# Patient Record
Sex: Female | Born: 1945 | Race: Black or African American | Hispanic: No | Marital: Married | Smoking: Never smoker
Health system: Southern US, Community
[De-identification: ages and names within clinical notes are randomized; demographics above are authoritative.]

## PROBLEM LIST (undated history)

## (undated) DIAGNOSIS — I10 Essential (primary) hypertension: Secondary | ICD-10-CM

## (undated) DIAGNOSIS — D649 Anemia, unspecified: Secondary | ICD-10-CM

## (undated) DIAGNOSIS — E7800 Pure hypercholesterolemia, unspecified: Secondary | ICD-10-CM

## (undated) DIAGNOSIS — C189 Malignant neoplasm of colon, unspecified: Secondary | ICD-10-CM

## (undated) HISTORY — PX: TONSILLECTOMY: SUR1361

## (undated) HISTORY — PX: TUBAL LIGATION: SHX77

---

## 1999-05-05 ENCOUNTER — Other Ambulatory Visit: Admission: RE | Admit: 1999-05-05 | Discharge: 1999-05-05 | Payer: Self-pay | Admitting: Obstetrics and Gynecology

## 2004-04-19 ENCOUNTER — Emergency Department (HOSPITAL_COMMUNITY): Admission: EM | Admit: 2004-04-19 | Discharge: 2004-04-19 | Payer: Self-pay | Admitting: Emergency Medicine

## 2009-02-11 ENCOUNTER — Emergency Department (HOSPITAL_COMMUNITY): Admission: EM | Admit: 2009-02-11 | Discharge: 2009-02-11 | Payer: Self-pay | Admitting: Emergency Medicine

## 2010-02-12 ENCOUNTER — Encounter: Admission: RE | Admit: 2010-02-12 | Discharge: 2010-02-12 | Payer: Self-pay | Admitting: Internal Medicine

## 2010-08-17 ENCOUNTER — Encounter: Payer: Self-pay | Admitting: Internal Medicine

## 2011-03-26 ENCOUNTER — Other Ambulatory Visit (HOSPITAL_COMMUNITY)
Admission: RE | Admit: 2011-03-26 | Discharge: 2011-03-26 | Disposition: A | Discharge status: Home / Self Care | Payer: BC Managed Care – PPO | Source: Ambulatory Visit | Attending: Obstetrics and Gynecology | Admitting: Obstetrics and Gynecology

## 2011-03-26 DIAGNOSIS — Z01419 Encounter for gynecological examination (general) (routine) without abnormal findings: Secondary | ICD-10-CM | POA: Insufficient documentation

## 2011-04-03 ENCOUNTER — Other Ambulatory Visit: Payer: Self-pay | Admitting: Obstetrics and Gynecology

## 2011-04-03 DIAGNOSIS — Z1231 Encounter for screening mammogram for malignant neoplasm of breast: Secondary | ICD-10-CM

## 2011-04-29 ENCOUNTER — Ambulatory Visit
Admission: RE | Admit: 2011-04-29 | Discharge: 2011-04-29 | Disposition: A | Discharge status: Home / Self Care | Payer: BC Managed Care – PPO | Source: Ambulatory Visit | Attending: Obstetrics and Gynecology | Admitting: Obstetrics and Gynecology

## 2011-04-29 DIAGNOSIS — Z1231 Encounter for screening mammogram for malignant neoplasm of breast: Secondary | ICD-10-CM

## 2012-12-28 ENCOUNTER — Emergency Department (HOSPITAL_COMMUNITY): Payer: Self-pay

## 2012-12-28 ENCOUNTER — Emergency Department (HOSPITAL_COMMUNITY)
Admission: EM | Admit: 2012-12-28 | Discharge: 2012-12-28 | Disposition: A | Discharge status: Home / Self Care | Payer: Self-pay | Attending: Emergency Medicine | Admitting: Emergency Medicine

## 2012-12-28 ENCOUNTER — Encounter (HOSPITAL_COMMUNITY): Payer: Self-pay | Admitting: Emergency Medicine

## 2012-12-28 DIAGNOSIS — Y9389 Activity, other specified: Secondary | ICD-10-CM | POA: Insufficient documentation

## 2012-12-28 DIAGNOSIS — W2203XA Walked into furniture, initial encounter: Secondary | ICD-10-CM | POA: Insufficient documentation

## 2012-12-28 DIAGNOSIS — M79672 Pain in left foot: Secondary | ICD-10-CM

## 2012-12-28 DIAGNOSIS — Y929 Unspecified place or not applicable: Secondary | ICD-10-CM | POA: Insufficient documentation

## 2012-12-28 DIAGNOSIS — S8990XA Unspecified injury of unspecified lower leg, initial encounter: Secondary | ICD-10-CM | POA: Insufficient documentation

## 2012-12-28 DIAGNOSIS — I10 Essential (primary) hypertension: Secondary | ICD-10-CM | POA: Insufficient documentation

## 2012-12-28 DIAGNOSIS — N289 Disorder of kidney and ureter, unspecified: Secondary | ICD-10-CM | POA: Insufficient documentation

## 2012-12-28 DIAGNOSIS — R42 Dizziness and giddiness: Secondary | ICD-10-CM | POA: Insufficient documentation

## 2012-12-28 DIAGNOSIS — Z79899 Other long term (current) drug therapy: Secondary | ICD-10-CM | POA: Insufficient documentation

## 2012-12-28 DIAGNOSIS — S99929A Unspecified injury of unspecified foot, initial encounter: Secondary | ICD-10-CM | POA: Insufficient documentation

## 2012-12-28 HISTORY — DX: Essential (primary) hypertension: I10

## 2012-12-28 LAB — CBC WITH DIFFERENTIAL/PLATELET
Basophils Absolute: 0.1 10*3/uL (ref 0.0–0.1)
Basophils Relative: 1 % (ref 0–1)
Eosinophils Absolute: 0.1 10*3/uL (ref 0.0–0.7)
Eosinophils Relative: 1 % (ref 0–5)
HCT: 32.9 % — ABNORMAL LOW (ref 36.0–46.0)
Lymphs Abs: 1.3 10*3/uL (ref 0.7–4.0)
MCH: 28.4 pg (ref 26.0–34.0)
MCHC: 33.4 g/dL (ref 30.0–36.0)
Monocytes Absolute: 0.8 10*3/uL (ref 0.1–1.0)
Neutro Abs: 7.8 10*3/uL — ABNORMAL HIGH (ref 1.7–7.7)
Neutrophils Relative %: 78 % — ABNORMAL HIGH (ref 43–77)
RDW: 13.7 % (ref 11.5–15.5)

## 2012-12-28 LAB — BASIC METABOLIC PANEL
Calcium: 9.3 mg/dL (ref 8.4–10.5)
Chloride: 98 mEq/L (ref 96–112)
GFR calc Af Amer: 20 mL/min — ABNORMAL LOW (ref >90)
GFR calc non Af Amer: 18 mL/min — ABNORMAL LOW (ref >90)
Glucose, Bld: 111 mg/dL — ABNORMAL HIGH (ref 70–99)
Sodium: 136 mEq/L (ref 135–145)

## 2012-12-28 MED ORDER — SODIUM CHLORIDE 0.9 % IV BOLUS (SEPSIS)
1000.0000 mL | Freq: Once | INTRAVENOUS | Status: AC
  Administered 2012-12-28: 1000 mL via INTRAVENOUS

## 2012-12-28 MED ORDER — HYDROCODONE-ACETAMINOPHEN 5-325 MG PO TABS: 1.0000 | ORAL_TABLET | Freq: Four times a day (QID) | ORAL | Status: DC | PRN

## 2012-12-28 NOTE — ED Provider Notes (Signed)
History    This chart was scribed for Benny Lennert, MD by ED Scribe, Burman Nieves. This patient was seen in room [facility] and the patient's care was started at 3:15 PM.   CSN: [id number]  Arrival date & time 12/28/12  1439   First MD Initiated Contact with Patient 12/28/12 1515      Chief Complaint  Patient presents with  . Leg Swelling  . Dizziness    (Consider location/radiation/quality/duration/timing/severity/associated sxs/prior treatment) Patient is a 67 y.o. female presenting with lower extremity pain. The history is provided by the patient (pt complains of pain and swelling both feet). No language interpreter was used.  Foot Pain This is a new problem. The current episode started more than 2 days ago. The problem occurs constantly. The problem has not changed since onset.Pertinent negatives include no chest pain, no abdominal pain and no headaches. Nothing aggravates the symptoms. Nothing relieves the symptoms.   HPI Comments: Lisa Guerra is a 67 y.o. female with h/o HTN who presents to the Emergency Department complaining of moderate bilateral pedal edema since Saturday. Pt reports that she hit her right foot chair resulting in it swelling up. Sunday, pt's left foot started to swell up. She feels like her feet are full fluid. Pt got out of the bathtub around 2 PM today and started to feel dizzy. She believes the dizziness onset is due to not eating anything today. Pt denies fever, chills, cough, nausea, vomiting, diarrhea, SOB, weakness, and any other associated symptoms.   Past Medical History  Diagnosis Date  . Hypertension     History reviewed. No pertinent past surgical history.  No family history on file.  History  Substance Use Topics  . Smoking status: Never Smoker   . Smokeless tobacco: Not on file  . Alcohol Use: No    OB History   Grav Para Term Preterm Abortions TAB SAB Ect Mult Living                  Review of Systems  Constitutional:  Negative for appetite change and fatigue.  HENT: Negative for congestion, sinus pressure and ear discharge.   Eyes: Negative for discharge.  Respiratory: Negative for cough.   Cardiovascular: Negative for chest pain.  Gastrointestinal: Negative for abdominal pain and diarrhea.  Genitourinary: Negative for frequency and hematuria.  Musculoskeletal: Negative for back pain.       Swelling of the feet  Skin: Negative for rash.  Neurological: Positive for dizziness. Negative for seizures and headaches.  Psychiatric/Behavioral: Negative for hallucinations.  All other systems reviewed and are negative.    Allergies  Review of patient's allergies indicates no known allergies.  Home Medications   Current Outpatient Rx  Name  Route  Sig  Dispense  Refill  . amLODipine (NORVASC) 5 MG tablet   Oral   Take 5 mg by mouth daily.         Marland Kitchen lisinopril-hydrochlorothiazide (PRINZIDE,ZESTORETIC) 20-12.5 MG per tablet   Oral   Take 1 tablet by mouth daily.           BP 105/73  Pulse 94  Temp(Src) 97.6 F (36.4 C) (Oral)  Resp 17  Ht 5\' 6"  (1.676 m)  Wt 133 lb (60.328 kg)  BMI 21.48 kg/m2  SpO2 100%  Physical Exam  Nursing note and vitals reviewed. Constitutional: She is oriented to person, place, and time. She appears well-developed.  HENT:  Head: Normocephalic.  Eyes: Conjunctivae and EOM are normal. No scleral  icterus.  Neck: Neck supple. No tracheal deviation present. No thyromegaly present.  Cardiovascular: Normal rate and regular rhythm.  Exam reveals no gallop and no friction rub.   No murmur heard. Pulmonary/Chest: No stridor. She has no wheezes. She has no rales. She exhibits no tenderness.  Abdominal: She exhibits no distension. There is no tenderness. There is no rebound.  Musculoskeletal: Normal range of motion. She exhibits edema and tenderness.  Swelling bilaterally in feet. Left foot tender upon palpation to the dorsal aspect.  Lymphadenopathy:    She has no  cervical adenopathy.  Neurological: She is oriented to person, place, and time. Coordination normal.  Skin: Skin is warm. No rash noted. No erythema.  Psychiatric: She has a normal mood and affect. Her behavior is normal.    ED Course  Procedures (including critical care time) DIAGNOSTIC STUDIES: Oxygen Saturation is 100% on room air, normal by my interpretation.    COORDINATION OF CARE: 3:20 PM Discussed ED treatment with pt and pt agrees.     Labs Reviewed - No data to display No results found.   No diagnosis found.    MDM       .jk The chart was scribed for me under my direct supervision.  I personally performed the history, physical, and medical decision making and all procedures in the evaluation of this patient.Benny Lennert, MD 12/28/12 [id number]

## 2012-12-28 NOTE — ED Notes (Signed)
Pt c/o bilateral pedal edema since Saturday. Pt also c/o dizziness today when getting out of the bathtub at 1400. Denies dizziness at this time. Denies cp/sob. nad noted.

## 2012-12-28 NOTE — ED Notes (Addendum)
Pt reports accidentally kicked a chair with the side of her r foot Friday.  Reports has been walking on crutches and noticed her left foot started swelling also. Left foot appears swollen on the top of her foot , the top of r foot and r ankle swollen.  Pedal pulses present.  Also reports one episode of dizziness this morning after getting out of the shower.  Denies dizziness now.

## 2014-08-27 ENCOUNTER — Other Ambulatory Visit: Payer: Self-pay | Admitting: Internal Medicine

## 2014-08-27 DIAGNOSIS — Z1231 Encounter for screening mammogram for malignant neoplasm of breast: Secondary | ICD-10-CM

## 2014-09-11 ENCOUNTER — Ambulatory Visit: Payer: Self-pay

## 2014-09-19 ENCOUNTER — Ambulatory Visit: Payer: Self-pay

## 2014-09-26 ENCOUNTER — Ambulatory Visit: Payer: Self-pay

## 2015-10-03 ENCOUNTER — Other Ambulatory Visit: Payer: Self-pay | Admitting: Gastroenterology

## 2015-10-09 ENCOUNTER — Encounter (HOSPITAL_COMMUNITY): Payer: Self-pay | Admitting: *Deleted

## 2015-10-14 ENCOUNTER — Encounter (HOSPITAL_COMMUNITY): Payer: Self-pay | Admitting: Certified Registered Nurse Anesthetist

## 2015-10-15 ENCOUNTER — Ambulatory Visit (HOSPITAL_COMMUNITY): Admission: RE | Admit: 2015-10-15 | Payer: Medicare Other | Source: Ambulatory Visit | Admitting: Gastroenterology

## 2015-10-15 SURGERY — COLONOSCOPY WITH PROPOFOL
Anesthesia: Monitor Anesthesia Care

## 2015-10-15 MED ORDER — LIDOCAINE HCL (CARDIAC) 20 MG/ML IV SOLN
INTRAVENOUS | Status: AC
  Filled 2015-10-15: qty 5

## 2015-10-15 MED ORDER — ONDANSETRON HCL 4 MG/2ML IJ SOLN
INTRAMUSCULAR | Status: AC
  Filled 2015-10-15: qty 2

## 2015-10-15 MED ORDER — PROPOFOL 10 MG/ML IV BOLUS
INTRAVENOUS | Status: AC
  Filled 2015-10-15: qty 60

## 2015-10-15 NOTE — Anesthesia Preprocedure Evaluation (Deleted)
Anesthesia Evaluation  Patient identified by MRN, date of birth, ID band Patient awake    Reviewed: Allergy & Precautions, NPO status , Patient's Chart, lab work & pertinent test results  Airway        Dental   Pulmonary neg pulmonary ROS,           Cardiovascular hypertension, Pt. on medications      Neuro/Psych negative neurological ROS  negative psych ROS   GI/Hepatic negative GI ROS, Neg liver ROS,   Endo/Other  negative endocrine ROS  Renal/GU negative Renal ROS  negative genitourinary   Musculoskeletal negative musculoskeletal ROS (+)   Abdominal   Peds negative pediatric ROS (+)  Hematology negative hematology ROS (+)   Anesthesia Other Findings   Reproductive/Obstetrics negative OB ROS                             Lab Results  Component Value Date   WBC 10.0 12/28/2012   HGB 11.0* 12/28/2012   HCT 32.9* 12/28/2012   MCV 85.0 12/28/2012   PLT 343 12/28/2012   Lab Results  Component Value Date   CREATININE 2.69* 12/28/2012   BUN 56* 12/28/2012   NA 136 12/28/2012   K 3.7 12/28/2012   CL 98 12/28/2012   CO2 24 12/28/2012     Anesthesia Physical Anesthesia Plan  ASA: II  Anesthesia Plan: MAC   Post-op Pain Management:    Induction: Intravenous  Airway Management Planned: Natural Airway and Simple Face Mask  Additional Equipment:   Intra-op Plan:   Post-operative Plan:   Informed Consent: I have reviewed the patients History and Physical, chart, labs and discussed the procedure including the risks, benefits and alternatives for the proposed anesthesia with the patient or authorized representative who has indicated his/her understanding and acceptance.   Dental advisory given  Plan Discussed with: CRNA  Anesthesia Plan Comments:         Anesthesia Quick Evaluation

## 2016-02-05 ENCOUNTER — Other Ambulatory Visit: Payer: Self-pay | Admitting: Gastroenterology

## 2016-03-23 ENCOUNTER — Other Ambulatory Visit: Payer: Self-pay | Admitting: Gastroenterology

## 2016-03-23 NOTE — Progress Notes (Signed)
Attempted to contact pt x 4 times, pt did not call back for pre op medical history and instructions

## 2016-03-24 ENCOUNTER — Ambulatory Visit (HOSPITAL_COMMUNITY): Admission: RE | Admit: 2016-03-24 | Payer: Medicare Other | Source: Ambulatory Visit | Admitting: Gastroenterology

## 2016-03-24 ENCOUNTER — Encounter (HOSPITAL_COMMUNITY): Admission: RE | Payer: Self-pay | Source: Ambulatory Visit

## 2016-03-24 ENCOUNTER — Encounter (HOSPITAL_COMMUNITY): Payer: Self-pay | Admitting: Anesthesiology

## 2016-03-24 SURGERY — COLONOSCOPY WITH PROPOFOL
Anesthesia: Monitor Anesthesia Care

## 2016-03-24 NOTE — Anesthesia Preprocedure Evaluation (Deleted)
Anesthesia Evaluation  Patient identified by MRN, date of birth, ID band Patient awake    Reviewed: Allergy & Precautions, NPO status , Patient's Chart, lab work & pertinent test results  Airway        Dental   Pulmonary neg pulmonary ROS,           Cardiovascular hypertension, Pt. on medications      Neuro/Psych negative neurological ROS  negative psych ROS   GI/Hepatic negative GI ROS, Neg liver ROS,   Endo/Other  negative endocrine ROS  Renal/GU negative Renal ROS  negative genitourinary   Musculoskeletal negative musculoskeletal ROS (+)   Abdominal   Peds negative pediatric ROS (+)  Hematology negative hematology ROS (+)   Anesthesia Other Findings   Reproductive/Obstetrics negative OB ROS                             Lab Results  Component Value Date   WBC 10.0 12/28/2012   HGB 11.0 (L) 12/28/2012   HCT 32.9 (L) 12/28/2012   MCV 85.0 12/28/2012   PLT 343 12/28/2012   Lab Results  Component Value Date   CREATININE 2.69 (H) 12/28/2012   BUN 56 (H) 12/28/2012   NA 136 12/28/2012   K 3.7 12/28/2012   CL 98 12/28/2012   CO2 24 12/28/2012     Anesthesia Physical  Anesthesia Plan  ASA: II  Anesthesia Plan: MAC   Post-op Pain Management:    Induction: Intravenous  Airway Management Planned: Simple Face Mask  Additional Equipment:   Intra-op Plan:   Post-operative Plan:   Informed Consent: I have reviewed the patients History and Physical, chart, labs and discussed the procedure including the risks, benefits and alternatives for the proposed anesthesia with the patient or authorized representative who has indicated his/her understanding and acceptance.   Dental advisory given  Plan Discussed with: CRNA  Anesthesia Plan Comments:         Anesthesia Quick Evaluation

## 2016-05-05 ENCOUNTER — Ambulatory Visit (HOSPITAL_COMMUNITY): Admission: RE | Admit: 2016-05-05 | Payer: Medicare Other | Source: Ambulatory Visit | Admitting: Gastroenterology

## 2016-05-05 ENCOUNTER — Encounter (HOSPITAL_COMMUNITY): Admission: RE | Payer: Self-pay | Source: Ambulatory Visit

## 2016-05-05 SURGERY — COLONOSCOPY WITH PROPOFOL
Anesthesia: Monitor Anesthesia Care

## 2016-05-05 MED ORDER — PROPOFOL 10 MG/ML IV BOLUS
INTRAVENOUS | Status: AC
  Filled 2016-05-05: qty 40

## 2016-05-05 MED ORDER — ONDANSETRON HCL 4 MG/2ML IJ SOLN
INTRAMUSCULAR | Status: AC
  Filled 2016-05-05: qty 2

## 2016-07-10 ENCOUNTER — Other Ambulatory Visit: Payer: Self-pay | Admitting: Internal Medicine

## 2016-07-10 DIAGNOSIS — Z1231 Encounter for screening mammogram for malignant neoplasm of breast: Secondary | ICD-10-CM

## 2016-08-17 ENCOUNTER — Ambulatory Visit: Payer: Medicare Other

## 2016-08-26 ENCOUNTER — Other Ambulatory Visit: Payer: Self-pay | Admitting: Gastroenterology

## 2016-08-26 ENCOUNTER — Ambulatory Visit
Admission: RE | Admit: 2016-08-26 | Discharge: 2016-08-26 | Disposition: A | Discharge status: Home / Self Care | Payer: Medicare Other | Source: Ambulatory Visit | Attending: Internal Medicine | Admitting: Internal Medicine

## 2016-08-26 DIAGNOSIS — Z1231 Encounter for screening mammogram for malignant neoplasm of breast: Secondary | ICD-10-CM

## 2016-09-03 ENCOUNTER — Encounter (HOSPITAL_COMMUNITY): Payer: Self-pay

## 2016-09-07 ENCOUNTER — Ambulatory Visit (HOSPITAL_COMMUNITY): Payer: Medicare Other | Admitting: Anesthesiology

## 2016-09-07 ENCOUNTER — Encounter (HOSPITAL_COMMUNITY)
Admission: RE | Disposition: A | Discharge status: Home / Self Care | Payer: Self-pay | Source: Ambulatory Visit | Attending: Gastroenterology

## 2016-09-07 ENCOUNTER — Other Ambulatory Visit (HOSPITAL_COMMUNITY): Payer: Self-pay | Admitting: Gastroenterology

## 2016-09-07 ENCOUNTER — Ambulatory Visit (HOSPITAL_COMMUNITY)
Admission: RE | Admit: 2016-09-07 | Discharge: 2016-09-07 | Disposition: A | Discharge status: Home / Self Care | Payer: Medicare Other | Source: Ambulatory Visit | Attending: Gastroenterology | Admitting: Gastroenterology

## 2016-09-07 ENCOUNTER — Encounter (HOSPITAL_COMMUNITY): Payer: Self-pay

## 2016-09-07 DIAGNOSIS — D125 Benign neoplasm of sigmoid colon: Secondary | ICD-10-CM | POA: Diagnosis not present

## 2016-09-07 DIAGNOSIS — D124 Benign neoplasm of descending colon: Secondary | ICD-10-CM | POA: Insufficient documentation

## 2016-09-07 DIAGNOSIS — Z681 Body mass index (BMI) 19 or less, adult: Secondary | ICD-10-CM | POA: Insufficient documentation

## 2016-09-07 DIAGNOSIS — C180 Malignant neoplasm of cecum: Secondary | ICD-10-CM | POA: Insufficient documentation

## 2016-09-07 DIAGNOSIS — I10 Essential (primary) hypertension: Secondary | ICD-10-CM | POA: Diagnosis not present

## 2016-09-07 DIAGNOSIS — K921 Melena: Secondary | ICD-10-CM | POA: Insufficient documentation

## 2016-09-07 DIAGNOSIS — Z800 Family history of malignant neoplasm of digestive organs: Secondary | ICD-10-CM | POA: Diagnosis not present

## 2016-09-07 DIAGNOSIS — R634 Abnormal weight loss: Secondary | ICD-10-CM | POA: Diagnosis not present

## 2016-09-07 DIAGNOSIS — Z79899 Other long term (current) drug therapy: Secondary | ICD-10-CM | POA: Insufficient documentation

## 2016-09-07 DIAGNOSIS — D500 Iron deficiency anemia secondary to blood loss (chronic): Secondary | ICD-10-CM | POA: Insufficient documentation

## 2016-09-07 DIAGNOSIS — C187 Malignant neoplasm of sigmoid colon: Secondary | ICD-10-CM

## 2016-09-07 HISTORY — DX: Anemia, unspecified: D64.9

## 2016-09-07 HISTORY — PX: COLONOSCOPY: SHX5424

## 2016-09-07 LAB — COMPREHENSIVE METABOLIC PANEL
ALBUMIN: 2.8 g/dL — AB (ref 3.5–5.0)
ALT: 19 U/L (ref 14–54)
AST: 30 U/L (ref 15–41)
Alkaline Phosphatase: 156 U/L — ABNORMAL HIGH (ref 38–126)
Anion gap: 8 (ref 5–15)
BUN: 33 mg/dL — AB (ref 6–20)
CHLORIDE: 105 mmol/L (ref 101–111)
CO2: 24 mmol/L (ref 22–32)
Calcium: 8.5 mg/dL — ABNORMAL LOW (ref 8.9–10.3)
Creatinine, Ser: 1.65 mg/dL — ABNORMAL HIGH (ref 0.44–1.00)
GFR calc Af Amer: 36 mL/min — ABNORMAL LOW (ref >60)
GFR calc non Af Amer: 31 mL/min — ABNORMAL LOW (ref >60)
GLUCOSE: 85 mg/dL (ref 65–99)
POTASSIUM: 4.7 mmol/L (ref 3.5–5.1)
Sodium: 137 mmol/L (ref 135–145)
Total Bilirubin: 0.8 mg/dL (ref 0.3–1.2)
Total Protein: 6.9 g/dL (ref 6.5–8.1)

## 2016-09-07 SURGERY — COLONOSCOPY
Anesthesia: Monitor Anesthesia Care

## 2016-09-07 MED ORDER — SPOT INK MARKER SYRINGE KIT
PACK | SUBMUCOSAL | Status: AC
  Filled 2016-09-07: qty 5

## 2016-09-07 MED ORDER — SODIUM CHLORIDE 0.9 % IV SOLN: INTRAVENOUS | Status: DC

## 2016-09-07 MED ORDER — PROPOFOL 500 MG/50ML IV EMUL
INTRAVENOUS | Status: DC | PRN
  Administered 2016-09-07: 100 ug/kg/min via INTRAVENOUS

## 2016-09-07 MED ORDER — LACTATED RINGERS IV SOLN
INTRAVENOUS | Status: DC
  Administered 2016-09-07: 10:00:00 via INTRAVENOUS

## 2016-09-07 MED ORDER — PROPOFOL 10 MG/ML IV BOLUS
INTRAVENOUS | Status: AC
  Filled 2016-09-07: qty 40

## 2016-09-07 MED ORDER — PROPOFOL 500 MG/50ML IV EMUL
INTRAVENOUS | Status: DC | PRN
  Administered 2016-09-07: 20 mg via INTRAVENOUS
  Administered 2016-09-07: 10 mg via INTRAVENOUS
  Administered 2016-09-07: 50 mg via INTRAVENOUS

## 2016-09-07 NOTE — H&P (Signed)
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 Unintentional weight loss. Brother was diagnosed with colon cancer at age 69. 08/26/2016 white blood cell count was 10,600, hemoglobin was 7.2 g, platelet count was 672,000, serum ferritin was 28.4 ng/mL  History: The patient is a 71 year old female born [birth year]. She is scheduled to undergo screening-diagnostic colonoscopy to evaluate iron deficiency anemia associated with chronic small-volume hematochezia and unintentional weight loss. Her brother was diagnosed with colon cancer at age 4.  Past medical history: Hypertension. Iron deficiency anemia. Cesarean sections.  Exam: The patient is alert and lying comfortably on the endoscopy stretcher. Abdomen soft and nontender to palpation. Lungs are clear to auscultation. Cardiac exam reveals a regular rhythm.  Plan: Proceed with screening colonoscopy

## 2016-09-07 NOTE — Anesthesia Postprocedure Evaluation (Signed)
Anesthesia Post Note  Patient: Lisa Guerra  Procedure(s) Performed: Procedure(s) (LRB): COLONOSCOPY (N/A)  Patient location during evaluation: PACU Anesthesia Type: MAC Level of consciousness: awake and alert Pain management: pain level controlled Vital Signs Assessment: post-procedure vital signs reviewed and stable Respiratory status: spontaneous breathing, nonlabored ventilation, respiratory function stable and patient connected to nasal cannula oxygen Cardiovascular status: stable and blood pressure returned to baseline Anesthetic complications: no       Last Vitals:  Vitals:   09/07/16 0907 09/07/16 1030  BP: 105/63 109/80  Pulse: 77 75  Resp: 12 (!) 22  Temp: 36.6 C 36.4 C    Last Pain:  Vitals:   09/07/16 1030  TempSrc: Oral                 Fahed Morten S

## 2016-09-07 NOTE — Transfer of Care (Signed)
Immediate Anesthesia Transfer of Care Note  Patient: Lisa Guerra  Procedure(s) Performed: Procedure(s): COLONOSCOPY (N/A)  Patient Location: PACU  Anesthesia Type:MAC  Level of Consciousness: awake, alert  and oriented  Airway & Oxygen Therapy: Patient Spontanous Breathing and Patient connected to face mask oxygen  Post-op Assessment: Report given to RN and Post -op Vital signs reviewed and stable  Post vital signs: Reviewed and stable  Last Vitals:  Vitals:   09/07/16 0907  BP: 105/63  Pulse: 77  Resp: 12  Temp: 36.6 C    Last Pain:  Vitals:   09/07/16 0907  TempSrc: Oral         Complications: No apparent anesthesia complications

## 2016-09-07 NOTE — Op Note (Signed)
[facility] Patient Name: Lisa Guerra Procedure Date: 09/07/2016 MRN: [id number] Attending MD: Garlan Fair , MD Date of Birth: 1945-09-05 CSN: [id number] Age: 71 Admit Type: Outpatient Procedure:                Colonoscopy Indications:              Iron deficiency anemia secondary to chronic blood                            loss: 08/26/2016 white blood cell count was 10,600,                            hemoglobin was 7.2 g, platelet count was 672,000,                            serum ferritin was 28.4 ng/mL Providers:                Garlan Fair, MD, Hilma Favors, RN, Alfonso Patten, Technician, Rosario Adie, CRNA Referring MD:              Medicines:                Propofol per Anesthesia Complications:            No immediate complications. Estimated Blood Loss:     Estimated blood loss was minimal. Procedure:                Pre-Anesthesia Assessment:                           - Prior to the procedure, a History and Physical                            was performed, and patient medications and                            allergies were reviewed. The patient's tolerance of                            previous anesthesia was also reviewed. The risks                            and benefits of the procedure and the sedation                            options and risks were discussed with the patient.                            All questions were answered, and informed consent                            was obtained. Prior Anticoagulants: The patient has  taken no previous anticoagulant or antiplatelet                            agents. ASA Grade Assessment: III - A patient with                            severe systemic disease. After reviewing the risks                            and benefits, the patient was deemed in                            satisfactory condition to undergo the procedure.                  After obtaining informed consent, the colonoscope                            was passed under direct vision. Throughout the                            procedure, the patient's blood pressure, pulse, and                            oxygen saturations were monitored continuously. The                            [id number]) scope was introduced through                            the anus and advanced to the the cecum, identified                            by appendiceal orifice and ileocecal valve. The                            colonoscopy was performed without difficulty. The                            patient tolerated the procedure well. The quality                            of the bowel preparation was good. The rectum was                            photographed. Scope In: 9:43:55 AM Scope Out: 10:16:59 AM Scope Withdrawal Time: 0 hours 28 minutes 48 seconds  Total Procedure Duration: 0 hours 33 minutes 4 seconds  Findings:      The perianal and digital rectal examinations were normal.      Two sessile polyps were found in the descending colon. The polyps were 5       mm in size. These polyps were removed with a cold snare. Resection and       retrieval were complete.      At 20 cm from the anal  verge, a 5 cm multilobulated ulcerated tumor       filling the lumen was biopsied. The tumor site was tattooed.      In the cecum?"ascending colon was a large ulcerated tumor filling the       lumen. The tumor was biopsied. The tumor site was tattooed.      The exam was otherwise normal. Impression:               - Two 5 mm polyps in the descending colon, removed                            with a cold snare. Resected and retrieved. 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 A large multilobulated                            ulcerated polypoid lesion was present at the                             rectosigmoid junction, 20 cm from the anal verge. Moderate Sedation:      N/A- Per Anesthesia Care Recommendation:           - Patient has a contact number available for                            emergencies. The signs and symptoms of potential                            delayed complications were discussed with the                            patient. Return to normal activities tomorrow.                            Written discharge instructions were provided to the                            patient. I will check a complete metabolic profile,                            CEA, and schedule the patient for a CT scan of the                            chest?"abdomen?"pelvis.                           - Refer to a colo-rectal surgeon at appointment to                            be scheduled to remove the large tumor in the  cecum?"ascending colon and the large tumor at the                            rectosigmoid junction, 20 cm from the anal verge.                           - Resume previous diet.                           - Continue present medications.                           - Repeat colonoscopy is recommended. The                            colonoscopy date will be determined after pathology                            results from today's exam become available for                            review. Procedure Code(s):        --- Professional ---                           [phone], Colonoscopy, flexible; with removal of                            tumor(s), polyp(s), or other lesion(s) by snare                            technique Diagnosis Code(s):        --- Professional ---                           D12.4, Benign neoplasm of descending colon                           D50.0, Iron deficiency anemia secondary to blood                            loss (chronic) CPT copyright 2016 [employer]. All rights reserved. The codes  documented in this report are preliminary and upon coder review may  be revised to meet current compliance requirements. Earle Gell, MD Garlan Fair, MD 09/07/2016 10:32:57 AM This report has been signed electronically. Number of Addenda: 0

## 2016-09-07 NOTE — Discharge Instructions (Signed)

## 2016-09-07 NOTE — Anesthesia Preprocedure Evaluation (Addendum)
Anesthesia Evaluation  Patient identified by MRN, date of birth, ID band Patient awake    Reviewed: Allergy & Precautions, NPO status , Patient's Chart, lab work & pertinent test results  Airway Mallampati: II  TM Distance: >3 FB Neck ROM: Full    Dental no notable dental hx.    Pulmonary neg pulmonary ROS,    Pulmonary exam normal breath sounds clear to auscultation       Cardiovascular hypertension, Normal cardiovascular exam Rhythm:Regular Rate:Normal     Neuro/Psych negative neurological ROS  negative psych ROS   GI/Hepatic negative GI ROS, Neg liver ROS,   Endo/Other  negative endocrine ROS  Renal/GU negative Renal ROS  negative genitourinary   Musculoskeletal negative musculoskeletal ROS (+)   Abdominal   Peds negative pediatric ROS (+)  Hematology negative hematology ROS (+)   Anesthesia Other Findings   Reproductive/Obstetrics negative OB ROS                             Anesthesia Physical Anesthesia Plan  ASA: II  Anesthesia Plan: MAC   Post-op Pain Management:    Induction: Intravenous  Airway Management Planned: Simple Face Mask  Additional Equipment:   Intra-op Plan:   Post-operative Plan:   Informed Consent: I have reviewed the patients History and Physical, chart, labs and discussed the procedure including the risks, benefits and alternatives for the proposed anesthesia with the patient or authorized representative who has indicated his/her understanding and acceptance.   Dental advisory given  Plan Discussed with: CRNA and Surgeon  Anesthesia Plan Comments:         Anesthesia Quick Evaluation  

## 2016-09-08 LAB — CEA: CEA: 1142 ng/mL — AB (ref 0.0–4.7)

## 2016-09-10 ENCOUNTER — Ambulatory Visit (HOSPITAL_COMMUNITY)
Admission: RE | Admit: 2016-09-10 | Discharge: 2016-09-10 | Disposition: A | Discharge status: Home / Self Care | Payer: Medicare Other | Source: Ambulatory Visit | Attending: Gastroenterology | Admitting: Gastroenterology

## 2016-09-10 DIAGNOSIS — R590 Localized enlarged lymph nodes: Secondary | ICD-10-CM | POA: Diagnosis not present

## 2016-09-10 DIAGNOSIS — C7800 Secondary malignant neoplasm of unspecified lung: Secondary | ICD-10-CM | POA: Insufficient documentation

## 2016-09-10 DIAGNOSIS — C801 Malignant (primary) neoplasm, unspecified: Secondary | ICD-10-CM | POA: Diagnosis not present

## 2016-09-10 DIAGNOSIS — D259 Leiomyoma of uterus, unspecified: Secondary | ICD-10-CM | POA: Diagnosis not present

## 2016-09-10 DIAGNOSIS — C187 Malignant neoplasm of sigmoid colon: Secondary | ICD-10-CM

## 2016-09-10 DIAGNOSIS — C787 Secondary malignant neoplasm of liver and intrahepatic bile duct: Secondary | ICD-10-CM | POA: Insufficient documentation

## 2016-09-10 DIAGNOSIS — K639 Disease of intestine, unspecified: Secondary | ICD-10-CM | POA: Diagnosis not present

## 2016-09-10 MED ORDER — IOPAMIDOL (ISOVUE-300) INJECTION 61%
INTRAVENOUS | Status: AC
  Filled 2016-09-10: qty 100

## 2016-09-10 MED ORDER — SODIUM CHLORIDE 0.9 % IJ SOLN
INTRAMUSCULAR | Status: AC
  Filled 2016-09-10: qty 50

## 2016-09-10 MED ORDER — IOPAMIDOL (ISOVUE-300) INJECTION 61%
30.0000 mL | Freq: Once | INTRAVENOUS | Status: AC | PRN
  Administered 2016-09-10: 30 mL via ORAL

## 2016-09-10 MED ORDER — IOPAMIDOL (ISOVUE-300) INJECTION 61%
INTRAVENOUS | Status: AC
  Administered 2016-09-10: 30 mL via ORAL
  Filled 2016-09-10: qty 30

## 2016-09-10 MED ORDER — IOPAMIDOL (ISOVUE-300) INJECTION 61%
100.0000 mL | Freq: Once | INTRAVENOUS | Status: AC | PRN
  Administered 2016-09-10: 80 mL via INTRAVENOUS

## 2016-09-11 ENCOUNTER — Telehealth: Payer: Self-pay | Admitting: Oncology

## 2016-09-11 ENCOUNTER — Encounter (HOSPITAL_COMMUNITY): Payer: Self-pay | Admitting: Gastroenterology

## 2016-09-11 NOTE — Telephone Encounter (Signed)
Pt has been scheduled to see Dr. Benay Spice on 2/19 at 830am. She is aware of the appt. She states that she has to drop her grandkids off at 745am and will come straight to the appt. Per Lavella Lemons as long as the pt arrive by 9am she should be fine. Cancelled appt scheduled at [facility] for hematology due to pt's current cancer dx.

## 2016-09-14 ENCOUNTER — Telehealth: Payer: Self-pay | Admitting: Oncology

## 2016-09-14 ENCOUNTER — Ambulatory Visit (HOSPITAL_BASED_OUTPATIENT_CLINIC_OR_DEPARTMENT_OTHER): Payer: Medicare Other | Admitting: Oncology

## 2016-09-14 ENCOUNTER — Telehealth: Payer: Self-pay | Admitting: *Deleted

## 2016-09-14 ENCOUNTER — Ambulatory Visit (HOSPITAL_COMMUNITY): Payer: Medicare Other | Admitting: Oncology

## 2016-09-14 ENCOUNTER — Ambulatory Visit (HOSPITAL_BASED_OUTPATIENT_CLINIC_OR_DEPARTMENT_OTHER): Payer: Medicare Other

## 2016-09-14 VITALS — BP 129/85 | HR 92 | Temp 97.9°F | Resp 19 | Ht 66.0 in | Wt 104.0 lb

## 2016-09-14 DIAGNOSIS — C189 Malignant neoplasm of colon, unspecified: Secondary | ICD-10-CM | POA: Insufficient documentation

## 2016-09-14 DIAGNOSIS — C7800 Secondary malignant neoplasm of unspecified lung: Secondary | ICD-10-CM | POA: Diagnosis not present

## 2016-09-14 DIAGNOSIS — D630 Anemia in neoplastic disease: Secondary | ICD-10-CM | POA: Diagnosis not present

## 2016-09-14 DIAGNOSIS — C180 Malignant neoplasm of cecum: Secondary | ICD-10-CM | POA: Diagnosis not present

## 2016-09-14 DIAGNOSIS — C182 Malignant neoplasm of ascending colon: Secondary | ICD-10-CM

## 2016-09-14 DIAGNOSIS — N189 Chronic kidney disease, unspecified: Secondary | ICD-10-CM | POA: Diagnosis not present

## 2016-09-14 DIAGNOSIS — C787 Secondary malignant neoplasm of liver and intrahepatic bile duct: Secondary | ICD-10-CM | POA: Diagnosis not present

## 2016-09-14 LAB — CBC WITH DIFFERENTIAL/PLATELET
BASO%: 0.6 % (ref 0.0–2.0)
BASOS ABS: 0.1 10*3/uL (ref 0.0–0.1)
EOS ABS: 0.1 10*3/uL (ref 0.0–0.5)
EOS%: 1.2 % (ref 0.0–7.0)
HEMATOCRIT: 23.5 % — AB (ref 34.8–46.6)
HEMOGLOBIN: 7.3 g/dL — AB (ref 11.6–15.9)
LYMPH%: 15.2 % (ref 14.0–49.7)
MCH: 23.5 pg — AB (ref 25.1–34.0)
MCHC: 31.1 g/dL — ABNORMAL LOW (ref 31.5–36.0)
MCV: 75.7 fL — AB (ref 79.5–101.0)
MONO#: 0.9 10*3/uL (ref 0.1–0.9)
MONO%: 7.8 % (ref 0.0–14.0)
NEUT%: 75.2 % (ref 38.4–76.8)
NEUTROS ABS: 8.5 10*3/uL — AB (ref 1.5–6.5)
Platelets: 597 10*3/uL — ABNORMAL HIGH (ref 145–400)
RBC: 3.1 10*6/uL — ABNORMAL LOW (ref 3.70–5.45)
RDW: 17.7 % — AB (ref 11.2–14.5)
WBC: 11.3 10*3/uL — AB (ref 3.9–10.3)
lymph#: 1.7 10*3/uL (ref 0.9–3.3)

## 2016-09-14 LAB — CEA (IN HOUSE-CHCC)

## 2016-09-14 LAB — COMPREHENSIVE METABOLIC PANEL
ALBUMIN: 2.8 g/dL — AB (ref 3.5–5.0)
ALK PHOS: 219 U/L — AB (ref 40–150)
ALT: 24 U/L (ref 0–55)
AST: 31 U/L (ref 5–34)
Anion Gap: 10 mEq/L (ref 3–11)
BILIRUBIN TOTAL: 0.35 mg/dL (ref 0.20–1.20)
BUN: 21.4 mg/dL (ref 7.0–26.0)
CALCIUM: 9.5 mg/dL (ref 8.4–10.4)
CO2: 22 mEq/L (ref 22–29)
Chloride: 107 mEq/L (ref 98–109)
Creatinine: 1.3 mg/dL — ABNORMAL HIGH (ref 0.6–1.1)
EGFR: 50 mL/min/{1.73_m2} — AB (ref >90)
GLUCOSE: 87 mg/dL (ref 70–140)
POTASSIUM: 4.6 meq/L (ref 3.5–5.1)
Sodium: 139 mEq/L (ref 136–145)
TOTAL PROTEIN: 7.3 g/dL (ref 6.4–8.3)

## 2016-09-14 LAB — PROTIME-INR
INR: 1.2 — ABNORMAL LOW (ref 2.00–3.50)
PROTIME: 14.4 s — AB (ref 10.6–13.4)

## 2016-09-14 MED ORDER — FERROUS SULFATE 325 (65 FE) MG PO TABS: 325.0000 mg | ORAL_TABLET | Freq: Three times a day (TID) | ORAL | Status: AC

## 2016-09-14 NOTE — Telephone Encounter (Signed)
Called pt with instructions to increase ferrous sulfate to TID with meals. She voiced understanding.

## 2016-09-14 NOTE — Progress Notes (Signed)
[facility] Patient Consult   Referring MD: Lindell Semmel 71 y.o.  [birth year]    Reason for Referral: Colon cancer   HPI: Lisa Guerra saw Dr. Lysle Rubens on 07/10/2016. A CBC was remarkable for a hemoglobin of 7.4, hematocrit 23.2%, and MCV 77.4. The platelet count returned elevated at 629 with a white count of 12.7. She was referred to Dr. Wynetta Emery and into colonoscopy on 09/07/2016. 2 polyps were removed from the descending colon. A large mass was noted at the cecum. A multilobulated ulcerated polypoid lesion was present at 20 cm from anal verge. The 2 large masses were biopsied. The pathology [id number]) revealed tubular adenomas at the descending colon, one with high-grade dysplasia. The mass at 20 cm returned as a tubular adenoma with at least high-grade dysplasia. The cecal mass returned as adenocarcinoma.  She was referred for CTs of the chest, abdomen, and pelvis on 09/10/2016. Numerous small pulmonary nodules are consistent with metastases. Diffuse hepatic metastatic disease including a large conglomerate of masses in the right liver measuring 11.5 x 9.2 cm. Bulky lymphadenopathy surrounds the pancreas. Right adrenal gland metastasis. A large mass is noted near the hepatic flexure with invasion of the omental fat and abutment of the gallbladder. Bulky associated adenopathy and bulky retroperitoneal adenopathy. A rectoigmoid lesion is noted measuring 2.5 x 2.5 cm. Large degenerated fibroid. The ovaries appear normal.  She is taking iron once daily. She reports developing "hallucinations "when she increase the iron to twice daily.    Past Medical History:  Diagnosis Date  . Anemia-Iron deficiency  December 2017   . Hypertension     .  Chronic renal insufficiency   .  Hyperlipidemia   .  G2 P2  Past Surgical History:  Procedure Laterality Date  . CESAREAN SECTION     x2   . COLONOSCOPY N/A 09/07/2016   Procedure: COLONOSCOPY;  Surgeon:  Garlan Fair, MD;  Location: [facility];  Service: Endoscopy;  Laterality: N/A;  . TONSILLECTOMY    . TUBAL LIGATION      Medications: Reviewed  Allergies: No Known Allergies  Family history: Her mother died of pancreas cancer dates 6. Her brother has prostate cancer. A first cousin had breast cancer. Her father had "leukemia ". No family history of colon cancer. She has 2 sons and 3 brothers.  Social History:   She lives in [place] with her husband. She is retired from [employer]. She worked in an office occupation. She does not use cigarettes or alcohol. No transfusion history. No risk factor for HIV or hepatitis.   ROS:   Positives include:25 pound weight loss over the past year, occasional blood when wiping following a bowel movement, hallucination after taking iron twice daily, intermittent foot swelling lasting 2-3 days  A complete ROS was otherwise negative.  Physical Exam:  Blood pressure 129/85, pulse 92, temperature 97.9 F (36.6 C), temperature source Oral, resp. rate 19, height 5\' 6"  (1.676 m), weight 104 lb (47.2 kg), SpO2 100 %.  HEENT: Upper denture plate, oropharynx without visible mass, neck without mass  Lungs: Clear bilaterally  Cardiac: Regular rate and rhythm  Abdomen: No hepatosplenomegaly, no mass, nontender  Vascular: No leg edema  Lymph nodes: No cervical, supraclavicular, or inguinal nodes. "Shotty "bilateral axillary nodes  Neurologic: Alert and oriented, the motor exam appears intact in the upper and lower extremities  Skin: No rash  Musculoskeletal: No spine tenderness  LAB:  CBC  Lab Results  Component Value Date   WBC 11.3 (H) 09/14/2016   HGB 7.3 (L) 09/14/2016   HCT 23.5 (L) 09/14/2016   MCV 75.7 (L) 09/14/2016   PLT 597 (H) 09/14/2016   NEUTROABS 8.5 (H) 09/14/2016     CMP      Component Value Date/Time   NA 139 09/14/2016 1047   K 4.6 09/14/2016 1047   CL 105 09/07/2016 1100   CO2 22 09/14/2016  1047   GLUCOSE 87 09/14/2016 1047   BUN 21.4 09/14/2016 1047   CREATININE 1.3 (H) 09/14/2016 1047   CALCIUM 9.5 09/14/2016 1047   PROT 7.3 09/14/2016 1047   ALBUMIN 2.8 (L) 09/14/2016 1047   AST 31 09/14/2016 1047   ALT 24 09/14/2016 1047   ALKPHOS 219 (H) 09/14/2016 1047   BILITOT 0.35 09/14/2016 1047   GFRNONAA 31 (L) 09/07/2016 1100   GFRAA 36 (L) 09/07/2016 1100    CEA-1193.4   Imaging:  CT images from 09/10/2016 reviewed with Lisa Guerra   Assessment/Plan:   1. Colon cancer, adenocarcinoma on biopsy of a cecal mass and 09/07/2016  separate polypoid mass at the sigmoid colon-biopsy 09/10/2016 revealed a tubular adenoma with at least high-grade dysplasia  Staging CTs of the chest, abdomen, and pelvis 09/08/2016-extensive liver and lung metastases. Bulky abdominal/rectal peritoneal lymphadenopathy  Markedly elevated CEA  2.   Iron deficiency anemia secondary to #1  3.   Chronic renal insufficiency  4.   Multiple colon polyps noted on the colonoscopy 09/07/2016 including a polyp with high-grade dysplasia  5.   Hypertension  6.   Family history of multiple cancers including pancreas cancer  7.   Hyperlipidemia  Disposition:   Lisa Guerra has been diagnosed with colon cancer. She appears to have synchronous right and left colon primaries with extensive metastatic disease involving the liver. She also has metastatic disease involving abdominal/right peroneal lymph nodes and lung nodules.  I discussed the diagnosis, prognosis, and treatment options with Lisa Guerra. She understands no therapy will be curative. I recommend systemic therapy to begin within the next few weeks.  We referred her for placement of a Port-A-Cath and a liver biopsy. The liver biopsy material will be submitted for Foundation 1 testing to establish the RAS mutation status and to look for evidence of HNPCC. She understands her family members are at increased risk of developing colorectal cancer  and should receive appropriate screening. We will refer her to the genetic screening clinic.  I recommend FOLFOX/Avastin as initial treatment for the colon cancer. We reviewed the potential toxicities associated with the FOLFOX regimen including the chance for nausea/vomiting, and excised, diarrhea, alopecia, and hematologic toxicity. We discussed the sun sensitivity, hyperpigmentation, and hand/foot syndrome associated with 5 fluorouracil. We reviewed the allergic reaction and various types of neuropathy associated with oxaliplatin. We discussed the hypertension, bleeding, thromboembolic disease, bowel perforation, CNS, and renal toxicity associated with Avastin. She will attend a chemotherapy teaching class.  Lisa Guerra lives closer to the [facility] clinic. I will contact the clinical team at [facility] to arrange for an appointment as soon as possible.  I am available to see her in the future as needed.  She will increase the ferrous sulfate to 3 times daily.  60 minutes were spent with the patient today. The majority of the time was used for counseling and coordination of care.  Betsy Coder, MD  09/14/2016, 5:21 PM

## 2016-09-14 NOTE — Telephone Encounter (Signed)
Appointments scheduled per 2/19 LOS. Patient given AVS report and calendars with future scheduled appointments. °

## 2016-09-15 ENCOUNTER — Other Ambulatory Visit (HOSPITAL_COMMUNITY): Payer: Self-pay | Admitting: Oncology

## 2016-09-16 ENCOUNTER — Telehealth: Payer: Self-pay | Admitting: *Deleted

## 2016-09-16 NOTE — Telephone Encounter (Signed)
Message from pt reporting she has decided to have chemo in [place]. Dr. Benay Spice aware. Message to schedulers for office visit 3/2

## 2016-09-17 ENCOUNTER — Telehealth: Payer: Self-pay | Admitting: Oncology

## 2016-09-17 NOTE — Telephone Encounter (Signed)
sw pt to confirm 3/2 appt at 0830 per LOS

## 2016-09-20 ENCOUNTER — Observation Stay (HOSPITAL_COMMUNITY)
Admission: EM | Admit: 2016-09-20 | Discharge: 2016-09-22 | Disposition: A | Discharge status: Home / Self Care | Payer: Medicare Other | Attending: Internal Medicine | Admitting: Internal Medicine

## 2016-09-20 ENCOUNTER — Emergency Department (HOSPITAL_COMMUNITY): Payer: Medicare Other

## 2016-09-20 ENCOUNTER — Encounter (HOSPITAL_COMMUNITY): Payer: Self-pay | Admitting: *Deleted

## 2016-09-20 DIAGNOSIS — R000 Tachycardia, unspecified: Secondary | ICD-10-CM | POA: Insufficient documentation

## 2016-09-20 DIAGNOSIS — R945 Abnormal results of liver function studies: Secondary | ICD-10-CM | POA: Diagnosis not present

## 2016-09-20 DIAGNOSIS — R748 Abnormal levels of other serum enzymes: Secondary | ICD-10-CM

## 2016-09-20 DIAGNOSIS — R079 Chest pain, unspecified: Secondary | ICD-10-CM | POA: Diagnosis not present

## 2016-09-20 DIAGNOSIS — C189 Malignant neoplasm of colon, unspecified: Secondary | ICD-10-CM | POA: Diagnosis present

## 2016-09-20 DIAGNOSIS — C799 Secondary malignant neoplasm of unspecified site: Secondary | ICD-10-CM | POA: Diagnosis present

## 2016-09-20 DIAGNOSIS — I10 Essential (primary) hypertension: Secondary | ICD-10-CM | POA: Diagnosis not present

## 2016-09-20 DIAGNOSIS — Z7982 Long term (current) use of aspirin: Secondary | ICD-10-CM | POA: Diagnosis not present

## 2016-09-20 DIAGNOSIS — Z85038 Personal history of other malignant neoplasm of large intestine: Secondary | ICD-10-CM | POA: Diagnosis not present

## 2016-09-20 DIAGNOSIS — R0602 Shortness of breath: Secondary | ICD-10-CM | POA: Diagnosis present

## 2016-09-20 DIAGNOSIS — R7989 Other specified abnormal findings of blood chemistry: Secondary | ICD-10-CM | POA: Diagnosis present

## 2016-09-20 DIAGNOSIS — D649 Anemia, unspecified: Secondary | ICD-10-CM | POA: Diagnosis present

## 2016-09-20 HISTORY — DX: Malignant neoplasm of colon, unspecified: C18.9

## 2016-09-20 HISTORY — DX: Anemia, unspecified: D64.9

## 2016-09-20 HISTORY — DX: Pure hypercholesterolemia, unspecified: E78.00

## 2016-09-20 LAB — TYPE AND SCREEN
ABO/RH(D): O POS
Antibody Screen: NEGATIVE

## 2016-09-20 LAB — COMPREHENSIVE METABOLIC PANEL
ALK PHOS: 573 U/L — AB (ref 38–126)
ALT: 257 U/L — ABNORMAL HIGH (ref 14–54)
ANION GAP: 10 (ref 5–15)
AST: 356 U/L — ABNORMAL HIGH (ref 15–41)
Albumin: 2.9 g/dL — ABNORMAL LOW (ref 3.5–5.0)
BUN: 29 mg/dL — ABNORMAL HIGH (ref 6–20)
CALCIUM: 9.2 mg/dL (ref 8.9–10.3)
CO2: 23 mmol/L (ref 22–32)
Chloride: 108 mmol/L (ref 101–111)
Creatinine, Ser: 1.5 mg/dL — ABNORMAL HIGH (ref 0.44–1.00)
GFR calc non Af Amer: 35 mL/min — ABNORMAL LOW (ref >60)
GFR, EST AFRICAN AMERICAN: 40 mL/min — AB (ref >60)
Glucose, Bld: 152 mg/dL — ABNORMAL HIGH (ref 65–99)
Potassium: 3.8 mmol/L (ref 3.5–5.1)
SODIUM: 141 mmol/L (ref 135–145)
TOTAL PROTEIN: 7.6 g/dL (ref 6.5–8.1)
Total Bilirubin: 1.7 mg/dL — ABNORMAL HIGH (ref 0.3–1.2)

## 2016-09-20 LAB — CBC
HCT: 25.6 % — ABNORMAL LOW (ref 36.0–46.0)
HEMOGLOBIN: 7.9 g/dL — AB (ref 12.0–15.0)
MCH: 23.9 pg — ABNORMAL LOW (ref 26.0–34.0)
MCHC: 30.9 g/dL (ref 30.0–36.0)
MCV: 77.3 fL — ABNORMAL LOW (ref 78.0–100.0)
Platelets: 621 10*3/uL — ABNORMAL HIGH (ref 150–400)
RBC: 3.31 MIL/uL — ABNORMAL LOW (ref 3.87–5.11)
RDW: 17.9 % — AB (ref 11.5–15.5)
WBC: 14.4 10*3/uL — ABNORMAL HIGH (ref 4.0–10.5)

## 2016-09-20 LAB — TROPONIN I: Troponin I: <0.03 ng/mL (ref <0.03)

## 2016-09-20 LAB — D-DIMER, QUANTITATIVE (NOT AT ARMC): D DIMER QUANT: 2.9 ug{FEU}/mL — AB (ref 0.00–0.50)

## 2016-09-20 MED ORDER — IOPAMIDOL (ISOVUE-370) INJECTION 76%
100.0000 mL | Freq: Once | INTRAVENOUS | Status: AC | PRN
  Administered 2016-09-20: 75 mL via INTRAVENOUS

## 2016-09-20 MED ORDER — NITROGLYCERIN 0.4 MG SL SUBL
0.4000 mg | SUBLINGUAL_TABLET | SUBLINGUAL | Status: DC | PRN
  Administered 2016-09-20: 0.4 mg via SUBLINGUAL
  Filled 2016-09-20: qty 1

## 2016-09-20 MED ORDER — ASPIRIN 81 MG PO CHEW: 324.0000 mg | CHEWABLE_TABLET | Freq: Once | ORAL | Status: DC

## 2016-09-20 NOTE — ED Provider Notes (Signed)
[facility] DEPT Provider Note   CSN: [id number] Arrival date & time: 09/20/16  1925     History   Chief Complaint Chief Complaint  Patient presents with  . Shortness of Breath    HPI Lisa Guerra is a 71 y.o. female.   Shortness of Breath  This is a new problem. The average episode lasts 1 day. The problem occurs continuously.The problem has not changed since onset.Associated symptoms include chest pain. Pertinent negatives include no fever and no syncope. She has tried nothing for the symptoms. The treatment provided no relief. She has had no prior hospitalizations. She has had no prior ED visits. She has had no prior ICU admissions.    Past Medical History:  Diagnosis Date  . Anemia   . Cancer ([place])   . Colon cancer ([place])   . High cholesterol   . Hypertension   . Low hemoglobin     Patient Active Problem List   Diagnosis Date Noted  . Cancer of ascending colon ([place]) 09/14/2016    Past Surgical History:  Procedure Laterality Date  . CESAREAN SECTION     x2   . COLONOSCOPY N/A 09/07/2016   Procedure: COLONOSCOPY;  Surgeon: Garlan Fair, MD;  Location: [facility];  Service: Endoscopy;  Laterality: N/A;  . TONSILLECTOMY    . TUBAL LIGATION      OB History    No data available       Home Medications    Prior to Admission medications   Medication Sig Start Date End Date Taking? Authorizing Provider  amLODipine (NORVASC) 5 MG tablet Take 5 mg by mouth daily.   Yes Historical Provider, MD  aspirin EC 81 MG tablet Take 162 mg by mouth every 6 (six) hours as needed for mild pain.   Yes Historical Provider, MD  atorvastatin (LIPITOR) 10 MG tablet Take 10 mg by mouth every evening.    Yes Historical Provider, MD  ferrous sulfate 325 (65 FE) MG tablet Take 1 tablet (325 mg total) by mouth 3 (three) times daily with meals. 09/14/16  Yes Ladell Pier, MD  lisinopril-hydrochlorothiazide (PRINZIDE,ZESTORETIC) 20-12.5 MG per tablet Take 1 tablet by mouth  daily.   Yes Historical Provider, MD    Family History No family history on file.  Social History Social History  Substance Use Topics  . Smoking status: Never Smoker  . Smokeless tobacco: Never Used  . Alcohol use No     Allergies   Patient has no known allergies.   Review of Systems Review of Systems  Constitutional: Negative for fever.  Respiratory: Positive for shortness of breath.   Cardiovascular: Positive for chest pain. Negative for syncope.  All other systems reviewed and are negative.    Physical Exam Updated Vital Signs BP 118/88   Pulse 117   Temp 97.8 F (36.6 C) (Oral)   Resp 14   Ht 5' 5.5" (1.664 m)   Wt 104 lb (47.2 kg)   SpO2 99%   BMI 17.04 kg/m   Physical Exam  Constitutional: She appears well-developed and well-nourished.  HENT:  Head: Normocephalic and atraumatic.  Eyes: Conjunctivae and EOM are normal.  Neck: Normal range of motion.  Cardiovascular: Regular rhythm.  Tachycardia present.   Pulmonary/Chest: No stridor. No respiratory distress.  Abdominal: Soft. She exhibits no distension.  Neurological: She is alert.  Skin: Skin is warm and dry.  Nursing note and vitals reviewed.    ED Treatments / Results  Labs (all labs ordered  are listed, but only abnormal results are displayed) Labs Reviewed  CBC - Abnormal; Notable for the following:       Result Value   WBC 14.4 (*)    RBC 3.31 (*)    Hemoglobin 7.9 (*)    HCT 25.6 (*)    MCV 77.3 (*)    MCH 23.9 (*)    RDW 17.9 (*)    Platelets 621 (*)    All other components within normal limits  COMPREHENSIVE METABOLIC PANEL - Abnormal; Notable for the following:    Glucose, Bld 152 (*)    BUN 29 (*)    Creatinine, Ser 1.50 (*)    Albumin 2.9 (*)    AST 356 (*)    ALT 257 (*)    Alkaline Phosphatase 573 (*)    Total Bilirubin 1.7 (*)    GFR calc non Af Amer 35 (*)    GFR calc Af Amer 40 (*)    All other components within normal limits  TROPONIN I  OCCULT BLOOD X 1 CARD  TO LAB, STOOL  D-DIMER, QUANTITATIVE (NOT AT [facility])  TYPE AND SCREEN    EKG  EKG Interpretation  Date/Time:  Sunday September 20 2016 19:40:17 EST Ventricular Rate:  104 PR Interval:    QRS Duration: 67 QT Interval:  325 QTC Calculation: 428 R Axis:   52 Text Interpretation:  Sinus tachycardia Low voltage, precordial leads No significant change since last tracing in june 2014 Confirmed by [facility] MD, Corene Cornea [phone]) on 09/20/2016 8:05:01 PM       Radiology Dg Chest 2 View  Result Date: 09/20/2016 CLINICAL DATA:  Shortness of breath, chest pressure, nonproductive cough. EXAM: CHEST  2 VIEW COMPARISON:  CT 10 days prior 09/10/2016 FINDINGS: The cardiomediastinal contours are normal. Calcified granuloma at the right lung base. The pulmonary nodules on recent chest CT are not well delineated radiographically, 1 of the left lung nodules potentially identified. Pulmonary vasculature is normal. No consolidation, pleural effusion, or pneumothorax. No acute osseous abnormalities are seen. IMPRESSION: No acute abnormality visualized radiographically. Majority of the pulmonary nodules on recent chest CT are not visualized by radiograph. Electronically Signed   By: Jeb Levering M.D.   On: 09/20/2016 20:33    Procedures Procedures (including critical care time)  Medications Ordered in ED Medications  aspirin chewable tablet 324 mg (324 mg Oral Not Given 09/20/16 2001)  nitroGLYCERIN (NITROSTAT) SL tablet 0.4 mg (0.4 mg Sublingual Given 09/20/16 1956)     Initial Impression / Assessment and Plan / ED Course  I have reviewed the triage vital signs and the nursing notes.  Pertinent labs & imaging results that were available during my care of the patient were reviewed by me and considered in my medical decision making (see chart for details).    Anemia v acs v pneumonia v PE.   Anemia at baseline. No PNA/PE on ct scan. Chest pain and dyspnea both improved with ntg. Slight low bp with same  improved on it's own.  Liver enzymes up 2/2 metastatic disease Will admit for acs rule out.   Final Clinical Impressions(s) / ED Diagnoses   Final diagnoses:  Essential hypertension  Elevated liver enzymes     Merrily Pew, MD 09/21/16 1317

## 2016-09-20 NOTE — ED Notes (Signed)
Returned from xray

## 2016-09-20 NOTE — ED Notes (Signed)
EKG given to Dr Dayna Barker at bedside,

## 2016-09-20 NOTE — ED Triage Notes (Signed)
Pt c/o sob, chest pressure that started yesterday, pt states that the pressure is worse when she tries to take a deep breath, had n/v on the way to the er, pt did take two aspirin prior to arrival in er, but is unsure of what dosage they were,

## 2016-09-21 ENCOUNTER — Observation Stay (HOSPITAL_COMMUNITY): Payer: Medicare Other

## 2016-09-21 ENCOUNTER — Observation Stay (HOSPITAL_BASED_OUTPATIENT_CLINIC_OR_DEPARTMENT_OTHER): Payer: Medicare Other

## 2016-09-21 DIAGNOSIS — D649 Anemia, unspecified: Secondary | ICD-10-CM | POA: Diagnosis not present

## 2016-09-21 DIAGNOSIS — R079 Chest pain, unspecified: Secondary | ICD-10-CM

## 2016-09-21 DIAGNOSIS — C799 Secondary malignant neoplasm of unspecified site: Secondary | ICD-10-CM | POA: Diagnosis not present

## 2016-09-21 DIAGNOSIS — R7989 Other specified abnormal findings of blood chemistry: Secondary | ICD-10-CM | POA: Diagnosis present

## 2016-09-21 DIAGNOSIS — R945 Abnormal results of liver function studies: Secondary | ICD-10-CM

## 2016-09-21 DIAGNOSIS — R0789 Other chest pain: Secondary | ICD-10-CM | POA: Diagnosis not present

## 2016-09-21 DIAGNOSIS — I10 Essential (primary) hypertension: Secondary | ICD-10-CM | POA: Diagnosis not present

## 2016-09-21 LAB — TROPONIN I
Troponin I: <0.03 ng/mL (ref <0.03)
Troponin I: <0.03 ng/mL (ref <0.03)

## 2016-09-21 LAB — ECHOCARDIOGRAM COMPLETE
HEIGHTINCHES: 65.5 in
WEIGHTICAEL: 1598.4 [oz_av]

## 2016-09-21 LAB — RETICULOCYTES
RBC.: 3.18 MIL/uL — ABNORMAL LOW (ref 3.87–5.11)
Retic Count, Absolute: 98.6 10*3/uL (ref 19.0–186.0)
Retic Ct Pct: 3.1 % (ref 0.4–3.1)

## 2016-09-21 LAB — LIPASE, BLOOD: Lipase: 23 U/L (ref 11–51)

## 2016-09-21 LAB — FERRITIN: FERRITIN: 49 ng/mL (ref 11–307)

## 2016-09-21 LAB — IRON AND TIBC
IRON: 16 ug/dL — AB (ref 28–170)
SATURATION RATIOS: 5 % — AB (ref 10.4–31.8)
TIBC: 291 ug/dL (ref 250–450)
UIBC: 275 ug/dL

## 2016-09-21 LAB — VITAMIN B12: Vitamin B-12: 802 pg/mL (ref 180–914)

## 2016-09-21 LAB — FOLATE: Folate: 11.1 ng/mL (ref >5.9)

## 2016-09-21 MED ORDER — ONDANSETRON HCL 4 MG/2ML IJ SOLN: 4.0000 mg | Freq: Four times a day (QID) | INTRAMUSCULAR | Status: DC | PRN

## 2016-09-21 MED ORDER — ENOXAPARIN SODIUM 40 MG/0.4ML ~~LOC~~ SOLN
40.0000 mg | SUBCUTANEOUS | Status: DC
  Administered 2016-09-21: 40 mg via SUBCUTANEOUS
  Filled 2016-09-21: qty 0.4

## 2016-09-21 MED ORDER — FERROUS SULFATE 325 (65 FE) MG PO TABS
325.0000 mg | ORAL_TABLET | Freq: Three times a day (TID) | ORAL | Status: DC
  Administered 2016-09-21 – 2016-09-22 (×5): 325 mg via ORAL
  Filled 2016-09-21 (×5): qty 1

## 2016-09-21 MED ORDER — ASPIRIN EC 81 MG PO TBEC: 162.0000 mg | DELAYED_RELEASE_TABLET | Freq: Four times a day (QID) | ORAL | Status: DC | PRN

## 2016-09-21 MED ORDER — HYDROCHLOROTHIAZIDE 12.5 MG PO CAPS
12.5000 mg | ORAL_CAPSULE | Freq: Every day | ORAL | Status: DC
  Administered 2016-09-21: 12.5 mg via ORAL
  Filled 2016-09-21 (×2): qty 1

## 2016-09-21 MED ORDER — ATORVASTATIN CALCIUM 10 MG PO TABS: 10.0000 mg | ORAL_TABLET | Freq: Every evening | ORAL | Status: DC

## 2016-09-21 MED ORDER — AMLODIPINE BESYLATE 5 MG PO TABS
5.0000 mg | ORAL_TABLET | Freq: Every day | ORAL | Status: DC
  Administered 2016-09-21 – 2016-09-22 (×2): 5 mg via ORAL
  Filled 2016-09-21 (×3): qty 1

## 2016-09-21 MED ORDER — SODIUM CHLORIDE 0.9 % IV SOLN
INTRAVENOUS | Status: DC
  Administered 2016-09-21 – 2016-09-22 (×3): via INTRAVENOUS

## 2016-09-21 MED ORDER — ACETAMINOPHEN 325 MG PO TABS
650.0000 mg | ORAL_TABLET | ORAL | Status: DC | PRN
  Administered 2016-09-22: 650 mg via ORAL
  Filled 2016-09-21: qty 2

## 2016-09-21 MED ORDER — LISINOPRIL 10 MG PO TABS
20.0000 mg | ORAL_TABLET | Freq: Every day | ORAL | Status: DC
  Administered 2016-09-21: 20 mg via ORAL
  Filled 2016-09-21 (×2): qty 2

## 2016-09-21 MED ORDER — ADULT MULTIVITAMIN W/MINERALS CH
1.0000 | ORAL_TABLET | Freq: Every day | ORAL | Status: DC
  Administered 2016-09-21 – 2016-09-22 (×2): 1 via ORAL
  Filled 2016-09-21 (×2): qty 1

## 2016-09-21 MED ORDER — GI COCKTAIL ~~LOC~~: 30.0000 mL | Freq: Four times a day (QID) | ORAL | Status: DC | PRN

## 2016-09-21 NOTE — Care Management Note (Signed)
Case Management Note  Patient Details  Name: SAMIR LEYMAN MRN: [id number] Date of Birth: 19-Feb-1946  Subjective/Objective:                  Pt is from home, admitted r/o CP. Pt is ind with ADL's. She has PCP, transportation to appointments and no difficulty affording or managing medications. She is established at the [facility] cancer center for treatment of her colon cancer.   Action/Plan: Pt plans to return home with self care.   Expected Discharge Date:  09/21/16               Expected Discharge Plan:  Home/Self Care  In-House Referral:  NA  Discharge planning Services  CM Consult  Post Acute Care Choice:  NA Choice offered to:  NA Status of Service:  Completed, signed off  Sherald Barge, RN 09/21/2016, 3:19 PM

## 2016-09-21 NOTE — H&P (Signed)
History and Physical    Lisa Guerra [id number] DOB: 09-15-1945 DOA: 09/20/2016  PCP: [name], MD  Patient coming from: home  Chief Complaint:  Chest pain  HPI: Lisa Guerra is a 71 y.o. female with medical history significant of htn, hld comes in with 2 days of sscp that has lasted hours at a time with one episode of vomiting associated with coughing and sob.  She denies any fevers.  No hemoptysis.  No le edema or swelling.  No radiation of the pain and not pleuritic.  Pt pain relieved in ED with ntg, referred for admission for possible ACS.   Review of Systems: As per HPI otherwise 10 point review of systems negative.   Past Medical History:  Diagnosis Date  . Anemia   . Cancer ([place])   . Colon cancer ([place])   . High cholesterol   . Hypertension   . Low hemoglobin     Past Surgical History:  Procedure Laterality Date  . CESAREAN SECTION     x2   . COLONOSCOPY N/A 09/07/2016   Procedure: COLONOSCOPY;  Surgeon: Garlan Fair, MD;  Location: [facility];  Service: Endoscopy;  Laterality: N/A;  . TONSILLECTOMY    . TUBAL LIGATION       reports that she has never smoked. She has never used smokeless tobacco. She reports that she does not drink alcohol or use drugs.  No Known Allergies  No family history on file.  Prior to Admission medications   Medication Sig Start Date End Date Taking? Authorizing Provider  amLODipine (NORVASC) 5 MG tablet Take 5 mg by mouth daily.   Yes Historical Provider, MD  aspirin EC 81 MG tablet Take 162 mg by mouth every 6 (six) hours as needed for mild pain.   Yes Historical Provider, MD  atorvastatin (LIPITOR) 10 MG tablet Take 10 mg by mouth every evening.    Yes Historical Provider, MD  ferrous sulfate 325 (65 FE) MG tablet Take 1 tablet (325 mg total) by mouth 3 (three) times daily with meals. 09/14/16  Yes Ladell Pier, MD  lisinopril-hydrochlorothiazide (PRINZIDE,ZESTORETIC) 20-12.5 MG per tablet Take 1 tablet by  mouth daily.   Yes Historical Provider, MD    Physical Exam: Vitals:   09/20/16 2200 09/20/16 2215 09/20/16 2300 09/20/16 2330  BP: 118/79  117/79 105/75  Pulse: 79 71 72 71  Resp: 23 21 12 17   Temp:      TempSrc:      SpO2: 100% 99% 99% 100%  Weight:      Height:        Constitutional: NAD, calm, comfortable Vitals:   09/20/16 2200 09/20/16 2215 09/20/16 2300 09/20/16 2330  BP: 118/79  117/79 105/75  Pulse: 79 71 72 71  Resp: 23 21 12 17   Temp:      TempSrc:      SpO2: 100% 99% 99% 100%  Weight:      Height:       Eyes: PERRL, lids and conjunctivae normal ENMT: Mucous membranes are moist. Posterior pharynx clear of any exudate or lesions.Normal dentition.  Neck: normal, supple, no masses, no thyromegaly Respiratory: clear to auscultation bilaterally, no wheezing, no crackles. Normal respiratory effort. No accessory muscle use.  Cardiovascular: Regular rate and rhythm, no murmurs / rubs / gallops. No extremity edema. 2+ pedal pulses. No carotid bruits.  Abdomen: no tenderness, no masses palpated. No hepatosplenomegaly. Bowel sounds positive.  Musculoskeletal: no clubbing / cyanosis. No joint deformity upper  and lower extremities. Good ROM, no contractures. Normal muscle tone.  Skin: no rashes, lesions, ulcers. No induration Neurologic: CN 2-12 grossly intact. Sensation intact, DTR normal. Strength 5/5 in all 4.  Psychiatric: Normal judgment and insight. Alert and oriented x 3. Normal mood.    Labs on Admission: I have personally reviewed following labs and imaging studies  CBC:  Recent Labs Lab 09/14/16 1047 09/20/16 1945  WBC 11.3* 14.4*  NEUTROABS 8.5*  --   HGB 7.3* 7.9*  HCT 23.5* 25.6*  MCV 75.7* 77.3*  PLT 597* [id number]*   Basic Metabolic Panel:  Recent Labs Lab 09/14/16 1047 09/20/16 1945  NA 139 141  K 4.6 3.8  CL  --  108  CO2 22 23  GLUCOSE 87 152*  BUN 21.4 29*  CREATININE 1.3* 1.50*  CALCIUM 9.5 9.2   GFR: Estimated Creatinine Clearance:  26.7 mL/min (by C-G formula based on SCr of 1.5 mg/dL (H)). Liver Function Tests:  Recent Labs Lab 09/14/16 1047 09/20/16 1945  AST 31 356*  ALT 24 257*  ALKPHOS 219* 573*  BILITOT 0.35 1.7*  PROT 7.3 7.6  ALBUMIN 2.8* 2.9*   Coagulation Profile:  Recent Labs Lab 09/14/16 1047  INR 1.20*  PROTIME 14.4*   Cardiac Enzymes:  Recent Labs Lab 09/20/16 1945  TROPONINI <0.03   Radiological Exams on Admission: Dg Chest 2 View  Result Date: 09/20/2016 CLINICAL DATA:  Shortness of breath, chest pressure, nonproductive cough. EXAM: CHEST  2 VIEW COMPARISON:  CT 10 days prior 09/10/2016 FINDINGS: The cardiomediastinal contours are normal. Calcified granuloma at the right lung base. The pulmonary nodules on recent chest CT are not well delineated radiographically, 1 of the left lung nodules potentially identified. Pulmonary vasculature is normal. No consolidation, pleural effusion, or pneumothorax. No acute osseous abnormalities are seen. IMPRESSION: No acute abnormality visualized radiographically. Majority of the pulmonary nodules on recent chest CT are not visualized by radiograph. Electronically Signed   By: Jeb Levering M.D.   On: 09/20/2016 20:33   Ct Angio Chest Pe W And/or Wo Contrast  Result Date: 09/20/2016 CLINICAL DATA:  Chest pain/pressure, onset today. Recent diagnosis of colon cancer. EXAM: CT ANGIOGRAPHY CHEST WITH CONTRAST TECHNIQUE: Multidetector CT imaging of the chest was performed using the standard protocol during bolus administration of intravenous contrast. Multiplanar CT image reconstructions and MIPs were obtained to evaluate the vascular anatomy. CONTRAST:  Seventy-five cc Isovue 100 IV COMPARISON:  Radiographs earlier this day. Chest CT 10 days prior 09/10/2016 FINDINGS: Cardiovascular: There are no filling defects within the pulmonary arteries to suggest pulmonary embolus. No aortic dissection or aneurysm. Mild aortic atherosclerosis. Heart is normal in size.  Scattered coronary artery calcifications. Mediastinum/Nodes: No pericardial effusion. No mediastinal or hilar adenopathy. Visualized thyroid gland is unremarkable. The esophagus is decompressed. Lungs/Pleura: No evidence of pulmonary edema or focal airspace disease. No pleural fluid. Multiple subcentimeter pulmonary nodules are scattered throughout both lungs, unchanged from recent prior exam. Upper Abdomen: Hepatic metastatic disease again seen. Porta hepatis adenopathy again seen. Upper abdomen better assessed on prior exam due to phase of contrast. Musculoskeletal: No blastic or evidence of lytic osseous lesions. No acute osseous abnormality. Review of the MIP images confirms the above findings. IMPRESSION: 1. No pulmonary embolus. 2. No acute abnormality. Multiple subcentimeter pulmonary nodules consistent with metastatic disease are unchanged from recent prior exam. 3. Mild aortic atherosclerosis.  Coronary artery calcifications. Electronically Signed   By: Jeb Levering M.D.   On: 09/20/2016 22:14  EKG: Independently reviewed. Sinus tach 104 no acute issues  Assessment/Plan 71 yo female with atypical chest pain with multiple risk factors  Principal Problem:   Chest pain- romi, echo in am.  If all neg, arrange outpatient stress testing Aspirin daily  Active Problems:   Metastatic cancer ([place])- noted   Cancer of ascending colon ([place])- noted   Anemia- stable   Hypertension- stable    DVT prophylaxis:  scds  Code Status:  full Family Communication: none  Disposition Plan:  Per day team Consults called:  none Admission status:  observation   DAVID,RACHAL A MD [facility]  If 7PM-7AM, please contact night-coverage [email or web address] Password [email or web address]  09/21/2016, 12:01 AM

## 2016-09-21 NOTE — Progress Notes (Signed)
Initial Nutrition Assessment  DOCUMENTATION CODES:   Severe malnutrition in context of acute illness/injury  INTERVENTION:  High -protein snacks as desired daily    NUTRITION DIAGNOSIS:   Malnutrition related to catabolic illness as evidenced by moderate depletion of body fat, moderate depletions of muscle mass.   GOAL:   Patient will meet greater than or equal to 90% of their needs if the patients goal for healthcare is more aggressive.    MONITOR: decisions regarding plan of care and treatment direction      REASON FOR ASSESSMENT:   Malnutrition Screening Tool    ASSESSMENT: Patient had colonoscopy on 09/07/16. The MD obtained biopsies of 2 large masses.  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 On 2/15 pt had CT of chest abdomen and pelvis which findings were indicative of metastases .   Ms Lisa Guerra says her appetite is good today. She ate well at breakfast and lunch 50-75%. She has not been eating consistently at home and has noticed a decrease in her energy level.  She has anemia and had thought her increased lethargy was related to that problem. She doesn't drink oral nutrition supplements or take vitamins.  Her weight has dropped severely 3.9% (4#) in 1 week and 17% ( 20#) in 2 weeks.   Nutrition-Focused physical exam completed findings are moderate fat depletion (orbital, upper arms) moderate muscle depletion (temporal, clavicle), and no edema.     Recent Labs Lab 09/20/16 1945  NA 141  K 3.8  CL 108  CO2 23  BUN 29*  CREATININE 1.50*  CALCIUM 9.2  GLUCOSE 152*   Labs and meds reviewed.  Diet Order:  Diet Heart Room service appropriate? Yes; Fluid consistency: Thin  Skin:  Reviewed, no issues  Last BM:  unknown  Height:   Ht Readings from Last 1 Encounters:  09/20/16 5' 5.5" (1.664 m)    Weight:   Wt Readings from Last 1 Encounters:  09/20/16 99 lb 14.4 oz (45.3 kg)    Ideal Body Weight:  59 kg  BMI:  Body mass index  is 16.37 kg/m.  Estimated Nutritional Needs:   Kcal:  1575-1710  Protein:  65-70 gr   Fluid:  > 1350 ml daily  EDUCATION NEEDS:   Education needs addressed  Colman Cater MS,RD,CSG,LDN Office: [phone] Pager: [phone]

## 2016-09-21 NOTE — Progress Notes (Signed)
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 She one episode of vomiting prior to arrival. She was admitted for further evaluation. She ruled out for ACS with negative cardiac markers. It was noted that she had elevated liver enzymes which were normal one week prior. Abdominal ultrasound was unrevealing. These will be rechecked in the morning and if stable, she can possibly discharge in the morning.   Assessment & Plan:   Principal Problem:   Chest pain Active Problems:   Cancer of ascending colon (HCC)   Anemia   Essential hypertension   Metastatic cancer ([place])   Elevated LFTs   1. Chest pain. Patient has ruled out for ACS with negative cardiac markers. Echocardiogram was also unremarkable. She can follow-up with cardiology for further management. She is no longer having any chest discomfort at present.  2. Elevated liver function tests. Possibly related to metastatic disease to liver. Abdominal ultrasound does not show any gallstones or dilated bile duct. Recent CT from 2/15 indicated possible developing portal vein thrombosis. Could also be related to some degree of dehydration. It is concerning that within the past week, liver enzymes were normal. Recheck labs in a.m. once hydrated.  3. Metastatic colon cancer with metastases to liver and lungs. Patient is followed by oncology in [place]. She plans to start systemic chemotherapy. She has been told that her cancer is not curable.  4. Anemia. History of iron deficiency on iron supplementation. Check anemia panel.   DVT prophylaxis: lovenox Code Status: full code Family Communication: discussed with husband at bedside Disposition Plan: discharge home once improved   Consultants:      Procedures:  Echo: - Upper normal LV wall thickness with LVEF 60-65% and grade 1   diastolic dysfunction. Trivial mitral regurgitation. Mildly   sclerotic aortic valve with trivial aortic regurgitation. Normal    estimated PASP 24 mmHg.  Antimicrobials:       Subjective: Chest discomfort has resolved. No shortness of breath. Feels good.  Objective: Vitals:   09/21/16 0759 09/21/16 1159 09/21/16 1409 09/21/16 1559  BP: 112/75 119/77 118/67 118/75  Pulse: 66 87 82 76  Resp: 18 18 20 18   Temp: 97.7 F (36.5 C)  98.8 F (37.1 C)   TempSrc: Oral  Oral   SpO2: 100% 99% 100% 100%  Weight:      Height:        Intake/Output Summary (Last 24 hours) at 09/21/16 1855 Last data filed at 09/21/16 1800  Gross per 24 hour  Intake              720 ml  Output              500 ml  Net              220 ml   Filed Weights   09/20/16 1944 09/20/16 2359  Weight: 47.2 kg (104 lb) 45.3 kg (99 lb 14.4 oz)    Examination:  General exam: Appears calm and comfortable  Respiratory system: Clear to auscultation. Respiratory effort normal. Cardiovascular system: S1 & S2 heard, RRR. No JVD, murmurs, rubs, gallops or clicks. No pedal edema. Gastrointestinal system: Abdomen is nondistended, soft and nontender. No organomegaly or masses felt. Normal bowel sounds heard. Central nervous system: Alert and oriented. No focal neurological  deficits. Extremities: Symmetric 5 x 5 power. Skin: No rashes, lesions or ulcers Psychiatry: Judgement and insight appear normal. Mood & affect appropriate.     Data Reviewed: I have personally reviewed following labs and imaging studies  CBC:  Recent Labs Lab 09/20/16 1945  WBC 14.4*  HGB 7.9*  HCT 25.6*  MCV 77.3*  PLT [id number]*   Basic Metabolic Panel:  Recent Labs Lab 09/20/16 1945  NA 141  K 3.8  CL 108  CO2 23  GLUCOSE 152*  BUN 29*  CREATININE 1.50*  CALCIUM 9.2   GFR: Estimated Creatinine Clearance: 25.7 mL/min (by C-G formula  based on SCr of 1.5 mg/dL (H)). Liver Function Tests:  Recent Labs Lab 09/20/16 1945  AST 356*  ALT 257*  ALKPHOS 573*  BILITOT 1.7*  PROT 7.6  ALBUMIN 2.9*    Recent Labs Lab 09/21/16 1225  LIPASE 23   No results for input(s): AMMONIA in the last 168 hours. Coagulation Profile: No results for input(s): INR, PROTIME in the last 168 hours. Cardiac Enzymes:  Recent Labs Lab 09/20/16 1945 09/21/16 1225  TROPONINI <0.03 <0.03   BNP (last 3 results) No results for input(s): PROBNP in the last 8760 hours. HbA1C: No results for input(s): HGBA1C in the last 72 hours. CBG: No results for input(s): GLUCAP in the last 168 hours. Lipid Profile: No results for input(s): CHOL, HDL, LDLCALC, TRIG, CHOLHDL, LDLDIRECT in the last 72 hours. Thyroid Function Tests: No results for input(s): TSH, T4TOTAL, FREET4, T3FREE, THYROIDAB in the last 72 hours. Anemia Panel:  Recent Labs  09/21/16 1225  RETICCTPCT 3.1   Sepsis Labs: No results for input(s): PROCALCITON, LATICACIDVEN in the last 168 hours.  No results found for this or any previous visit (from the past 240 hour(s)).       Radiology Studies: Dg Chest 2 View  Result Date: 09/20/2016 CLINICAL DATA:  Shortness of breath, chest pressure, nonproductive cough. EXAM: CHEST  2 VIEW COMPARISON:  CT 10 days prior 09/10/2016 FINDINGS: The cardiomediastinal contours are normal. Calcified granuloma at the right lung base. The pulmonary nodules on recent chest CT are not well delineated radiographically, 1 of the left lung nodules potentially identified. Pulmonary vasculature is normal. No consolidation, pleural effusion, or pneumothorax. No acute osseous abnormalities are seen. IMPRESSION: No acute abnormality visualized radiographically. Majority of the pulmonary nodules on recent chest CT are not visualized by radiograph. Electronically Signed   By: Jeb Levering M.D.   On: 09/20/2016 20:33   Ct Angio Chest Pe W And/or Wo  Contrast  Result Date: 09/20/2016 CLINICAL DATA:  Chest pain/pressure, onset today. Recent diagnosis of colon cancer. EXAM: CT ANGIOGRAPHY CHEST WITH CONTRAST TECHNIQUE: Multidetector CT imaging of the chest was performed using the standard protocol during bolus administration of intravenous contrast. Multiplanar CT image reconstructions and MIPs were obtained to evaluate the vascular anatomy. CONTRAST:  Seventy-five cc Isovue 100 IV COMPARISON:  Radiographs earlier this day. Chest CT 10 days prior 09/10/2016 FINDINGS: Cardiovascular: There are no filling defects within the pulmonary arteries to suggest pulmonary embolus. No aortic dissection or aneurysm. Mild aortic atherosclerosis. Heart is normal in size. Scattered coronary artery calcifications. Mediastinum/Nodes: No pericardial effusion. No mediastinal or hilar adenopathy. Visualized thyroid gland is unremarkable. The esophagus is decompressed. Lungs/Pleura: No evidence of pulmonary edema or focal airspace disease. No pleural fluid. Multiple subcentimeter pulmonary nodules are scattered throughout both lungs, unchanged from recent prior exam. Upper Abdomen: Hepatic metastatic disease again seen. Porta hepatis adenopathy again seen. Upper  abdomen better assessed on prior exam due to phase of contrast. Musculoskeletal: No blastic or evidence of lytic osseous lesions. No acute osseous abnormality. Review of the MIP images confirms the above findings. IMPRESSION: 1. No pulmonary embolus. 2. No acute abnormality. Multiple subcentimeter pulmonary nodules consistent with metastatic disease are unchanged from recent prior exam. 3. Mild aortic atherosclerosis.  Coronary artery calcifications. Electronically Signed   By: Jeb Levering M.D.   On: 09/20/2016 22:14   US Abdomen Limited Ruq  Result Date: 09/21/2016 CLINICAL DATA:  Elevated liver enzymes.  History of colon cancer . EXAM: US ABDOMEN LIMITED - RIGHT UPPER QUADRANT COMPARISON:  CT 09/10/2016 . FINDINGS:  Gallbladder: Gallbladder is contracted. Gallbladder wall is thickened at 7.3 mm. This may be from contracted state however cholecystitis cannot be excluded. Hyperproteinemia can also present this fashion . No gallstones noted . Common bile duct: Diameter: 4.0 mm Liver: Liver has a heterogeneous echotexture with multiple solid liver lesions again noted. The largest measures 5.0 x 4.3 x 6.5 cm and is in the right hepatic lobe. A 3.8 cm mass is noted within or adjacent to the left hepatic lobe as well. These findings are consistent with metastatic disease. IMPRESSION: 1. No gallstones. Gallbladder wall is thickened. This may be from partially contracted state. This could also be from hypoproteinemia cholecystitis. No biliary distention noted. 2. Multiple hepatic lesions consistent with metastatic disease . A a mass lesion in the periphery of the left hepatic lobe or adjacent to the left hepatic lobe is noted. This also most likely a exophytic metastatic lesion in the liver versus adjacent adenopathy. Electronically Signed   By: Marcello Moores  Register   On: 09/21/2016 14:06        Scheduled Meds: . amLODipine  5 mg Oral Daily  . aspirin  324 mg Oral Once  . enoxaparin (LOVENOX) injection  40 mg Subcutaneous Q24H  . ferrous sulfate  325 mg Oral TID WC  . multivitamin with minerals  1 tablet Oral Daily   Continuous Infusions: . sodium chloride       LOS: 0 days    Time spent: 25 minutes    MEMON,JEHANZEB, MD [facility] Pager [phone]  If 7PM-7AM, please contact night-coverage [email or web address] Password [email or web address] 09/21/2016, 6:55 PM

## 2016-09-21 NOTE — Progress Notes (Signed)
*  PRELIMINARY RESULTS* Echocardiogram 2D Echocardiogram has been performed.  Leavy Cella 09/21/2016, 9:37 AM

## 2016-09-21 NOTE — Care Management Obs Status (Signed)
MEDICARE OBSERVATION STATUS NOTIFICATION   Patient Details  Name: Lisa Guerra MRN: [id number] Date of Birth: 10-Jul-1946   Medicare Observation Status Notification Given:  Yes    Sherald Barge, RN 09/21/2016, 3:19 PM

## 2016-09-22 ENCOUNTER — Encounter: Payer: Self-pay | Admitting: Gastroenterology

## 2016-09-22 ENCOUNTER — Encounter (HOSPITAL_COMMUNITY): Payer: Self-pay | Admitting: Gastroenterology

## 2016-09-22 ENCOUNTER — Other Ambulatory Visit: Payer: Self-pay | Admitting: Radiology

## 2016-09-22 DIAGNOSIS — R7989 Other specified abnormal findings of blood chemistry: Secondary | ICD-10-CM

## 2016-09-22 DIAGNOSIS — D649 Anemia, unspecified: Secondary | ICD-10-CM | POA: Diagnosis not present

## 2016-09-22 DIAGNOSIS — R0789 Other chest pain: Secondary | ICD-10-CM

## 2016-09-22 DIAGNOSIS — I10 Essential (primary) hypertension: Secondary | ICD-10-CM | POA: Diagnosis not present

## 2016-09-22 LAB — COMPREHENSIVE METABOLIC PANEL
ALK PHOS: 582 U/L — AB (ref 38–126)
ALT: 242 U/L — AB (ref 14–54)
AST: 249 U/L — AB (ref 15–41)
Albumin: 2.6 g/dL — ABNORMAL LOW (ref 3.5–5.0)
Anion gap: 7 (ref 5–15)
BUN: 26 mg/dL — AB (ref 6–20)
CALCIUM: 8.6 mg/dL — AB (ref 8.9–10.3)
CHLORIDE: 105 mmol/L (ref 101–111)
CO2: 23 mmol/L (ref 22–32)
CREATININE: 1.28 mg/dL — AB (ref 0.44–1.00)
GFR, EST AFRICAN AMERICAN: 49 mL/min — AB (ref >60)
GFR, EST NON AFRICAN AMERICAN: 42 mL/min — AB (ref >60)
Glucose, Bld: 105 mg/dL — ABNORMAL HIGH (ref 65–99)
Potassium: 4 mmol/L (ref 3.5–5.1)
Sodium: 135 mmol/L (ref 135–145)
Total Bilirubin: 3.1 mg/dL — ABNORMAL HIGH (ref 0.3–1.2)
Total Protein: 7.1 g/dL (ref 6.5–8.1)

## 2016-09-22 LAB — CBC
HCT: 25.3 % — ABNORMAL LOW (ref 36.0–46.0)
Hemoglobin: 7.7 g/dL — ABNORMAL LOW (ref 12.0–15.0)
MCH: 23.7 pg — AB (ref 26.0–34.0)
MCHC: 30.4 g/dL (ref 30.0–36.0)
MCV: 77.8 fL — AB (ref 78.0–100.0)
PLATELETS: 646 10*3/uL — AB (ref 150–400)
RBC: 3.25 MIL/uL — AB (ref 3.87–5.11)
RDW: 18.4 % — AB (ref 11.5–15.5)
WBC: 15.8 10*3/uL — AB (ref 4.0–10.5)

## 2016-09-22 LAB — TROPONIN I: Troponin I: <0.03 ng/mL (ref <0.03)

## 2016-09-22 MED ORDER — ENOXAPARIN SODIUM 30 MG/0.3ML ~~LOC~~ SOLN: 30.0000 mg | SUBCUTANEOUS | Status: DC

## 2016-09-22 NOTE — Discharge Summary (Signed)
Physician Discharge Summary  Lisa Guerra [id number] DOB: Jul 05, 1946 DOA: 09/20/2016  PCP: [name], MD  Admit date: 09/20/2016 Discharge date: 09/22/2016  Admitted From: home Disposition:  home  Recommendations for Outpatient Follow-up:  1. Follow up with PCP in 1-2 weeks 2. Please obtain BMP/CBC in one week 3. Liver function tests will be repeated on follow-up with interventional radiology on 3/1 when she will undergo liver biopsy. 4. Follow-up with oncology, Dr. Benay Spice, as previously scheduled for chemotherapy.  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 Patient was admitted for further evaluation. She did not have any acute EKG changes. She ruled out for ACS with negative cardiac markers. Echocardiogram was found to be unremarkable. She did not have any further symptoms since admission. It was noted that she did have elevated liver function tests. These were normal approximately one week prior to admission. Abdominal ultrasound which done which did not show any evidence of gallstones or dilated bile duct. She was not having abdominal pain or vomiting. She was seen by gastroenterology who did not feel that she had portal vein thrombosis. It was recommended the patient follow-up with interventional radiology on 3/1 as previously scheduled for liver biopsy at which time liver function tests will be repeated. Her statin has been held for now while transaminases are elevated. Of note, transaminases have trended down since admission, although bilirubin remains elevated which may be due to lag. She does have chronic iron deficiency anemia and is on iron supplementation. She should have repeat CBC and chemistry in 1 week to ensure stability. She'll follow up with  oncology in [facility] for further management of her malignancy. She otherwise feels well and feels ready for discharge.  Discharge Diagnoses:  Principal Problem:   Chest pain Active Problems:   Cancer of ascending colon (HCC)   Anemia   Essential hypertension   Metastatic cancer ([place])   Elevated LFTs    Discharge Instructions  Discharge Instructions    Diet - low sodium heart healthy    Complete by:  As directed    Increase activity slowly    Complete by:  As directed      Allergies as of 09/22/2016   No Known Allergies     Medication List    STOP taking these medications   atorvastatin 10 MG tablet Commonly known as:  LIPITOR   lisinopril-hydrochlorothiazide 20-12.5 MG tablet Commonly known as:  PRINZIDE,ZESTORETIC     TAKE these medications   amLODipine 5 MG tablet Commonly known as:  NORVASC Take 5 mg by mouth daily.   aspirin EC 81 MG tablet Take 162 mg by mouth every 6 (six) hours as needed for mild pain.   ferrous sulfate 325 (65 FE) MG tablet Take 1 tablet (325 mg total) by mouth 3 (three) times daily with meals.       No Known Allergies  Consultations:  Gastroenterology   Procedures/Studies: Dg Chest 2 View  Result Date: 09/20/2016 CLINICAL DATA:  Shortness of breath, chest pressure, nonproductive cough. EXAM: CHEST  2 VIEW COMPARISON:  CT 10 days prior 09/10/2016 FINDINGS: The cardiomediastinal contours are normal. Calcified granuloma at the right lung base. The pulmonary nodules on recent chest CT are not well delineated radiographically, 1 of the left lung nodules potentially identified. Pulmonary vasculature is normal. No consolidation, pleural effusion, or pneumothorax. No acute  osseous abnormalities are seen. IMPRESSION: No acute abnormality visualized radiographically. Majority of the pulmonary nodules on recent chest CT are not visualized by radiograph. Electronically Signed   By: Jeb Levering M.D.   On: 09/20/2016 20:33   Ct Chest W  Contrast  Result Date: 09/10/2016 CLINICAL DATA:  Anemia.  Abnormal colonoscopy. EXAM: CT CHEST, ABDOMEN, AND PELVIS WITH CONTRAST TECHNIQUE: Multidetector CT imaging of the chest, abdomen and pelvis was performed following the standard protocol during bolus administration of intravenous contrast. CONTRAST:  59mL ISOVUE-300 IOPAMIDOL (ISOVUE-300) INJECTION 61% COMPARISON:  None. FINDINGS: CT CHEST FINDINGS Chest wall: No breast masses are identified. No supraclavicular or axillary lymphadenopathy. Small scattered lymph nodes are noted. The thyroid gland is normal. Cardiovascular: The heart is normal in size. No pericardial effusion. The aorta is normal in caliber. No dissection. The branch vessels are patent. Minimal scattered coronary artery calcifications. The pulmonary arteries appear normal. Mediastinum/Nodes: No mediastinal or hilar mass or adenopathy. The esophagus is normal. Lungs/Pleura: Numerous small bilateral pulmonary nodules consistent with metastatic disease. A few benign calcified granulomas are also noted. No acute pulmonary findings. No pleural effusion. Musculoskeletal: No significant bony findings. No lytic or sclerotic bone lesions to suggest osseous metastatic disease. CT ABDOMEN PELVIS FINDINGS Hepatobiliary: Diffuse hepatic metastatic disease. Index lesion at the hepatic dome on image number 43 measures 3.3 x 3.1 cm. Large conglomerate of masses occupying the right hepatic lobe on image number 54 measures 11.5 x 9.2 cm. Segment 3 lesion on image number 53 measures 2.9 x 2.2 cm. The gallbladder is grossly normal. No common bile duct dilatation. Pancreas: No pancreatic mass, inflammation or ductal dilatation but the pancreas is surrounded by bulky lymphadenopathy. Spleen: Normal size.  No focal lesions. Adrenals/Urinary Tract: The adrenal glands is thickened and there is a right adrenal gland lesion which is likely metastatic disease. Both kidneys are unremarkable.  No renal lesions or  hydronephrosis. Stomach/Bowel: The stomach, duodenum, and small bowel are unremarkable. No mass lesions or obstructive findings. The terminal ileum is normal. The appendix is normal. There is a large mass involving the right colon near the hepatic flexure which measures approximately 4.4 x 3.7 cm. There is tumor extending outside the colonic wall and invading the omental fat and also abuts the gallbladder. There is associated bulky and extensive periportal, celiac axis, gastrohepatic ligament and hepatic duodenum ligament lymphadenopathy. There is also bulky retroperitoneal adenopathy. Necrotic appearing large nodal mass surrounding the portal vein and hepatic artery measures 5.4 x 3.3 cm on image number 59. The portal vein is not occluded but significantly compressed. The splenic vein is patent. Index left-sided retroperitoneal lymph node on image number 67 measures 20 x 14 mm. The rectosigmoid lesions seen in sigmoidoscopy is best demonstrated on the coronal reformatted images on image number 99. This measures approximately 2.5 x 2.5 cm. No obstruction. Vascular/Lymphatic: Woke E adenopathy as described above. The aorta and branch vessels are patent. Moderate atherosclerotic calcifications. No aneurysm or dissection. Reproductive: There is a large mixed fatty lesion projecting off of the posterior myometrium most likely the degenerated fibroid with fatty change. I believe I can identify both ovaries as separate from this lesion so I do not think this is an ovarian dermoid. The lesion measures 8.9 x 8.1 cm and there is significant mass effect on the uterus and endometrium. The ovaries appear normal. Other: Small amount of free pelvic fluid. No pelvic lymphadenopathy. No inguinal lymphadenopathy. Small periumbilical abdominal wall hernia. No subcutaneous lesions. Musculoskeletal: No significant bony  findings. Advanced degenerative disc disease noted at L3-4. IMPRESSION: 1. Large mass involving the hepatic flexure  region of colon with CT evidence of direct invasion of the adjacent omentum and associated bulky abdominal lymphadenopathy, hepatic metastatic disease and pulmonary metastatic disease. 2. A second colonic lesion is noted at the rectosigmoid junction as demonstrated on the colonoscopy. This measures 2.5 x 2.5 cm. 3. Large uterine fibroid with fatty degeneration. 4. Bulky periportal lymphadenopathy is compressing the portal vein with potential risk of portal vein occlusion. Early portal venous collaterals are noted. Electronically Signed   By: Marijo Sanes M.D.   On: 09/10/2016 14:43   Ct Angio Chest Pe W And/or Wo Contrast  Result Date: 09/20/2016 CLINICAL DATA:  Chest pain/pressure, onset today. Recent diagnosis of colon cancer. EXAM: CT ANGIOGRAPHY CHEST WITH CONTRAST TECHNIQUE: Multidetector CT imaging of the chest was performed using the standard protocol during bolus administration of intravenous contrast. Multiplanar CT image reconstructions and MIPs were obtained to evaluate the vascular anatomy. CONTRAST:  Seventy-five cc Isovue 100 IV COMPARISON:  Radiographs earlier this day. Chest CT 10 days prior 09/10/2016 FINDINGS: Cardiovascular: There are no filling defects within the pulmonary arteries to suggest pulmonary embolus. No aortic dissection or aneurysm. Mild aortic atherosclerosis. Heart is normal in size. Scattered coronary artery calcifications. Mediastinum/Nodes: No pericardial effusion. No mediastinal or hilar adenopathy. Visualized thyroid gland is unremarkable. The esophagus is decompressed. Lungs/Pleura: No evidence of pulmonary edema or focal airspace disease. No pleural fluid. Multiple subcentimeter pulmonary nodules are scattered throughout both lungs, unchanged from recent prior exam. Upper Abdomen: Hepatic metastatic disease again seen. Porta hepatis adenopathy again seen. Upper abdomen better assessed on prior exam due to phase of contrast. Musculoskeletal: No blastic or evidence of  lytic osseous lesions. No acute osseous abnormality. Review of the MIP images confirms the above findings. IMPRESSION: 1. No pulmonary embolus. 2. No acute abnormality. Multiple subcentimeter pulmonary nodules consistent with metastatic disease are unchanged from recent prior exam. 3. Mild aortic atherosclerosis.  Coronary artery calcifications. Electronically Signed   By: Jeb Levering M.D.   On: 09/20/2016 22:14   Ct Abdomen Pelvis W Contrast  Result Date: 09/10/2016 CLINICAL DATA:  Anemia.  Abnormal colonoscopy. EXAM: CT CHEST, ABDOMEN, AND PELVIS WITH CONTRAST TECHNIQUE: Multidetector CT imaging of the chest, abdomen and pelvis was performed following the standard protocol during bolus administration of intravenous contrast. CONTRAST:  101mL ISOVUE-300 IOPAMIDOL (ISOVUE-300) INJECTION 61% COMPARISON:  None. FINDINGS: CT CHEST FINDINGS Chest wall: No breast masses are identified. No supraclavicular or axillary lymphadenopathy. Small scattered lymph nodes are noted. The thyroid gland is normal. Cardiovascular: The heart is normal in size. No pericardial effusion. The aorta is normal in caliber. No dissection. The branch vessels are patent. Minimal scattered coronary artery calcifications. The pulmonary arteries appear normal. Mediastinum/Nodes: No mediastinal or hilar mass or adenopathy. The esophagus is normal. Lungs/Pleura: Numerous small bilateral pulmonary nodules consistent with metastatic disease. A few benign calcified granulomas are also noted. No acute pulmonary findings. No pleural effusion. Musculoskeletal: No significant bony findings. No lytic or sclerotic bone lesions to suggest osseous metastatic disease. CT ABDOMEN PELVIS FINDINGS Hepatobiliary: Diffuse hepatic metastatic disease. Index lesion at the hepatic dome on image number 43 measures 3.3 x 3.1 cm. Large conglomerate of masses occupying the right hepatic lobe on image number 54 measures 11.5 x 9.2 cm. Segment 3 lesion on image number 53  measures 2.9 x 2.2 cm. The gallbladder is grossly normal. No common bile duct dilatation. Pancreas: No pancreatic mass,  inflammation or ductal dilatation but the pancreas is surrounded by bulky lymphadenopathy. Spleen: Normal size.  No focal lesions. Adrenals/Urinary Tract: The adrenal glands is thickened and there is a right adrenal gland lesion which is likely metastatic disease. Both kidneys are unremarkable.  No renal lesions or hydronephrosis. Stomach/Bowel: The stomach, duodenum, and small bowel are unremarkable. No mass lesions or obstructive findings. The terminal ileum is normal. The appendix is normal. There is a large mass involving the right colon near the hepatic flexure which measures approximately 4.4 x 3.7 cm. There is tumor extending outside the colonic wall and invading the omental fat and also abuts the gallbladder. There is associated bulky and extensive periportal, celiac axis, gastrohepatic ligament and hepatic duodenum ligament lymphadenopathy. There is also bulky retroperitoneal adenopathy. Necrotic appearing large nodal mass surrounding the portal vein and hepatic artery measures 5.4 x 3.3 cm on image number 59. The portal vein is not occluded but significantly compressed. The splenic vein is patent. Index left-sided retroperitoneal lymph node on image number 67 measures 20 x 14 mm. The rectosigmoid lesions seen in sigmoidoscopy is best demonstrated on the coronal reformatted images on image number 99. This measures approximately 2.5 x 2.5 cm. No obstruction. Vascular/Lymphatic: Woke E adenopathy as described above. The aorta and branch vessels are patent. Moderate atherosclerotic calcifications. No aneurysm or dissection. Reproductive: There is a large mixed fatty lesion projecting off of the posterior myometrium most likely the degenerated fibroid with fatty change. I believe I can identify both ovaries as separate from this lesion so I do not think this is an ovarian dermoid. The lesion  measures 8.9 x 8.1 cm and there is significant mass effect on the uterus and endometrium. The ovaries appear normal. Other: Small amount of free pelvic fluid. No pelvic lymphadenopathy. No inguinal lymphadenopathy. Small periumbilical abdominal wall hernia. No subcutaneous lesions. Musculoskeletal: No significant bony findings. Advanced degenerative disc disease noted at L3-4. IMPRESSION: 1. Large mass involving the hepatic flexure region of colon with CT evidence of direct invasion of the adjacent omentum and associated bulky abdominal lymphadenopathy, hepatic metastatic disease and pulmonary metastatic disease. 2. A second colonic lesion is noted at the rectosigmoid junction as demonstrated on the colonoscopy. This measures 2.5 x 2.5 cm. 3. Large uterine fibroid with fatty degeneration. 4. Bulky periportal lymphadenopathy is compressing the portal vein with potential risk of portal vein occlusion. Early portal venous collaterals are noted. Electronically Signed   By: Marijo Sanes M.D.   On: 09/10/2016 14:43   Mm Digital Screening Bilateral  Result Date: 08/26/2016 CLINICAL DATA:  Screening. EXAM: DIGITAL SCREENING BILATERAL MAMMOGRAM WITH CAD COMPARISON:  Previous exam(s). ACR Breast Density Category b: There are scattered areas of fibroglandular density. FINDINGS: There are no findings suspicious for malignancy. Images were processed with CAD. IMPRESSION: No mammographic evidence of malignancy. A result letter of this screening mammogram will be mailed directly to the patient. RECOMMENDATION: Screening mammogram in one year. (Code:SM-B-01Y) BI-RADS CATEGORY  1: Negative. Electronically Signed   By: Lajean Manes M.D.   On: 08/26/2016 11:58   US Abdomen Limited Ruq  Result Date: 09/21/2016 CLINICAL DATA:  Elevated liver enzymes.  History of colon cancer . EXAM: US ABDOMEN LIMITED - RIGHT UPPER QUADRANT COMPARISON:  CT 09/10/2016 . FINDINGS: Gallbladder: Gallbladder is contracted. Gallbladder wall is  thickened at 7.3 mm. This may be from contracted state however cholecystitis cannot be excluded. Hyperproteinemia can also present this fashion . No gallstones noted . Common bile duct: Diameter: 4.0 mm  Liver: Liver has a heterogeneous echotexture with multiple solid liver lesions again noted. The largest measures 5.0 x 4.3 x 6.5 cm and is in the right hepatic lobe. A 3.8 cm mass is noted within or adjacent to the left hepatic lobe as well. These findings are consistent with metastatic disease. IMPRESSION: 1. No gallstones. Gallbladder wall is thickened. This may be from partially contracted state. This could also be from hypoproteinemia cholecystitis. No biliary distention noted. 2. Multiple hepatic lesions consistent with metastatic disease . A a mass lesion in the periphery of the left hepatic lobe or adjacent to the left hepatic lobe is noted. This also most likely a exophytic metastatic lesion in the liver versus adjacent adenopathy. Electronically Signed   By: Marcello Moores  Register   On: 09/21/2016 14:06    Echo: - Upper normal LV wall thickness with LVEF 60-65% and grade 1   diastolic dysfunction. Trivial mitral regurgitation. Mildly   sclerotic aortic valve with trivial aortic regurgitation. Normal   estimated PASP 24 mmHg.   Subjective: No chest discomfort. No shortness of breath. Feels good.  Discharge Exam: Vitals:   09/22/16 0526 09/22/16 1304  BP: 133/86 (!) 152/89  Pulse: 84 78  Resp: 18 20  Temp: 97.8 F (36.6 C) 97.8 F (36.6 C)   Vitals:   09/21/16 1559 09/21/16 2200 09/22/16 0526 09/22/16 1304  BP: 118/75 127/79 133/86 (!) 152/89  Pulse: 76 74 84 78  Resp: 18 18 18 20   Temp:  99 F (37.2 C) 97.8 F (36.6 C) 97.8 F (36.6 C)  TempSrc:  Oral Oral Oral  SpO2: 100% 100% 100% 100%  Weight:      Height:        General: Pt is alert, awake, not in acute distress Cardiovascular: RRR, S1/S2 +, no rubs, no gallops Respiratory: CTA bilaterally, no wheezing, no  rhonchi Abdominal: Soft, NT, ND, bowel sounds + Extremities: no edema, no cyanosis    The results of significant diagnostics from this hospitalization (including imaging, microbiology, ancillary and laboratory) are listed below for reference.     Microbiology: No results found for this or any previous visit (from the past 240 hour(s)).   Labs: BNP (last 3 results) No results for input(s): BNP in the last 8760 hours. Basic Metabolic Panel:  Recent Labs Lab 09/20/16 1945 09/22/16 0555  NA 141 135  K 3.8 4.0  CL 108 105  CO2 23 23  GLUCOSE 152* 105*  BUN 29* 26*  CREATININE 1.50* 1.28*  CALCIUM 9.2 8.6*   Liver Function Tests:  Recent Labs Lab 09/20/16 1945 09/22/16 0555  AST 356* 249*  ALT 257* 242*  ALKPHOS 573* 582*  BILITOT 1.7* 3.1*  PROT 7.6 7.1  ALBUMIN 2.9* 2.6*    Recent Labs Lab 09/21/16 1225  LIPASE 23   No results for input(s): AMMONIA in the last 168 hours. CBC:  Recent Labs Lab 09/20/16 1945 09/22/16 0555  WBC 14.4* 15.8*  HGB 7.9* 7.7*  HCT 25.6* 25.3*  MCV 77.3* 77.8*  PLT 621* 646*   Cardiac Enzymes:  Recent Labs Lab 09/20/16 1945 09/21/16 1225 09/21/16 1915 09/22/16 0017  TROPONINI <0.03 <0.03 <0.03 <0.03   BNP: Invalid input(s): POCBNP CBG: No results for input(s): GLUCAP in the last 168 hours. D-Dimer  Recent Labs  09/20/16 2023  DDIMER 2.90*   Hgb A1c No results for input(s): HGBA1C in the last 72 hours. Lipid Profile No results for input(s): CHOL, HDL, LDLCALC, TRIG, CHOLHDL, LDLDIRECT in the last 72  hours. Thyroid function studies No results for input(s): TSH, T4TOTAL, T3FREE, THYROIDAB in the last 72 hours.  Invalid input(s): FREET3 Anemia work up  Recent Labs  09/21/16 1225  VITAMINB12 802  FOLATE 11.1  FERRITIN 49  TIBC 291  IRON 16*  RETICCTPCT 3.1   Urinalysis No results found for: COLORURINE, APPEARANCEUR, LABSPEC, [place], GLUCOSEU, HGBUR, BILIRUBINUR, KETONESUR, PROTEINUR, UROBILINOGEN,  NITRITE, LEUKOCYTESUR Sepsis Labs Invalid input(s): PROCALCITONIN,  WBC,  LACTICIDVEN Microbiology No results found for this or any previous visit (from the past 240 hour(s)).   Time coordinating discharge: Over 30 minutes  SIGNED:   Kathie Dike, MD  [facility] 09/22/2016, 3:46 PM Pager   If 7PM-7AM, please contact night-coverage [email or web address] Password [email or web address]

## 2016-09-22 NOTE — Progress Notes (Signed)
Lisa Guerra discharged Home per MD order.  Discharge instructions reviewed and discussed with the patient, all questions and concerns answered. Copy of instructions and scripts given to patient.  Allergies as of 09/22/2016   No Known Allergies     Medication List    STOP taking these medications   atorvastatin 10 MG tablet Commonly known as:  LIPITOR   lisinopril-hydrochlorothiazide 20-12.5 MG tablet Commonly known as:  PRINZIDE,ZESTORETIC     TAKE these medications   amLODipine 5 MG tablet Commonly known as:  NORVASC Take 5 mg by mouth daily.   aspirin EC 81 MG tablet Take 162 mg by mouth every 6 (six) hours as needed for mild pain.   ferrous sulfate 325 (65 FE) MG tablet Take 1 tablet (325 mg total) by mouth 3 (three) times daily with meals.       Patients skin is clean, dry and intact, no evidence of skin break down. IV site discontinued and catheter remains intact. Site without signs and symptoms of complications. Dressing and pressure applied.  Patient escorted to car by NT in a wheelchair,  no distress noted upon discharge.  Lisa Guerra 09/22/2016 6:07 PM

## 2016-09-22 NOTE — Consult Note (Signed)
Referring Provider: Dr. Roderic Palau Primary Care Physician:  [name], MD Primary Gastroenterologist:  Dr. Earle Gell, [name]   Date of Admission: 09/20/16 Date of Consultation: 09/22/16  Reason for Consultation:  Elevated LFTs, concern for portal vein thrombosis   HPI:  Lisa Guerra is an extremely pleasant 71 y.o. year old female who unfortunately recently was diagnosed with metastatic colon cancer after colonoscopy Sep 07, 2016 by [facility], revealing a large mass at the cecum (adenocarcinoma) and a multilobulated ulcerated polypoid lesion 20 cm from anal verge (tubular adenoma with at least high-grade dysplasia, 2 polyps removed from descending colon (tubular adenomas, one with high-grade dysplasia). Staging with CT chest/abd/pelvis revealed numerous small pulmonary nodules consistent with metastases, extensive liver metastases, necrotic appearing large nodal mass surrounding the portal vein and hepatic artery showing portal vein not occluded but significantly compressed. Early portal venous collaterals noted.  She was seen by Dr. Benay Spice with Oncology on 2/19 and felt to have synchronous right and left colon primaries. Liver biopsy arranged for 3/1 with plans for systemic therapy but with understanding this is not curative.    US abdomen yesterday with thickened gallbladder, CBD diameter 4.41m. One week ago LFTs normal except for isolated alk phos of 291. On admission, LFTs worsened in mixed pattern with alk phos 573, AST 356, ALT 257, Tbili 1.7. Bilirubin increased to 3.1 today but transaminases slightly improved, alk phos remains elevated in the upper 500 range.   Presented to ED with chest pressure that has now resolved. Troponin has remained normal. She denies any abdominal pain, N/V. Has a good appetite. Denies constipation/diarrhea. [facility]. Notes occasional low-volume hematochezia. No confusion or mental status changes. Husband at bedside.    Past Medical History:  Diagnosis Date  . Anemia   . [place]   . [place]   . High cholesterol   . Hypertension   . Low hemoglobin     Past Surgical History:  Procedure Laterality Date  . CESAREAN SECTION     x2   . COLONOSCOPY N/A 09/07/2016   [facility]: two 5 mm polyps in descending colon, large tumor consistent with adenocarcinoma filled cecum/ascending colon, large multi-lobulated ulcerated polypoid lesion at recto-sigmoid junction 20 cm from anal verge.   . TONSILLECTOMY    . TUBAL LIGATION      Prior to Admission medications   Medication Sig Start Date End Date Taking? Authorizing Provider  amLODipine (NORVASC) 5 MG tablet Take 5 mg by mouth daily.   Yes Historical Provider, MD  aspirin EC 81 MG tablet Take 162 mg by mouth every 6 (six) hours as needed for mild pain.   Yes Historical Provider, MD  atorvastatin (LIPITOR) 10 MG tablet Take 10 mg by mouth every evening.    Yes Historical Provider, MD  ferrous sulfate 325 (65 FE) MG tablet Take 1 tablet (325 mg total) by mouth 3 (three) times daily with meals. 09/14/16  Yes GLadell Pier MD  lisinopril-hydrochlorothiazide (PRINZIDE,ZESTORETIC) 20-12.5 MG per tablet Take 1 tablet by mouth daily.   Yes Historical Provider, MD    Current Facility-Administered Medications  Medication Dose Route Frequency Provider Last Rate Last Dose  . 0.9 %  sodium chloride infusion   Intravenous Continuous JKathie Dike MD 125 mL/hr at 09/22/16 0523    . acetaminophen (TYLENOL) tablet 650 mg  650 mg Oral Q4H PRN RPhillips Grout MD   650 mg at 09/22/16 0332  .  amLODipine (NORVASC) tablet 5 mg  5 mg Oral Daily Phillips Grout, MD   5 mg at 09/22/16 0853  . aspirin chewable tablet 324 mg  324 mg Oral Once Merrily Pew, MD      . aspirin EC tablet 162 mg  162 mg Oral Q6H PRN Phillips Grout, MD      . enoxaparin (LOVENOX) injection 40 mg  40 mg Subcutaneous Q24H Kathie Dike, MD   40 mg at 09/21/16 2030  . ferrous  sulfate tablet 325 mg  325 mg Oral TID WC Phillips Grout, MD   325 mg at 09/22/16 0853  . gi cocktail (Maalox,Lidocaine,Donnatal)  30 mL Oral QID PRN Phillips Grout, MD      . multivitamin with minerals tablet 1 tablet  1 tablet Oral Daily Kathie Dike, MD   1 tablet at 09/22/16 0853  . ondansetron (ZOFRAN) injection 4 mg  4 mg Intravenous Q6H PRN Phillips Grout, MD        Allergies as of 09/20/2016  . (No Known Allergies)    Family History  Problem Relation Age of Onset  . Pancreatic cancer Mother   . Leukemia Father   . Colon cancer Brother     Social History   Social History  . Marital status: Married    Spouse name: N/A  . Number of children: N/A  . Years of education: N/A   Occupational History  . Not on file.   Social History Main Topics  . Smoking status: Never Smoker  . Smokeless tobacco: Never Used  . Alcohol use No  . Drug use: No  . Sexual activity: Yes   Other Topics Concern  . Not on file   Social History Narrative  . No narrative on file    Review of Systems: Gen: Denies fever, chills, loss of appetite, change in weight or weight loss CV: see HPI  Resp: Denies shortness of breath with rest, cough, wheezing GI: see HPI  GU : Denies urinary burning, urinary frequency, urinary incontinence.  MS: Denies joint pain,swelling, cramping Derm: Denies rash, itching, dry skin Psych: Denies depression, anxiety,confusion, or memory loss Heme: see HPI   Physical Exam: Vital signs in last 24 hours: Temp:  [97.8 F (36.6 C)-99 F (37.2 C)] 97.8 F (36.6 C) (02/27 0526) Pulse Rate:  [74-87] 84 (02/27 0526) Resp:  [18-20] 18 (02/27 0526) BP: (118-133)/(67-86) 133/86 (02/27 0526) SpO2:  [99 %-100 %] 100 % (02/27 0526) Last BM Date: 09/21/16 General:   Alert, thin, sallow-appearing Head:  Normocephalic and atraumatic. Ears:  Normal auditory acuity. Nose:  No deformity, discharge,  or lesions. Mouth:  No deformity or lesions, dentition normal Lungs:   Clear throughout to auscultation.   Heart:  Regular rate and rhythm; no murmurs, clicks, rubs,  or gallops. Abdomen:  Soft, nontender and nondistended.Normal bowel sounds, without guarding, and without rebound.   Rectal:  Deferred  Msk:  Symmetrical without gross deformities. Normal posture. Extremities:  Without  edema. Neurologic:  Alert and  oriented x4 Psych:  Alert and cooperative. Normal mood and affect.  Intake/Output from previous day: 02/26 0701 - 02/27 0700 In: 1832.5 [P.O.:720; I.V.:1112.5] Out: 1050 [Urine:1050] Intake/Output this shift: Total I/O In: 240 [P.O.:240] Out: -   Lab Results:  Recent Labs  09/20/16 1945 09/22/16 0555  WBC 14.4* 15.8*  HGB 7.9* 7.7*  HCT 25.6* 25.3*  PLT 621* 646*   BMET  Recent Labs  09/20/16 1945 09/22/16 0555  NA 141  135  K 3.8 4.0  CL 108 105  CO2 23 23  GLUCOSE 152* 105*  BUN 29* 26*  CREATININE 1.50* 1.28*  CALCIUM 9.2 8.6*   LFT  Recent Labs  09/20/16 1945 09/22/16 0555  PROT 7.6 7.1  ALBUMIN 2.9* 2.6*  AST 356* 249*  ALT 257* 242*  ALKPHOS 573* 582*  BILITOT 1.7* 3.1*    Studies/Results: Dg Chest 2 View  Result Date: 09/20/2016 CLINICAL DATA:  Shortness of breath, chest pressure, nonproductive cough. EXAM: CHEST  2 VIEW COMPARISON:  CT 10 days prior 09/10/2016 FINDINGS: The cardiomediastinal contours are normal. Calcified granuloma at the right lung base. The pulmonary nodules on recent chest CT are not well delineated radiographically, 1 of the left lung nodules potentially identified. Pulmonary vasculature is normal. No consolidation, pleural effusion, or pneumothorax. No acute osseous abnormalities are seen. IMPRESSION: No acute abnormality visualized radiographically. Majority of the pulmonary nodules on recent chest CT are not visualized by radiograph. Electronically Signed   By: Jeb Levering M.D.   On: 09/20/2016 20:33   Ct Angio Chest Pe W And/or Wo Contrast  Result Date: 09/20/2016 CLINICAL  DATA:  Chest pain/pressure, onset today. Recent diagnosis of colon cancer. EXAM: CT ANGIOGRAPHY CHEST WITH CONTRAST TECHNIQUE: Multidetector CT imaging of the chest was performed using the standard protocol during bolus administration of intravenous contrast. Multiplanar CT image reconstructions and MIPs were obtained to evaluate the vascular anatomy. CONTRAST:  Seventy-five cc Isovue 100 IV COMPARISON:  Radiographs earlier this day. Chest CT 10 days prior 09/10/2016 FINDINGS: Cardiovascular: There are no filling defects within the pulmonary arteries to suggest pulmonary embolus. No aortic dissection or aneurysm. Mild aortic atherosclerosis. Heart is normal in size. Scattered coronary artery calcifications. Mediastinum/Nodes: No pericardial effusion. No mediastinal or hilar adenopathy. Visualized thyroid gland is unremarkable. The esophagus is decompressed. Lungs/Pleura: No evidence of pulmonary edema or focal airspace disease. No pleural fluid. Multiple subcentimeter pulmonary nodules are scattered throughout both lungs, unchanged from recent prior exam. Upper Abdomen: Hepatic metastatic disease again seen. Porta hepatis adenopathy again seen. Upper abdomen better assessed on prior exam due to phase of contrast. Musculoskeletal: No blastic or evidence of lytic osseous lesions. No acute osseous abnormality. Review of the MIP images confirms the above findings. IMPRESSION: 1. No pulmonary embolus. 2. No acute abnormality. Multiple subcentimeter pulmonary nodules consistent with metastatic disease are unchanged from recent prior exam. 3. Mild aortic atherosclerosis.  Coronary artery calcifications. Electronically Signed   By: Jeb Levering M.D.   On: 09/20/2016 22:14   US Abdomen Limited Ruq  Result Date: 09/21/2016 CLINICAL DATA:  Elevated liver enzymes.  History of colon cancer . EXAM: US ABDOMEN LIMITED - RIGHT UPPER QUADRANT COMPARISON:  CT 09/10/2016 . FINDINGS: Gallbladder: Gallbladder is contracted.  Gallbladder wall is thickened at 7.3 mm. This may be from contracted state however cholecystitis cannot be excluded. Hyperproteinemia can also present this fashion . No gallstones noted . Common bile duct: Diameter: 4.0 mm Liver: Liver has a heterogeneous echotexture with multiple solid liver lesions again noted. The largest measures 5.0 x 4.3 x 6.5 cm and is in the right hepatic lobe. A 3.8 cm mass is noted within or adjacent to the left hepatic lobe as well. These findings are consistent with metastatic disease. IMPRESSION: 1. No gallstones. Gallbladder wall is thickened. This may be from partially contracted state. This could also be from hypoproteinemia cholecystitis. No biliary distention noted. 2. Multiple hepatic lesions consistent with metastatic disease . A a mass lesion  in the periphery of the left hepatic lobe or adjacent to the left hepatic lobe is noted. This also most likely a exophytic metastatic lesion in the liver versus adjacent adenopathy. Electronically Signed   By: Marcello Moores  Register   On: 09/21/2016 14:06    Impression: 71 year old pleasant female recently found to have metastatic colon cancer, presenting with acute onset chest pain but negative troponins. Chest pain has resolved, and she has no other GI concerns. Liver numbers with mixed pattern elevation in contrast to a week ago in setting of known metastatic disease. Discussed with Dr. Candise Che, radiologist who read CT 09/10/16. 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 US abdomen this admission with CBD 4 mm and known findings of multiple hepatic lesions. Unfortunate prognosis for this nice lady; no obvious portal vein thrombosis after review with radiology. I note she has an appointment upcoming for liver biopsy and desires to remain with oncologist through [facility] in [place].   Plan: Follow LFTs Continue with plan as outlined by primary GI in [facility] and  Oncology I have given a note to her to excuse from jury duty this upcoming Monday and for her son, who is a support person and requesting excuse for work.   Annitta Needs, ANP-BC [facility]     LOS: 0 days    09/22/2016, 10:58 AM

## 2016-09-23 ENCOUNTER — Ambulatory Visit (HOSPITAL_COMMUNITY): Payer: Medicare Other

## 2016-09-23 ENCOUNTER — Other Ambulatory Visit: Payer: Self-pay | Admitting: Radiology

## 2016-09-24 ENCOUNTER — Observation Stay (HOSPITAL_COMMUNITY): Payer: Medicare Other

## 2016-09-24 ENCOUNTER — Emergency Department (HOSPITAL_COMMUNITY): Payer: Medicare Other

## 2016-09-24 ENCOUNTER — Encounter (HOSPITAL_COMMUNITY): Payer: Self-pay | Admitting: Emergency Medicine

## 2016-09-24 ENCOUNTER — Ambulatory Visit (HOSPITAL_COMMUNITY): Payer: Medicare Other

## 2016-09-24 ENCOUNTER — Inpatient Hospital Stay (HOSPITAL_COMMUNITY)
Admission: RE | Admit: 2016-09-24 | Discharge: 2016-09-24 | Disposition: A | Discharge status: Home / Self Care | Payer: Medicare Other | Source: Ambulatory Visit | Attending: Oncology | Admitting: Oncology

## 2016-09-24 ENCOUNTER — Inpatient Hospital Stay (HOSPITAL_COMMUNITY)
Admission: EM | Admit: 2016-09-24 | Discharge: 2016-10-01 | DRG: 811 | Disposition: A | Discharge status: Home / Self Care | Payer: Medicare Other | Attending: Oncology | Admitting: Oncology

## 2016-09-24 DIAGNOSIS — C799 Secondary malignant neoplasm of unspecified site: Secondary | ICD-10-CM | POA: Diagnosis not present

## 2016-09-24 DIAGNOSIS — Z681 Body mass index (BMI) 19 or less, adult: Secondary | ICD-10-CM

## 2016-09-24 DIAGNOSIS — C7800 Secondary malignant neoplasm of unspecified lung: Secondary | ICD-10-CM | POA: Diagnosis present

## 2016-09-24 DIAGNOSIS — K838 Other specified diseases of biliary tract: Secondary | ICD-10-CM | POA: Diagnosis present

## 2016-09-24 DIAGNOSIS — Z79899 Other long term (current) drug therapy: Secondary | ICD-10-CM

## 2016-09-24 DIAGNOSIS — R627 Adult failure to thrive: Secondary | ICD-10-CM | POA: Diagnosis present

## 2016-09-24 DIAGNOSIS — D630 Anemia in neoplastic disease: Principal | ICD-10-CM | POA: Diagnosis present

## 2016-09-24 DIAGNOSIS — R7401 Elevation of levels of liver transaminase levels: Secondary | ICD-10-CM

## 2016-09-24 DIAGNOSIS — Z7982 Long term (current) use of aspirin: Secondary | ICD-10-CM

## 2016-09-24 DIAGNOSIS — D72829 Elevated white blood cell count, unspecified: Secondary | ICD-10-CM | POA: Diagnosis present

## 2016-09-24 DIAGNOSIS — Z800 Family history of malignant neoplasm of digestive organs: Secondary | ICD-10-CM

## 2016-09-24 DIAGNOSIS — E43 Unspecified severe protein-calorie malnutrition: Secondary | ICD-10-CM | POA: Diagnosis present

## 2016-09-24 DIAGNOSIS — D649 Anemia, unspecified: Secondary | ICD-10-CM | POA: Diagnosis present

## 2016-09-24 DIAGNOSIS — I10 Essential (primary) hypertension: Secondary | ICD-10-CM | POA: Diagnosis present

## 2016-09-24 DIAGNOSIS — R590 Localized enlarged lymph nodes: Secondary | ICD-10-CM | POA: Diagnosis present

## 2016-09-24 DIAGNOSIS — R6881 Early satiety: Secondary | ICD-10-CM | POA: Diagnosis present

## 2016-09-24 DIAGNOSIS — R0789 Other chest pain: Secondary | ICD-10-CM | POA: Diagnosis present

## 2016-09-24 DIAGNOSIS — C7971 Secondary malignant neoplasm of right adrenal gland: Secondary | ICD-10-CM | POA: Diagnosis present

## 2016-09-24 DIAGNOSIS — C182 Malignant neoplasm of ascending colon: Secondary | ICD-10-CM | POA: Diagnosis present

## 2016-09-24 DIAGNOSIS — R740 Nonspecific elevation of levels of transaminase and lactic acid dehydrogenase [LDH]: Secondary | ICD-10-CM

## 2016-09-24 DIAGNOSIS — D500 Iron deficiency anemia secondary to blood loss (chronic): Secondary | ICD-10-CM | POA: Diagnosis not present

## 2016-09-24 DIAGNOSIS — Z7189 Other specified counseling: Secondary | ICD-10-CM

## 2016-09-24 DIAGNOSIS — E785 Hyperlipidemia, unspecified: Secondary | ICD-10-CM | POA: Diagnosis present

## 2016-09-24 DIAGNOSIS — C787 Secondary malignant neoplasm of liver and intrahepatic bile duct: Secondary | ICD-10-CM | POA: Diagnosis present

## 2016-09-24 DIAGNOSIS — D638 Anemia in other chronic diseases classified elsewhere: Secondary | ICD-10-CM | POA: Diagnosis present

## 2016-09-24 DIAGNOSIS — D259 Leiomyoma of uterus, unspecified: Secondary | ICD-10-CM | POA: Diagnosis present

## 2016-09-24 DIAGNOSIS — C189 Malignant neoplasm of colon, unspecified: Secondary | ICD-10-CM | POA: Diagnosis not present

## 2016-09-24 DIAGNOSIS — R748 Abnormal levels of other serum enzymes: Secondary | ICD-10-CM

## 2016-09-24 DIAGNOSIS — E7800 Pure hypercholesterolemia, unspecified: Secondary | ICD-10-CM | POA: Diagnosis present

## 2016-09-24 HISTORY — PX: IR GENERIC HISTORICAL: IMG1180011

## 2016-09-24 LAB — CBC
HCT: 21.5 % — ABNORMAL LOW (ref 36.0–46.0)
Hemoglobin: 6.6 g/dL — CL (ref 12.0–15.0)
MCH: 23.2 pg — ABNORMAL LOW (ref 26.0–34.0)
MCHC: 30.7 g/dL (ref 30.0–36.0)
MCV: 75.4 fL — ABNORMAL LOW (ref 78.0–100.0)
Platelets: 565 10*3/uL — ABNORMAL HIGH (ref 150–400)
RBC: 2.85 MIL/uL — AB (ref 3.87–5.11)
RDW: 19.6 % — ABNORMAL HIGH (ref 11.5–15.5)
WBC: 15.8 10*3/uL — ABNORMAL HIGH (ref 4.0–10.5)

## 2016-09-24 LAB — TSH: TSH: 2.084 u[IU]/mL (ref 0.350–4.500)

## 2016-09-24 LAB — PROTIME-INR
INR: 1.06
Prothrombin Time: 13.9 seconds (ref 11.4–15.2)

## 2016-09-24 LAB — COMPREHENSIVE METABOLIC PANEL
ALBUMIN: 2.5 g/dL — AB (ref 3.5–5.0)
ALK PHOS: 639 U/L — AB (ref 38–126)
ALT: 171 U/L — AB (ref 14–54)
AST: 164 U/L — AB (ref 15–41)
Anion gap: 10 (ref 5–15)
BUN: 30 mg/dL — AB (ref 6–20)
CALCIUM: 8.7 mg/dL — AB (ref 8.9–10.3)
CO2: 21 mmol/L — AB (ref 22–32)
CREATININE: 1.19 mg/dL — AB (ref 0.44–1.00)
Chloride: 105 mmol/L (ref 101–111)
GFR calc Af Amer: 53 mL/min — ABNORMAL LOW (ref >60)
GFR calc non Af Amer: 46 mL/min — ABNORMAL LOW (ref >60)
GLUCOSE: 88 mg/dL (ref 65–99)
Potassium: 4.1 mmol/L (ref 3.5–5.1)
SODIUM: 136 mmol/L (ref 135–145)
Total Bilirubin: 5.5 mg/dL — ABNORMAL HIGH (ref 0.3–1.2)
Total Protein: 6.8 g/dL (ref 6.5–8.1)

## 2016-09-24 LAB — I-STAT TROPONIN, ED: Troponin i, poc: 0 ng/mL (ref 0.00–0.08)

## 2016-09-24 LAB — MAGNESIUM: Magnesium: 2 mg/dL (ref 1.7–2.4)

## 2016-09-24 LAB — ABO/RH: ABO/RH(D): O POS

## 2016-09-24 LAB — PREPARE RBC (CROSSMATCH)

## 2016-09-24 MED ORDER — CEFAZOLIN SODIUM-DEXTROSE 2-4 GM/100ML-% IV SOLN
INTRAVENOUS | Status: AC
  Filled 2016-09-24: qty 100

## 2016-09-24 MED ORDER — ASPIRIN EC 81 MG PO TBEC: 162.0000 mg | DELAYED_RELEASE_TABLET | Freq: Four times a day (QID) | ORAL | Status: DC | PRN

## 2016-09-24 MED ORDER — CEFAZOLIN SODIUM-DEXTROSE 2-4 GM/100ML-% IV SOLN
2.0000 g | INTRAVENOUS | Status: AC
  Administered 2016-09-24: 2 g via INTRAVENOUS
  Filled 2016-09-24: qty 100

## 2016-09-24 MED ORDER — HEPARIN SODIUM (PORCINE) 5000 UNIT/ML IJ SOLN
5000.0000 [IU] | Freq: Three times a day (TID) | INTRAMUSCULAR | Status: DC
  Administered 2016-09-24 – 2016-09-25 (×2): 5000 [IU] via SUBCUTANEOUS
  Filled 2016-09-24 (×2): qty 1

## 2016-09-24 MED ORDER — ONDANSETRON HCL 4 MG PO TABS: 4.0000 mg | ORAL_TABLET | Freq: Four times a day (QID) | ORAL | Status: DC | PRN

## 2016-09-24 MED ORDER — ONDANSETRON HCL 4 MG/2ML IJ SOLN
4.0000 mg | Freq: Four times a day (QID) | INTRAMUSCULAR | Status: DC | PRN
  Administered 2016-10-01: 4 mg via INTRAVENOUS
  Filled 2016-09-24: qty 2

## 2016-09-24 MED ORDER — MIDAZOLAM HCL 2 MG/2ML IJ SOLN
INTRAMUSCULAR | Status: AC
  Filled 2016-09-24: qty 6

## 2016-09-24 MED ORDER — ATORVASTATIN CALCIUM 10 MG PO TABS
10.0000 mg | ORAL_TABLET | Freq: Every day | ORAL | Status: DC
  Administered 2016-09-24 – 2016-09-25 (×2): 10 mg via ORAL
  Filled 2016-09-24 (×2): qty 1

## 2016-09-24 MED ORDER — FENTANYL CITRATE (PF) 100 MCG/2ML IJ SOLN
INTRAMUSCULAR | Status: AC | PRN
  Administered 2016-09-24 (×3): 25 ug via INTRAVENOUS

## 2016-09-24 MED ORDER — IBUPROFEN 200 MG PO TABS
600.0000 mg | ORAL_TABLET | Freq: Once | ORAL | Status: AC
  Administered 2016-09-24: 600 mg via ORAL
  Filled 2016-09-24: qty 3

## 2016-09-24 MED ORDER — FENTANYL CITRATE (PF) 100 MCG/2ML IJ SOLN
INTRAMUSCULAR | Status: AC
  Filled 2016-09-24: qty 4

## 2016-09-24 MED ORDER — LEVALBUTEROL HCL 0.63 MG/3ML IN NEBU: 0.6300 mg | INHALATION_SOLUTION | Freq: Four times a day (QID) | RESPIRATORY_TRACT | Status: DC | PRN

## 2016-09-24 MED ORDER — LIDOCAINE-EPINEPHRINE (PF) 2 %-1:200000 IJ SOLN
INTRAMUSCULAR | Status: AC | PRN
  Administered 2016-09-24: 10 mL via INTRADERMAL

## 2016-09-24 MED ORDER — FERROUS SULFATE 325 (65 FE) MG PO TABS
325.0000 mg | ORAL_TABLET | Freq: Three times a day (TID) | ORAL | Status: DC
  Administered 2016-09-24 – 2016-10-01 (×20): 325 mg via ORAL
  Filled 2016-09-24 (×20): qty 1

## 2016-09-24 MED ORDER — MIDAZOLAM HCL 2 MG/2ML IJ SOLN
INTRAMUSCULAR | Status: AC | PRN
  Administered 2016-09-24 (×3): 1 mg via INTRAVENOUS

## 2016-09-24 MED ORDER — SODIUM CHLORIDE 0.9% FLUSH
10.0000 mL | INTRAVENOUS | Status: DC | PRN
  Administered 2016-09-26 – 2016-09-27 (×3): 10 mL
  Filled 2016-09-24 (×3): qty 40

## 2016-09-24 MED ORDER — SODIUM CHLORIDE 0.9 % IV SOLN
INTRAVENOUS | Status: DC
  Administered 2016-09-24 – 2016-10-01 (×7): via INTRAVENOUS

## 2016-09-24 MED ORDER — LIDOCAINE-EPINEPHRINE (PF) 2 %-1:200000 IJ SOLN
INTRAMUSCULAR | Status: AC
  Filled 2016-09-24: qty 20

## 2016-09-24 MED ORDER — POLYETHYLENE GLYCOL 3350 17 G PO PACK: 17.0000 g | PACK | Freq: Every day | ORAL | Status: DC | PRN

## 2016-09-24 MED ORDER — CEFAZOLIN SODIUM-DEXTROSE 2-4 GM/100ML-% IV SOLN: 2.0000 g | Freq: Once | INTRAVENOUS | Status: DC

## 2016-09-24 MED ORDER — AMLODIPINE BESYLATE 5 MG PO TABS
5.0000 mg | ORAL_TABLET | Freq: Every day | ORAL | Status: DC
  Administered 2016-09-24 – 2016-10-01 (×8): 5 mg via ORAL
  Filled 2016-09-24 (×8): qty 1

## 2016-09-24 MED ORDER — LIDOCAINE HCL 1 % IJ SOLN
INTRAMUSCULAR | Status: AC
  Filled 2016-09-24: qty 20

## 2016-09-24 MED ORDER — OXYCODONE HCL 5 MG PO TABS
5.0000 mg | ORAL_TABLET | ORAL | Status: DC | PRN
  Administered 2016-09-25: 5 mg via ORAL
  Filled 2016-09-24 (×2): qty 1

## 2016-09-24 MED ORDER — SODIUM CHLORIDE 0.9% FLUSH
3.0000 mL | Freq: Two times a day (BID) | INTRAVENOUS | Status: DC
  Administered 2016-09-28 – 2016-09-30 (×4): 3 mL via INTRAVENOUS

## 2016-09-24 MED ORDER — HEPARIN SOD (PORK) LOCK FLUSH 100 UNIT/ML IV SOLN
INTRAVENOUS | Status: AC
  Filled 2016-09-24: qty 5

## 2016-09-24 MED ORDER — SODIUM CHLORIDE 0.9 % IV SOLN
Freq: Once | INTRAVENOUS | Status: AC
  Administered 2016-09-24: 11:00:00 via INTRAVENOUS

## 2016-09-24 NOTE — H&P (Signed)
[facility] History and Physical  Lisa Guerra [id number] DOB: [birth year] DOA: 09/24/2016  Referring physician:  PCP: [name], MD   Chief Complaint: anemia   HPI:   71 y.o. year old female  with a history of anemia, hypertension, who   recently was diagnosed with metastatic colon cancer after colonoscopy Sep 07, 2016 by [facility], revealing a large mass at the cecum (adenocarcinoma)  . Staging with CT chest/abd/pelvis revealed numerous small pulmonary nodules consistent with metastases, extensive liver metastases.She was seen by Dr. Benay Spice with Oncology on 2/19 and felt to have synchronous right and left colon primaries. Liver biopsy arranged for 3/1 with plans for systemic therapy. She presented with complaints of pain under her right breast that started  several days ago associated with a dry cough. She denied shortness of breath. Patient was also scheduled for an ultrasound-guided biopsy today which was canceled because of anemia. Patient was asked to first get transfused, and reschedule biopsy for later. She denies any hematochezia or melena. Patient is being admitted for transfusion, prior to procedure by IR She was recently hospitalized at [facility] and discharged on 2/27, for chest pain. CT of the chest on 09/20/16 did not show any pulmonary embolus, multiple pulmonary nodules consistent with metastatic disease. Cardiac enzymes were found to be negative during that admission, EKG was unremarkable. Hemoglobin was 7.7 on 2/27 prior to discharge.       Review of Systems: negative for the following  Constitutional: Denies fever, chills, diaphoresis, decreased appetite change and  fatigue.  HEENT: Denies photophobia, eye pain, redness, hearing loss, ear pain, congestion, sore throat, rhinorrhea, sneezing, mouth sores, trouble swallowing, neck pain, neck stiffness and tinnitus.  Respiratory: Denies SOB, DOE, cough, chest tightness, and wheezing.  Cardiovascular: Positive for  right-sided chest pain, palpitations and leg swelling.  Gastrointestinal: Denies nausea, vomiting, abdominal pain, diarrhea, constipation, blood in stool and abdominal distention.  Genitourinary: Denies dysuria, urgency, frequency, hematuria, flank pain and difficulty urinating.  Musculoskeletal: Denies myalgias, back pain, joint swelling, arthralgias and gait problem.  Skin: Denies pallor, rash and wound.  Neurological: Denies dizziness, seizures, syncope, weakness, light-headedness, numbness and headaches.  Hematological: Denies adenopathy. Easy bruising, personal or family bleeding history  Psychiatric/Behavioral: Denies suicidal ideation, mood changes, confusion, nervousness, sleep disturbance and agitation       Past Medical History:  Diagnosis Date  . Anemia   . Cancer ([place])   . Colon cancer (Lisa Guerra)   . High cholesterol   . Hypertension   . Low hemoglobin      Past Surgical History:  Procedure Laterality Date  . CESAREAN SECTION     x2   . COLONOSCOPY N/A 09/07/2016   Dr. Wynetta Emery with [name]: two 5 mm polyps in descending colon, large tumor consistent with adenocarcinoma filled cecum/ascending colon, large multi-lobulated ulcerated polypoid lesion at recto-sigmoid junction 20 cm from anal verge.   . TONSILLECTOMY    . TUBAL LIGATION        Social History:  reports that she has never smoked. She has never used smokeless tobacco. She reports that she does not drink alcohol or use drugs.    No Known Allergies  Family History  Problem Relation Age of Onset  . Pancreatic cancer Mother   . Leukemia Father   . Colon cancer Brother         Prior to Admission medications   Medication Sig Start Date End Date Taking? Authorizing Provider  amLODipine (NORVASC) 5 MG tablet Take 5 mg  by mouth daily.   Yes Historical Provider, MD  aspirin EC 81 MG tablet Take 162 mg by mouth every 6 (six) hours as needed for mild pain.   Yes Historical Provider, MD  atorvastatin (LIPITOR)  10 MG tablet Take 1 tablet by mouth daily. 08/28/16  Yes Historical Provider, MD  ferrous sulfate 325 (65 FE) MG tablet Take 1 tablet (325 mg total) by mouth 3 (three) times daily with meals. 09/14/16  Yes Ladell Pier, MD  lisinopril-hydrochlorothiazide (PRINZIDE,ZESTORETIC) 20-12.5 MG tablet Take 1 tablet by mouth daily. 08/21/16  Yes Historical Provider, MD     Physical Exam: Vitals:   09/24/16 0600 09/24/16 0700 09/24/16 0800 09/24/16 0900  BP: 129/87 148/93 149/92 143/86  Pulse: 73 82 78 82  Resp: 17 19 20 20   Temp:      TempSrc:      SpO2: 99% 100% 98% 99%      Constitutional: NAD, calm, comfortable Vitals:   09/24/16 0600 09/24/16 0700 09/24/16 0800 09/24/16 0900  BP: 129/87 148/93 149/92 143/86  Pulse: 73 82 78 82  Resp: 17 19 20 20   Temp:      TempSrc:      SpO2: 99% 100% 98% 99%   Eyes: Conjunctivae and EOM are normal. Pupils are equal, round, and reactive to light.  No scleral icterus  Neck: Normal range of motion. Neck supple.  Cardiovascular: Normal rate, regular rhythm, normal heart sounds and intact distal pulses.   Pulmonary/Chest: Effort normal and breath sounds normal.  Abdominal: Soft. There is no tenderness. There is no rigidity, no rebound, no guarding, no CVA tenderness, no tenderness at McBurney's point and negative Murphy's sign.  Musculoskeletal: Normal range of motion.  Neurological: She is alert and oriented to person, place, and time.  Skin: Skin is warm and dry.  Mild jaundice appearing  Neurologic: CN 2-12 grossly intact. Sensation intact, DTR normal. Strength 5/5 in all 4.  Psychiatric: Normal judgment and insight. Alert and oriented x 3. Normal mood.     Labs on Admission: I have personally reviewed following labs and imaging studies  CBC:  Recent Labs Lab 09/20/16 1945 09/22/16 0555 09/24/16 0705  WBC 14.4* 15.8* 15.8*  HGB 7.9* 7.7* 6.6*  HCT 25.6* 25.3* 21.5*  MCV 77.3* 77.8* 75.4*  PLT 621* 646* 565*    Basic Metabolic  Panel:  Recent Labs Lab 09/20/16 1945 09/22/16 0555 09/24/16 0705  NA 141 135 136  K 3.8 4.0 4.1  CL 108 105 105  CO2 23 23 21*  GLUCOSE 152* 105* 88  BUN 29* 26* 30*  CREATININE 1.50* 1.28* 1.19*  CALCIUM 9.2 8.6* 8.7*    GFR: Estimated Creatinine Clearance: 32.4 mL/min (by C-G formula based on SCr of 1.19 mg/dL (H)).  Liver Function Tests:  Recent Labs Lab 09/20/16 1945 09/22/16 0555 09/24/16 0705  AST 356* 249* 164*  ALT 257* 242* 171*  ALKPHOS 573* 582* 639*  BILITOT 1.7* 3.1* 5.5*  PROT 7.6 7.1 6.8  ALBUMIN 2.9* 2.6* 2.5*    Recent Labs Lab 09/21/16 1225  LIPASE 23   No results for input(s): AMMONIA in the last 168 hours.  Coagulation Profile: No results for input(s): INR, PROTIME in the last 168 hours. No results for input(s): DDIMER in the last 72 hours.  Cardiac Enzymes:  Recent Labs Lab 09/20/16 1945 09/21/16 1225 09/21/16 1915 09/22/16 0017  TROPONINI <0.03 <0.03 <0.03 <0.03    BNP (last 3 results) No results for input(s): PROBNP in the last  8760 hours.  HbA1C: No results for input(s): HGBA1C in the last 72 hours. No results found for: HGBA1C   CBG: No results for input(s): GLUCAP in the last 168 hours.  Lipid Profile: No results for input(s): CHOL, HDL, LDLCALC, TRIG, CHOLHDL, LDLDIRECT in the last 72 hours.  Thyroid Function Tests: No results for input(s): TSH, T4TOTAL, FREET4, T3FREE, THYROIDAB in the last 72 hours.  Anemia Panel:  Recent Labs  09/21/16 1225  VITAMINB12 802  FOLATE 11.1  FERRITIN 49  TIBC 291  IRON 16*  RETICCTPCT 3.1    Urine analysis: No results found for: COLORURINE, APPEARANCEUR, LABSPEC, PHURINE, GLUCOSEU, HGBUR, BILIRUBINUR, KETONESUR, PROTEINUR, UROBILINOGEN, NITRITE, LEUKOCYTESUR  Sepsis Labs: @LABRCNTIP (procalcitonin:4,lacticidven:4) )No results found for this or any previous visit (from the past 240 hour(s)).       Radiological Exams on Admission: Dg Chest 2 View  Result Date:  09/24/2016 CLINICAL DATA:  New onset of right-sided chest pain last night. Two days of cough. EXAM: CHEST  2 VIEW COMPARISON:  CT scan of the chest of September 20, 2016 and chest x-ray of the same day. FINDINGS: The lungs are well-expanded. There is no pneumothorax or pneumomediastinum. Multiple subcentimeter nodules are again demonstrated bilaterally. The heart and pulmonary vascularity are normal. The mediastinum is normal in width. There is calcification in the wall of the aortic arch. 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 IMPRESSION: There is no pneumothorax, pleural effusion, nor pneumonia. Multiple subcentimeter pulmonary nodules remain. No CHF. Thoracic aortic atherosclerosis. Electronically Signed   By: David  Martinique M.D.   On: 09/24/2016 07:19   Dg Chest 2 View  Result Date: 09/24/2016 CLINICAL DATA:  New onset of right-sided chest pain last night. Two days of cough. EXAM: CHEST  2 VIEW COMPARISON:  CT scan of the chest of September 20, 2016 and chest x-ray of the same day. FINDINGS: The lungs are well-expanded. There is no pneumothorax or pneumomediastinum. Multiple subcentimeter nodules are again demonstrated bilaterally. The heart and pulmonary vascularity are normal. The mediastinum is normal in width. There is calcification in the wall of the aortic arch. 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 IMPRESSION: There is no pneumothorax, pleural effusion, nor pneumonia. Multiple subcentimeter pulmonary nodules remain. No CHF. Thoracic aortic atherosclerosis. Electronically Signed   By: David  Martinique M.D.   On: 09/24/2016 07:19   Dg Chest 2 View  Result Date: 09/20/2016 CLINICAL DATA:  Shortness of breath, chest pressure, nonproductive cough. EXAM: CHEST  2 VIEW COMPARISON:  CT 10 days prior 09/10/2016 FINDINGS: The cardiomediastinal contours are normal. Calcified granuloma at the right lung base. The pulmonary nodules on recent chest CT are not well delineated  radiographically, 1 of the left lung nodules potentially identified. Pulmonary vasculature is normal. No consolidation, pleural effusion, or pneumothorax. No acute osseous abnormalities are seen. IMPRESSION: No acute abnormality visualized radiographically. Majority of the pulmonary nodules on recent chest CT are not visualized by radiograph. Electronically Signed   By: Jeb Levering M.D.   On: 09/20/2016 20:33   Ct Chest W Contrast  Result Date: 09/10/2016 CLINICAL DATA:  Anemia.  Abnormal colonoscopy. EXAM: CT CHEST, ABDOMEN, AND PELVIS WITH CONTRAST TECHNIQUE: Multidetector CT imaging of the chest, abdomen and pelvis was performed following the standard protocol during bolus administration of intravenous contrast. CONTRAST:  28mL ISOVUE-300 IOPAMIDOL (ISOVUE-300) INJECTION 61% COMPARISON:  None. FINDINGS: CT CHEST FINDINGS Chest wall: No breast masses are identified. No supraclavicular or axillary lymphadenopathy. Small scattered lymph nodes are  noted. The thyroid gland is normal. Cardiovascular: The heart is normal in size. No pericardial effusion. The aorta is normal in caliber. No dissection. The branch vessels are patent. Minimal scattered coronary artery calcifications. The pulmonary arteries appear normal. Mediastinum/Nodes: No mediastinal or hilar mass or adenopathy. The esophagus is normal. Lungs/Pleura: Numerous small bilateral pulmonary nodules consistent with metastatic disease. A few benign calcified granulomas are also noted. No acute pulmonary findings. No pleural effusion. Musculoskeletal: No significant bony findings. No lytic or sclerotic bone lesions to suggest osseous metastatic disease. CT ABDOMEN PELVIS FINDINGS Hepatobiliary: Diffuse hepatic metastatic disease. Index lesion at the hepatic dome on image number 43 measures 3.3 x 3.1 cm. Large conglomerate of masses occupying the right hepatic lobe on image number 54 measures 11.5 x 9.2 cm. Segment 3 lesion on image number 53 measures  2.9 x 2.2 cm. The gallbladder is grossly normal. No common bile duct dilatation. Pancreas: No pancreatic mass, inflammation or ductal dilatation but the pancreas is surrounded by bulky lymphadenopathy. Spleen: Normal size.  No focal lesions. Adrenals/Urinary Tract: The adrenal glands is thickened and there is a right adrenal gland lesion which is likely metastatic disease. Both kidneys are unremarkable.  No renal lesions or hydronephrosis. Stomach/Bowel: The stomach, duodenum, and small bowel are unremarkable. No mass lesions or obstructive findings. The terminal ileum is normal. The appendix is normal. There is a large mass involving the right colon near the hepatic flexure which measures approximately 4.4 x 3.7 cm. There is tumor extending outside the colonic wall and invading the omental fat and also abuts the gallbladder. There is associated bulky and extensive periportal, celiac axis, gastrohepatic ligament and hepatic duodenum ligament lymphadenopathy. There is also bulky retroperitoneal adenopathy. Necrotic appearing large nodal mass surrounding the portal vein and hepatic artery measures 5.4 x 3.3 cm on image number 59. The portal vein is not occluded but significantly compressed. The splenic vein is patent. Index left-sided retroperitoneal lymph node on image number 67 measures 20 x 14 mm. The rectosigmoid lesions seen in sigmoidoscopy is best demonstrated on the coronal reformatted images on image number 99. This measures approximately 2.5 x 2.5 cm. No obstruction. Vascular/Lymphatic: Woke E adenopathy as described above. The aorta and branch vessels are patent. Moderate atherosclerotic calcifications. No aneurysm or dissection. Reproductive: There is a large mixed fatty lesion projecting off of the posterior myometrium most likely the degenerated fibroid with fatty change. I believe I can identify both ovaries as separate from this lesion so I do not think this is an ovarian dermoid. The lesion measures  8.9 x 8.1 cm and there is significant mass effect on the uterus and endometrium. The ovaries appear normal. Other: Small amount of free pelvic fluid. No pelvic lymphadenopathy. No inguinal lymphadenopathy. Small periumbilical abdominal wall hernia. No subcutaneous lesions. Musculoskeletal: No significant bony findings. Advanced degenerative disc disease noted at L3-4. IMPRESSION: 1. Large mass involving the hepatic flexure region of colon with CT evidence of direct invasion of the adjacent omentum and associated bulky abdominal lymphadenopathy, hepatic metastatic disease and pulmonary metastatic disease. 2. A second colonic lesion is noted at the rectosigmoid junction as demonstrated on the colonoscopy. This measures 2.5 x 2.5 cm. 3. Large uterine fibroid with fatty degeneration. 4. Bulky periportal lymphadenopathy is compressing the portal vein with potential risk of portal vein occlusion. Early portal venous collaterals are noted. Electronically Signed   By: Marijo Sanes M.D.   On: 09/10/2016 14:43   Ct Angio Chest Pe W And/or Wo Contrast  Result Date: 09/20/2016 CLINICAL DATA:  Chest pain/pressure, onset today. Recent diagnosis of colon cancer. EXAM: CT ANGIOGRAPHY CHEST WITH CONTRAST TECHNIQUE: Multidetector CT imaging of the chest was performed using the standard protocol during bolus administration of intravenous contrast. Multiplanar CT image reconstructions and MIPs were obtained to evaluate the vascular anatomy. CONTRAST:  Seventy-five cc Isovue 100 IV COMPARISON:  Radiographs earlier this day. Chest CT 10 days prior 09/10/2016 FINDINGS: Cardiovascular: There are no filling defects within the pulmonary arteries to suggest pulmonary embolus. No aortic dissection or aneurysm. Mild aortic atherosclerosis. Heart is normal in size. Scattered coronary artery calcifications. Mediastinum/Nodes: No pericardial effusion. No mediastinal or hilar adenopathy. Visualized thyroid gland is unremarkable. The esophagus  is decompressed. Lungs/Pleura: No evidence of pulmonary edema or focal airspace disease. No pleural fluid. Multiple subcentimeter pulmonary nodules are scattered throughout both lungs, unchanged from recent prior exam. Upper Abdomen: Hepatic metastatic disease again seen. Porta hepatis adenopathy again seen. Upper abdomen better assessed on prior exam due to phase of contrast. Musculoskeletal: No blastic or evidence of lytic osseous lesions. No acute osseous abnormality. Review of the MIP images confirms the above findings. IMPRESSION: 1. No pulmonary embolus. 2. No acute abnormality. Multiple subcentimeter pulmonary nodules consistent with metastatic disease are unchanged from recent prior exam. 3. Mild aortic atherosclerosis.  Coronary artery calcifications. Electronically Signed   By: Jeb Levering M.D.   On: 09/20/2016 22:14   Ct Abdomen Pelvis W Contrast  Result Date: 09/10/2016 CLINICAL DATA:  Anemia.  Abnormal colonoscopy. EXAM: CT CHEST, ABDOMEN, AND PELVIS WITH CONTRAST TECHNIQUE: Multidetector CT imaging of the chest, abdomen and pelvis was performed following the standard protocol during bolus administration of intravenous contrast. CONTRAST:  62mL ISOVUE-300 IOPAMIDOL (ISOVUE-300) INJECTION 61% COMPARISON:  None. FINDINGS: CT CHEST FINDINGS Chest wall: No breast masses are identified. No supraclavicular or axillary lymphadenopathy. Small scattered lymph nodes are noted. The thyroid gland is normal. Cardiovascular: The heart is normal in size. No pericardial effusion. The aorta is normal in caliber. No dissection. The branch vessels are patent. Minimal scattered coronary artery calcifications. The pulmonary arteries appear normal. Mediastinum/Nodes: No mediastinal or hilar mass or adenopathy. The esophagus is normal. Lungs/Pleura: Numerous small bilateral pulmonary nodules consistent with metastatic disease. A few benign calcified granulomas are also noted. No acute pulmonary findings. No pleural  effusion. Musculoskeletal: No significant bony findings. No lytic or sclerotic bone lesions to suggest osseous metastatic disease. CT ABDOMEN PELVIS FINDINGS Hepatobiliary: Diffuse hepatic metastatic disease. Index lesion at the hepatic dome on image number 43 measures 3.3 x 3.1 cm. Large conglomerate of masses occupying the right hepatic lobe on image number 54 measures 11.5 x 9.2 cm. Segment 3 lesion on image number 53 measures 2.9 x 2.2 cm. The gallbladder is grossly normal. No common bile duct dilatation. Pancreas: No pancreatic mass, inflammation or ductal dilatation but the pancreas is surrounded by bulky lymphadenopathy. Spleen: Normal size.  No focal lesions. Adrenals/Urinary Tract: The adrenal glands is thickened and there is a right adrenal gland lesion which is likely metastatic disease. Both kidneys are unremarkable.  No renal lesions or hydronephrosis. Stomach/Bowel: The stomach, duodenum, and small bowel are unremarkable. No mass lesions or obstructive findings. The terminal ileum is normal. The appendix is normal. There is a large mass involving the right colon near the hepatic flexure which measures approximately 4.4 x 3.7 cm. There is tumor extending outside the colonic wall and invading the omental fat and also abuts the gallbladder. There is associated bulky and extensive periportal,  celiac axis, gastrohepatic ligament and hepatic duodenum ligament lymphadenopathy. There is also bulky retroperitoneal adenopathy. Necrotic appearing large nodal mass surrounding the portal vein and hepatic artery measures 5.4 x 3.3 cm on image number 59. The portal vein is not occluded but significantly compressed. The splenic vein is patent. Index left-sided retroperitoneal lymph node on image number 67 measures 20 x 14 mm. The rectosigmoid lesions seen in sigmoidoscopy is best demonstrated on the coronal reformatted images on image number 99. This measures approximately 2.5 x 2.5 cm. No obstruction.  Vascular/Lymphatic: Woke E adenopathy as described above. The aorta and branch vessels are patent. Moderate atherosclerotic calcifications. No aneurysm or dissection. Reproductive: There is a large mixed fatty lesion projecting off of the posterior myometrium most likely the degenerated fibroid with fatty change. I believe I can identify both ovaries as separate from this lesion so I do not think this is an ovarian dermoid. The lesion measures 8.9 x 8.1 cm and there is significant mass effect on the uterus and endometrium. The ovaries appear normal. Other: Small amount of free pelvic fluid. No pelvic lymphadenopathy. No inguinal lymphadenopathy. Small periumbilical abdominal wall hernia. No subcutaneous lesions. Musculoskeletal: No significant bony findings. Advanced degenerative disc disease noted at L3-4. IMPRESSION: 1. Large mass involving the hepatic flexure region of colon with CT evidence of direct invasion of the adjacent omentum and associated bulky abdominal lymphadenopathy, hepatic metastatic disease and pulmonary metastatic disease. 2. A second colonic lesion is noted at the rectosigmoid junction as demonstrated on the colonoscopy. This measures 2.5 x 2.5 cm. 3. Large uterine fibroid with fatty degeneration. 4. Bulky periportal lymphadenopathy is compressing the portal vein with potential risk of portal vein occlusion. Early portal venous collaterals are noted. Electronically Signed   By: Marijo Sanes M.D.   On: 09/10/2016 14:43   Mm Digital Screening Bilateral  Result Date: 08/26/2016 CLINICAL DATA:  Screening. EXAM: DIGITAL SCREENING BILATERAL MAMMOGRAM WITH CAD COMPARISON:  Previous exam(s). ACR Breast Density Category b: There are scattered areas of fibroglandular density. FINDINGS: There are no findings suspicious for malignancy. Images were processed with CAD. IMPRESSION: No mammographic evidence of malignancy. A result letter of this screening mammogram will be mailed directly to the patient.  RECOMMENDATION: Screening mammogram in one year. (Code:SM-B-01Y) BI-RADS CATEGORY  1: Negative. Electronically Signed   By: Lajean Manes M.D.   On: 08/26/2016 11:58   US Abdomen Limited Ruq  Result Date: 09/21/2016 CLINICAL DATA:  Elevated liver enzymes.  History of colon cancer . EXAM: US ABDOMEN LIMITED - RIGHT UPPER QUADRANT COMPARISON:  CT 09/10/2016 . FINDINGS: Gallbladder: Gallbladder is contracted. Gallbladder wall is thickened at 7.3 mm. This may be from contracted state however cholecystitis cannot be excluded. Hyperproteinemia can also present this fashion . No gallstones noted . Common bile duct: Diameter: 4.0 mm Liver: Liver has a heterogeneous echotexture with multiple solid liver lesions again noted. The largest measures 5.0 x 4.3 x 6.5 cm and is in the right hepatic lobe. A 3.8 cm mass is noted within or adjacent to the left hepatic lobe as well. These findings are consistent with metastatic disease. IMPRESSION: 1. No gallstones. Gallbladder wall is thickened. This may be from partially contracted state. This could also be from hypoproteinemia cholecystitis. No biliary distention noted. 2. Multiple hepatic lesions consistent with metastatic disease . A a mass lesion in the periphery of the left hepatic lobe or adjacent to the left hepatic lobe is noted. This also most likely a exophytic metastatic lesion in the  liver versus adjacent adenopathy. Electronically Signed   By: Marcello Moores  Register   On: 09/21/2016 14:06      EKG: Independently reviewed.    Assessment/Plan Active Problems:   Cancer of ascending colon ([facility])   Anemia   Essential hypertension   Metastatic cancer ([place])     Anemia of chronic disease likely secondary to underlying malignancy Will transfuse with 2 units of packed red blood cells today exam most recent anemia panel shows iron deficiency Patient was discharged on ferrous sulfate which will be continued, folate and vitamin B-12 within normal limits     Atypical  Chest pain. Full workup completed during her most recent admission. Patient has ruled out for ACS with negative cardiac markers. Echocardiogram was also unremarkable. Patient also ruled out for pulmonary embolism during her most recent admission   Elevated liver function tests. Possibly related to metastatic disease to liver. Abdominal ultrasound does not show any gallstones or dilated bile duct. Recent CT from 2/15 indicated possible developing portal vein thrombosis. Previously on a statin which have been discontinued. Liver function somewhat better since last admission. However Bilirubin has increased from 3.1> 5.5    Metastatic colon cancer with metastases to liver and lungs. Patient is followed by oncology in [place], Dr. Ammie Dalton. She plans to start systemic chemotherapy.         DVT prophylaxis heparin     Code Status Orders Full code        consults called:   Family Communication: Admission, patients condition and plan of care including tests being ordered have been discussed with the patient  who indicates understanding and agree with the plan and Code Status  Admission status: Observation  Disposition plan: Further plan will depend as patient's clinical course evolves and further radiologic and laboratory data become available. Likely home when stable     [facility] MD [facility] Pager [phone]  If 7PM-7AM, please contact night-coverage [email or web address] Password [email or web address]  09/24/2016, 10:14 AM

## 2016-09-24 NOTE — ED Notes (Signed)
Patient presents with right side pain. Patient recently diagnosed with metastatic cancer to liver and lungs. Denies NVD Denies recent acute illness. Patient presents with jaundice skin color and jaundice to sclera of eyes

## 2016-09-24 NOTE — ED Triage Notes (Signed)
Pt is c/o pain under her right breast that started last night  Pt states she has a dry cough that started yesterday as well  Pt states she has an appt this morning with Dr Learta Codding at the cancer center to have a biopsy but is not sure what they are doing that on  Pt states she was recently diagnosed with colon cancer and has questionable spots on her liver and kidney   Pt denies shortness of breath or nausea

## 2016-09-24 NOTE — ED Provider Notes (Signed)
[facility] DEPT Provider Note   CSN: [id number] Arrival date & time: 09/24/16  0515     History   Chief Complaint Chief Complaint  Patient presents with  . Chest Pain    HPI Lisa Guerra is a 71 y.o. female.  HPI  71 y.o. female with a hx of Anemia, Colon cancer, HTN, presents to the Emergency Department today complaining of right sided chest pain underneath breast. States that it began last night while at rest. No exacerbating factors. Notes pain is consistent. Also noted dry cough that started yesterday as well. No N/V. No diaphoresis. No fevers. No diarrhea. Rates pain 8/10. Pt notes that she has appointment with Oncology (Dr. Ammie Dalton) due to colon cancer today at 11AM. No other symptoms noted.   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 She did not have any acute EKG changes. She ruled out for ACS with negative cardiac markers. Echocardiogram was found to be unremarkable. She did not have any further symptoms since admission. It was noted that she did have elevated liver function tests. Abdominal ultrasound which done which did not show any evidence of gallstones or dilated bile duct. She was seen by gastroenterology who did not feel that she had portal vein thrombosis. It was recommended the patient follow-up with interventional radiology on 3/1 as previously scheduled for liver biopsy at which time liver function tests will be repeated. Her statin has been held for now while transaminases are elevated. Of note, transaminases have trended down since admission, although bilirubin remains elevated which may be due to lag. She does have chronic iron deficiency anemia and is on iron supplementation. She should have repeat CBC and chemistry in 1 week to ensure stability. She'll follow up with oncology in [facility] for  further management of her malignancy.   Past Medical History:  Diagnosis Date  . Anemia   . Cancer ([place])   . Colon cancer ([place])   . High cholesterol   . Hypertension   . Low hemoglobin     Patient Active Problem List   Diagnosis Date Noted  . Elevated LFTs 09/21/2016  . Chest pain 09/20/2016  . Metastatic cancer ([place]) 09/20/2016  . Anemia   . Essential hypertension   . Cancer of ascending colon ([place]) 09/14/2016    Past Surgical History:  Procedure Laterality Date  . CESAREAN SECTION     x2   . COLONOSCOPY N/A 09/07/2016   Dr. Wynetta Emery with [name]: two 5 mm polyps in descending colon, large tumor consistent with adenocarcinoma filled cecum/ascending colon, large multi-lobulated ulcerated polypoid lesion at recto-sigmoid junction 20 cm from anal verge.   . TONSILLECTOMY    . TUBAL LIGATION      OB History    No data available       Home Medications    Prior to Admission medications   Medication Sig Start Date End Date Taking? Authorizing Provider  amLODipine (NORVASC) 5 MG tablet Take 5 mg by mouth daily.    Historical Provider, MD  aspirin EC 81 MG tablet Take 162 mg by mouth every 6 (six) hours as needed for mild pain.    Historical Provider, MD  ferrous sulfate 325 (65 FE) MG tablet Take 1 tablet (325 mg total) by mouth 3 (three) times daily with meals. 09/14/16   Ladell Pier, MD    Family History  Family History  Problem Relation Age of Onset  . Pancreatic cancer Mother   . Leukemia Father   . Colon cancer Brother     Social History Social History  Substance Use Topics  . Smoking status: Never Smoker  . Smokeless tobacco: Never Used  . Alcohol use No     Allergies   Patient has no known allergies.   Review of Systems Review of Systems ROS reviewed and all are negative for acute change except as noted in the HPI.  Physical Exam Updated Vital Signs BP 125/92   Pulse 85   Temp 97.7 F (36.5 C) (Oral)   Resp 16   SpO2 100%   Physical  Exam  Constitutional: She is oriented to person, place, and time. Vital signs are normal. She appears well-developed and well-nourished.  HENT:  Head: Normocephalic and atraumatic.  Right Ear: Hearing normal.  Left Ear: Hearing normal.  Eyes: Conjunctivae and EOM are normal. Pupils are equal, round, and reactive to light.  No scleral icterus  Neck: Normal range of motion. Neck supple.  Cardiovascular: Normal rate, regular rhythm, normal heart sounds and intact distal pulses.   Pulmonary/Chest: Effort normal and breath sounds normal.  Abdominal: Soft. There is no tenderness. There is no rigidity, no rebound, no guarding, no CVA tenderness, no tenderness at McBurney's point and negative Murphy's sign.  Musculoskeletal: Normal range of motion.  Neurological: She is alert and oriented to person, place, and time.  Skin: Skin is warm and dry.  Mild jaundice appearing   Psychiatric: She has a normal mood and affect. Her speech is normal and behavior is normal. Thought content normal.  Nursing note and vitals reviewed.  ED Treatments / Results  Labs (all labs ordered are listed, but only abnormal results are displayed) Labs Reviewed  CBC - Abnormal; Notable for the following:       Result Value   WBC 15.8 (*)    RBC 2.85 (*)    Hemoglobin 6.6 (*)    HCT 21.5 (*)    MCV 75.4 (*)    MCH 23.2 (*)    RDW 19.6 (*)    Platelets 565 (*)    All other components within normal limits  COMPREHENSIVE METABOLIC PANEL - Abnormal; Notable for the following:    CO2 21 (*)    BUN 30 (*)    Creatinine, Ser 1.19 (*)    Calcium 8.7 (*)    Albumin 2.5 (*)    AST 164 (*)    ALT 171 (*)    Alkaline Phosphatase 639 (*)    Total Bilirubin 5.5 (*)    GFR calc non Af Amer 46 (*)    GFR calc Af Amer 53 (*)    All other components within normal limits  I-STAT TROPOININ, ED  TYPE AND SCREEN    EKG  EKG Interpretation  Date/Time:  Thursday September 24 2016 05:28:48 EST Ventricular Rate:  85 PR  Interval:    QRS Duration: 71 QT Interval:  352 QTC Calculation: 419 R Axis:   43 Text Interpretation:  Sinus rhythm Confirmed by [facility]  MD, Emmaline Kluver ([id number]) on 09/24/2016 7:13:05 AM Also confirmed by [facility]  MD, APRIL ([id number]), editor [place] CT, Leda Gauze [phone])  on 09/24/2016 7:46:22 AM      Radiology Dg Chest 2 View  Result Date: 09/24/2016 CLINICAL DATA:  New onset of right-sided chest pain last night. Two days of cough. EXAM: CHEST  2 VIEW COMPARISON:  CT scan of the chest  of September 20, 2016 and chest x-ray of the same day. FINDINGS: The lungs are well-expanded. There is no pneumothorax or pneumomediastinum. Multiple subcentimeter nodules are again demonstrated bilaterally. The heart and pulmonary vascularity are normal. The mediastinum is normal in width. There is calcification in the wall of the aortic arch. 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 IMPRESSION: There is no pneumothorax, pleural effusion, nor pneumonia. Multiple subcentimeter pulmonary nodules remain. No CHF. Thoracic aortic atherosclerosis. Electronically Signed   By: David  Martinique M.D.   On: 09/24/2016 07:19   Procedures Procedures (including critical care time)  CRITICAL CARE Performed by: Ozella Rocks   Total critical care time: 35 minutes  Critical care time was exclusive of separately billable procedures and treating other patients.  Critical care was necessary to treat or prevent imminent or life-threatening deterioration.  Critical care was time spent personally by me on the following activities: development of treatment plan with patient and/or surrogate as well as nursing, discussions with consultants, evaluation of patient's response to treatment, examination of patient, obtaining history from patient or surrogate, ordering and performing treatments and interventions, ordering and review of laboratory studies, ordering and review of radiographic studies, pulse oximetry and  re-evaluation of patient's condition.   Medications Ordered in ED Medications  ibuprofen (ADVIL,MOTRIN) tablet 600 mg (600 mg Oral Given 09/24/16 [id number])   Initial Impression / Assessment and Plan / ED Course  I have reviewed the triage vital signs and the nursing notes.  Pertinent labs & imaging results that were available during my care of the patient were reviewed by me and considered in my medical decision making (see chart for details).  Final Clinical Impressions(s) / ED Diagnoses  {I have reviewed and evaluated the relevant laboratory values. {I have reviewed and evaluated the relevant imaging studies. {I have interpreted the relevant EKG. {I have reviewed the relevant previous healthcare records.  {I obtained HPI from historian. {Patient discussed with supervising physician.  ED Course:  Assessment: Pt is a 71 y.o. female with hx Anemia, Colon cancer, HTN who presents with right sided lower chest pain with onset last night. Occurred at rest. No N/V. No diaphoresis. No worsening factors. On exam, pt in NAD. Nontoxic/nonseptic appearing. VSS. Afebrile. Lungs CTA. Heart RRR. Abdomen nontender soft. Recent visit for same with negative serial Troponins. CT Angio on 09-20-16 negative. Trop negative in ED today. CXR unchanged. EKG unremarkable. Given analgesia in ED. Pt has appointment with Oncologist for liver biopsy at 11AM. Likely pain is related to mets to liver as noted in chart review.   8:28 AM- Hgb 6.6. Per chart review, pt is noted to have chronic iron deficiency anemia and is on iron supplementation for this. Previous Hgb 7.7 on 09-22-16. No melenotic stool. No hematochezia. No hematemesis. Consult placed to Oncology (Dr. Learta Codding). Consult to IR ok for procedure at 1pm. Recommended transfusion prior. Discussed with Dr. Learta Codding. Will admit to medicine for transfusion with procedure done by IR.   Disposition/Plan:  Admit Pt acknowledges and agrees with plan  Supervising Physician Duffy Bruce, MD  Final diagnoses:  Anemia, unspecified type    New Prescriptions New Prescriptions   No medications on file       Shary Decamp, PA-C 09/24/16 [place], MD 09/24/16 [phone]

## 2016-09-24 NOTE — Consult Note (Signed)
Chief Complaint: Patient was seen in consultation today for Port-A-Cath placement and image guided liver lesion biopsy Chief Complaint  Patient presents with  . Chest Pain    Referring Physician(s): Sherrill,B  Supervising Physician: Arne Cleveland  Patient Status: [facility] - In-pt  History of Present Illness: Lisa Guerra is a 71 y.o. female with history of anemia and recently diagnosed colon cancer. Recent imaging has revealed :  1. Large mass involving the hepatic flexure region of colon with CT evidence of direct invasion of the adjacent omentum and associated bulky abdominal lymphadenopathy, hepatic metastatic disease and pulmonary metastatic disease. 2. A second colonic lesion is noted at the rectosigmoid junction as demonstrated on the colonoscopy. This measures 2.5 x 2.5 cm. 3. Large uterine fibroid with fatty degeneration. 4. Bulky periportal lymphadenopathy is compressing the portal vein with potential risk of portal vein occlusion. Early portal venous collaterals are noted  Patient presented to ED this morning with right-sided lower chest/upper abdominal discomfort. Hemoglobin was 6.6. She is receiving blood transfusion at this time. She was scheduled for outpatient Port-A-Cath and liver lesion biopsy in IR today.   Past Medical History:  Diagnosis Date  . Anemia   . Cancer ([place])   . Colon cancer ([place])   . High cholesterol   . Hypertension   . Low hemoglobin     Past Surgical History:  Procedure Laterality Date  . CESAREAN SECTION     x2   . COLONOSCOPY N/A 09/07/2016   Dr. Wynetta Emery with [name]: two 5 mm polyps in descending colon, large tumor consistent with adenocarcinoma filled cecum/ascending colon, large multi-lobulated ulcerated polypoid lesion at recto-sigmoid junction 20 cm from anal verge.   . TONSILLECTOMY    . TUBAL LIGATION      Allergies: Patient has no known allergies.  Medications: Prior to Admission medications   Medication Sig  Start Date End Date Taking? Authorizing Provider  amLODipine (NORVASC) 5 MG tablet Take 5 mg by mouth daily.   Yes Historical Provider, MD  aspirin EC 81 MG tablet Take 162 mg by mouth every 6 (six) hours as needed for mild pain.   Yes Historical Provider, MD  atorvastatin (LIPITOR) 10 MG tablet Take 1 tablet by mouth daily. 08/28/16  Yes Historical Provider, MD  ferrous sulfate 325 (65 FE) MG tablet Take 1 tablet (325 mg total) by mouth 3 (three) times daily with meals. 09/14/16  Yes Ladell Pier, MD  lisinopril-hydrochlorothiazide (PRINZIDE,ZESTORETIC) 20-12.5 MG tablet Take 1 tablet by mouth daily. 08/21/16  Yes Historical Provider, MD     Family History  Problem Relation Age of Onset  . Pancreatic cancer Mother   . Leukemia Father   . Colon cancer Brother     Social History   Social History  . Marital status: Married    Spouse name: N/A  . Number of children: N/A  . Years of education: N/A   Social History Main Topics  . Smoking status: Never Smoker  . Smokeless tobacco: Never Used  . Alcohol use No  . Drug use: No  . Sexual activity: Yes   Other Topics Concern  . None   Social History Narrative  . None     Review of Systems patient currently denies fever, headache, dyspnea, back pain, nausea, vomiting. She has had weight loss, some blood in stools, right lower /lateral chest discomfort, occ right upper quadrant abdominal discomfort, occ cough  Vital Signs: BP 137/83 (BP Location: Right Arm)   Pulse  81   Temp 98 F (36.7 C) (Oral)   Resp 20   SpO2 100%   Physical Exam awake, alert; jaundiced; chest clear  to auscultation bilaterally. Heart with regular rate and rhythm. Abdomen soft, positive bowel sounds, moderately tender right upper quadrant/epigastric region, no significant lower extremity edema  Mallampati Score:     Imaging: Dg Chest 2 View  Result Date: 09/24/2016 CLINICAL DATA:  New onset of right-sided chest pain last night. Two days of cough. EXAM:  CHEST  2 VIEW COMPARISON:  CT scan of the chest of September 20, 2016 and chest x-ray of the same day. FINDINGS: The lungs are well-expanded. There is no pneumothorax or pneumomediastinum. Multiple subcentimeter nodules are again demonstrated bilaterally. The heart and pulmonary vascularity are normal. The mediastinum is normal in width. There is calcification in the wall of the aortic arch. 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 IMPRESSION: There is no pneumothorax, pleural effusion, nor pneumonia. Multiple subcentimeter pulmonary nodules remain. No CHF. Thoracic aortic atherosclerosis. Electronically Signed   By: David  Martinique M.D.   On: 09/24/2016 07:19   Dg Chest 2 View  Result Date: 09/20/2016 CLINICAL DATA:  Shortness of breath, chest pressure, nonproductive cough. EXAM: CHEST  2 VIEW COMPARISON:  CT 10 days prior 09/10/2016 FINDINGS: The cardiomediastinal contours are normal. Calcified granuloma at the right lung base. The pulmonary nodules on recent chest CT are not well delineated radiographically, 1 of the left lung nodules potentially identified. Pulmonary vasculature is normal. No consolidation, pleural effusion, or pneumothorax. No acute osseous abnormalities are seen. IMPRESSION: No acute abnormality visualized radiographically. Majority of the pulmonary nodules on recent chest CT are not visualized by radiograph. Electronically Signed   By: Jeb Levering M.D.   On: 09/20/2016 20:33   Ct Chest W Contrast  Result Date: 09/10/2016 CLINICAL DATA:  Anemia.  Abnormal colonoscopy. EXAM: CT CHEST, ABDOMEN, AND PELVIS WITH CONTRAST TECHNIQUE: Multidetector CT imaging of the chest, abdomen and pelvis was performed following the standard protocol during bolus administration of intravenous contrast. CONTRAST:  25mL ISOVUE-300 IOPAMIDOL (ISOVUE-300) INJECTION 61% COMPARISON:  None. FINDINGS: CT CHEST FINDINGS Chest wall: No breast masses are identified. No supraclavicular or axillary  lymphadenopathy. Small scattered lymph nodes are noted. The thyroid gland is normal. Cardiovascular: The heart is normal in size. No pericardial effusion. The aorta is normal in caliber. No dissection. The branch vessels are patent. Minimal scattered coronary artery calcifications. The pulmonary arteries appear normal. Mediastinum/Nodes: No mediastinal or hilar mass or adenopathy. The esophagus is normal. Lungs/Pleura: Numerous small bilateral pulmonary nodules consistent with metastatic disease. A few benign calcified granulomas are also noted. No acute pulmonary findings. No pleural effusion. Musculoskeletal: No significant bony findings. No lytic or sclerotic bone lesions to suggest osseous metastatic disease. CT ABDOMEN PELVIS FINDINGS Hepatobiliary: Diffuse hepatic metastatic disease. Index lesion at the hepatic dome on image number 43 measures 3.3 x 3.1 cm. Large conglomerate of masses occupying the right hepatic lobe on image number 54 measures 11.5 x 9.2 cm. Segment 3 lesion on image number 53 measures 2.9 x 2.2 cm. The gallbladder is grossly normal. No common bile duct dilatation. Pancreas: No pancreatic mass, inflammation or ductal dilatation but the pancreas is surrounded by bulky lymphadenopathy. Spleen: Normal size.  No focal lesions. Adrenals/Urinary Tract: The adrenal glands is thickened and there is a right adrenal gland lesion which is likely metastatic disease. Both kidneys are unremarkable.  No renal lesions or hydronephrosis. Stomach/Bowel: The stomach, duodenum, and small  bowel are unremarkable. No mass lesions or obstructive findings. The terminal ileum is normal. The appendix is normal. There is a large mass involving the right colon near the hepatic flexure which measures approximately 4.4 x 3.7 cm. There is tumor extending outside the colonic wall and invading the omental fat and also abuts the gallbladder. There is associated bulky and extensive periportal, celiac axis, gastrohepatic  ligament and hepatic duodenum ligament lymphadenopathy. There is also bulky retroperitoneal adenopathy. Necrotic appearing large nodal mass surrounding the portal vein and hepatic artery measures 5.4 x 3.3 cm on image number 59. The portal vein is not occluded but significantly compressed. The splenic vein is patent. Index left-sided retroperitoneal lymph node on image number 67 measures 20 x 14 mm. The rectosigmoid lesions seen in sigmoidoscopy is best demonstrated on the coronal reformatted images on image number 99. This measures approximately 2.5 x 2.5 cm. No obstruction. Vascular/Lymphatic: Woke E adenopathy as described above. The aorta and branch vessels are patent. Moderate atherosclerotic calcifications. No aneurysm or dissection. Reproductive: There is a large mixed fatty lesion projecting off of the posterior myometrium most likely the degenerated fibroid with fatty change. I believe I can identify both ovaries as separate from this lesion so I do not think this is an ovarian dermoid. The lesion measures 8.9 x 8.1 cm and there is significant mass effect on the uterus and endometrium. The ovaries appear normal. Other: Small amount of free pelvic fluid. No pelvic lymphadenopathy. No inguinal lymphadenopathy. Small periumbilical abdominal wall hernia. No subcutaneous lesions. Musculoskeletal: No significant bony findings. Advanced degenerative disc disease noted at L3-4. IMPRESSION: 1. Large mass involving the hepatic flexure region of colon with CT evidence of direct invasion of the adjacent omentum and associated bulky abdominal lymphadenopathy, hepatic metastatic disease and pulmonary metastatic disease. 2. A second colonic lesion is noted at the rectosigmoid junction as demonstrated on the colonoscopy. This measures 2.5 x 2.5 cm. 3. Large uterine fibroid with fatty degeneration. 4. Bulky periportal lymphadenopathy is compressing the portal vein with potential risk of portal vein occlusion. Early portal  venous collaterals are noted. Electronically Signed   By: Marijo Sanes M.D.   On: 09/10/2016 14:43   Ct Angio Chest Pe W And/or Wo Contrast  Result Date: 09/20/2016 CLINICAL DATA:  Chest pain/pressure, onset today. Recent diagnosis of colon cancer. EXAM: CT ANGIOGRAPHY CHEST WITH CONTRAST TECHNIQUE: Multidetector CT imaging of the chest was performed using the standard protocol during bolus administration of intravenous contrast. Multiplanar CT image reconstructions and MIPs were obtained to evaluate the vascular anatomy. CONTRAST:  Seventy-five cc Isovue 100 IV COMPARISON:  Radiographs earlier this day. Chest CT 10 days prior 09/10/2016 FINDINGS: Cardiovascular: There are no filling defects within the pulmonary arteries to suggest pulmonary embolus. No aortic dissection or aneurysm. Mild aortic atherosclerosis. Heart is normal in size. Scattered coronary artery calcifications. Mediastinum/Nodes: No pericardial effusion. No mediastinal or hilar adenopathy. Visualized thyroid gland is unremarkable. The esophagus is decompressed. Lungs/Pleura: No evidence of pulmonary edema or focal airspace disease. No pleural fluid. Multiple subcentimeter pulmonary nodules are scattered throughout both lungs, unchanged from recent prior exam. Upper Abdomen: Hepatic metastatic disease again seen. Porta hepatis adenopathy again seen. Upper abdomen better assessed on prior exam due to phase of contrast. Musculoskeletal: No blastic or evidence of lytic osseous lesions. No acute osseous abnormality. Review of the MIP images confirms the above findings. IMPRESSION: 1. No pulmonary embolus. 2. No acute abnormality. Multiple subcentimeter pulmonary nodules consistent with metastatic disease are unchanged from  recent prior exam. 3. Mild aortic atherosclerosis.  Coronary artery calcifications. Electronically Signed   By: Jeb Levering M.D.   On: 09/20/2016 22:14   Ct Abdomen Pelvis W Contrast  Result Date: 09/10/2016 CLINICAL  DATA:  Anemia.  Abnormal colonoscopy. EXAM: CT CHEST, ABDOMEN, AND PELVIS WITH CONTRAST TECHNIQUE: Multidetector CT imaging of the chest, abdomen and pelvis was performed following the standard protocol during bolus administration of intravenous contrast. CONTRAST:  27mL ISOVUE-300 IOPAMIDOL (ISOVUE-300) INJECTION 61% COMPARISON:  None. FINDINGS: CT CHEST FINDINGS Chest wall: No breast masses are identified. No supraclavicular or axillary lymphadenopathy. Small scattered lymph nodes are noted. The thyroid gland is normal. Cardiovascular: The heart is normal in size. No pericardial effusion. The aorta is normal in caliber. No dissection. The branch vessels are patent. Minimal scattered coronary artery calcifications. The pulmonary arteries appear normal. Mediastinum/Nodes: No mediastinal or hilar mass or adenopathy. The esophagus is normal. Lungs/Pleura: Numerous small bilateral pulmonary nodules consistent with metastatic disease. A few benign calcified granulomas are also noted. No acute pulmonary findings. No pleural effusion. Musculoskeletal: No significant bony findings. No lytic or sclerotic bone lesions to suggest osseous metastatic disease. CT ABDOMEN PELVIS FINDINGS Hepatobiliary: Diffuse hepatic metastatic disease. Index lesion at the hepatic dome on image number 43 measures 3.3 x 3.1 cm. Large conglomerate of masses occupying the right hepatic lobe on image number 54 measures 11.5 x 9.2 cm. Segment 3 lesion on image number 53 measures 2.9 x 2.2 cm. The gallbladder is grossly normal. No common bile duct dilatation. Pancreas: No pancreatic mass, inflammation or ductal dilatation but the pancreas is surrounded by bulky lymphadenopathy. Spleen: Normal size.  No focal lesions. Adrenals/Urinary Tract: The adrenal glands is thickened and there is a right adrenal gland lesion which is likely metastatic disease. Both kidneys are unremarkable.  No renal lesions or hydronephrosis. Stomach/Bowel: The stomach,  duodenum, and small bowel are unremarkable. No mass lesions or obstructive findings. The terminal ileum is normal. The appendix is normal. There is a large mass involving the right colon near the hepatic flexure which measures approximately 4.4 x 3.7 cm. There is tumor extending outside the colonic wall and invading the omental fat and also abuts the gallbladder. There is associated bulky and extensive periportal, celiac axis, gastrohepatic ligament and hepatic duodenum ligament lymphadenopathy. There is also bulky retroperitoneal adenopathy. Necrotic appearing large nodal mass surrounding the portal vein and hepatic artery measures 5.4 x 3.3 cm on image number 59. The portal vein is not occluded but significantly compressed. The splenic vein is patent. Index left-sided retroperitoneal lymph node on image number 67 measures 20 x 14 mm. The rectosigmoid lesions seen in sigmoidoscopy is best demonstrated on the coronal reformatted images on image number 99. This measures approximately 2.5 x 2.5 cm. No obstruction. Vascular/Lymphatic: Woke E adenopathy as described above. The aorta and branch vessels are patent. Moderate atherosclerotic calcifications. No aneurysm or dissection. Reproductive: There is a large mixed fatty lesion projecting off of the posterior myometrium most likely the degenerated fibroid with fatty change. I believe I can identify both ovaries as separate from this lesion so I do not think this is an ovarian dermoid. The lesion measures 8.9 x 8.1 cm and there is significant mass effect on the uterus and endometrium. The ovaries appear normal. Other: Small amount of free pelvic fluid. No pelvic lymphadenopathy. No inguinal lymphadenopathy. Small periumbilical abdominal wall hernia. No subcutaneous lesions. Musculoskeletal: No significant bony findings. Advanced degenerative disc disease noted at L3-4. IMPRESSION: 1. Large mass  involving the hepatic flexure region of colon with CT evidence of direct  invasion of the adjacent omentum and associated bulky abdominal lymphadenopathy, hepatic metastatic disease and pulmonary metastatic disease. 2. A second colonic lesion is noted at the rectosigmoid junction as demonstrated on the colonoscopy. This measures 2.5 x 2.5 cm. 3. Large uterine fibroid with fatty degeneration. 4. Bulky periportal lymphadenopathy is compressing the portal vein with potential risk of portal vein occlusion. Early portal venous collaterals are noted. Electronically Signed   By: Marijo Sanes M.D.   On: 09/10/2016 14:43   Mm Digital Screening Bilateral  Result Date: 08/26/2016 CLINICAL DATA:  Screening. EXAM: DIGITAL SCREENING BILATERAL MAMMOGRAM WITH CAD COMPARISON:  Previous exam(s). ACR Breast Density Category b: There are scattered areas of fibroglandular density. FINDINGS: There are no findings suspicious for malignancy. Images were processed with CAD. IMPRESSION: No mammographic evidence of malignancy. A result letter of this screening mammogram will be mailed directly to the patient. RECOMMENDATION: Screening mammogram in one year. (Code:SM-B-01Y) BI-RADS CATEGORY  1: Negative. Electronically Signed   By: Lajean Manes M.D.   On: 08/26/2016 11:58   US Abdomen Limited Ruq  Result Date: 09/21/2016 CLINICAL DATA:  Elevated liver enzymes.  History of colon cancer . EXAM: US ABDOMEN LIMITED - RIGHT UPPER QUADRANT COMPARISON:  CT 09/10/2016 . FINDINGS: Gallbladder: Gallbladder is contracted. Gallbladder wall is thickened at 7.3 mm. This may be from contracted state however cholecystitis cannot be excluded. Hyperproteinemia can also present this fashion . No gallstones noted . Common bile duct: Diameter: 4.0 mm Liver: Liver has a heterogeneous echotexture with multiple solid liver lesions again noted. The largest measures 5.0 x 4.3 x 6.5 cm and is in the right hepatic lobe. A 3.8 cm mass is noted within or adjacent to the left hepatic lobe as well. These findings are consistent with  metastatic disease. IMPRESSION: 1. No gallstones. Gallbladder wall is thickened. This may be from partially contracted state. This could also be from hypoproteinemia cholecystitis. No biliary distention noted. 2. Multiple hepatic lesions consistent with metastatic disease . A a mass lesion in the periphery of the left hepatic lobe or adjacent to the left hepatic lobe is noted. This also most likely a exophytic metastatic lesion in the liver versus adjacent adenopathy. Electronically Signed   By: Marcello Moores  Register   On: 09/21/2016 14:06    Labs:  CBC:  Recent Labs  09/14/16 1047 09/20/16 1945 09/22/16 0555 09/24/16 0705  WBC 11.3* 14.4* 15.8* 15.8*  HGB 7.3* 7.9* 7.7* 6.6*  HCT 23.5* 25.6* 25.3* 21.5*  PLT 597* 621* 646* 565*    COAGS:  Recent Labs  09/14/16 1047 09/24/16 1007  INR 1.20* 1.06    BMP:  Recent Labs  09/07/16 1100 09/14/16 1047 09/20/16 1945 09/22/16 0555 09/24/16 0705  NA 137 139 141 135 136  K 4.7 4.6 3.8 4.0 4.1  CL 105  --  108 105 105  CO2 24 22 23 23  21*  GLUCOSE 85 87 152* 105* 88  BUN 33* 21.4 29* 26* 30*  CALCIUM 8.5* 9.5 9.2 8.6* 8.7*  CREATININE 1.65* 1.3* 1.50* 1.28* 1.19*  GFRNONAA 31*  --  35* 42* 46*  GFRAA 36*  --  40* 49* 53*    LIVER FUNCTION TESTS:  Recent Labs  09/14/16 1047 09/20/16 1945 09/22/16 0555 09/24/16 0705  BILITOT 0.35 1.7* 3.1* 5.5*  AST 31 356* 249* 164*  ALT 24 257* 242* 171*  ALKPHOS 219* 573* 582* 639*  PROT 7.3 7.6  7.1 6.8  ALBUMIN 2.8* 2.9* 2.6* 2.5*    TUMOR MARKERS:  Recent Labs  09/07/16 1100  CEA 1,142.0*    Assessment and Plan: Patient with history of anemia and recently diagnosed colon cancer with evidence of metastatic disease on latest imaging studies. Hemoglobin today in ED was 6.6. She is currently receiving blood transfusion. She was scheduled for outpatient Port-A-Cath placement and  liver lesion biopsy in IR today. Liver biopsy specimen will be submitted for Foundation 1 testing to  establish the RAS mutation status and look for evidence of  HNPCC. Following discussion with Drs. [place] we will continue plan for above procedures later this afternoon following completion of blood transfusion. Details/risks of procedures, including but not limited to, internal bleeding, infection, injury to adjacent structures, discussed with patient with her understanding and consent.  Thank you for this interesting consult.  I greatly enjoyed meeting Mulan M Strickler and look forward to participating in their care.  A copy of this report was sent to the requesting provider on this date.  Electronically Signed: D. Rowe Robert 09/24/2016, 12:42 PM   I spent a total of 40 minutes    in face to face in clinical consultation, greater than 50% of which was counseling/coordinating care for Port-A-Cath placement and image guided liver lesion biopsy

## 2016-09-24 NOTE — ED Notes (Signed)
No heparin injections

## 2016-09-24 NOTE — Progress Notes (Signed)
MEDICATION-RELATED CONSULT NOTE   IR Procedure Consult - Anticoagulant/Antiplatelet PTA/Inpatient Med List Review by Pharmacist    Procedure: 1. R IJ Port cathter placement with [place] and fluoroscopy 2.  US liver lesion 18g core bx    Completed: 1515  Post-Procedural bleeding risk per IR MD assessment:  standard  Antithrombotic medications on inpatient or PTA profile prior to procedure:    Heparin 5000 Units SQ q8h   Recommended restart time per IR Post-Procedure Guidelines:   At least 6 hours or at next standard dose interval  Other considerations:      Plan:     MD already reordered Heparin SQ starting at appropriate time (2200)   Dolly Rias RPh 09/24/2016, 4:16 PM Pager [phone]

## 2016-09-24 NOTE — ED Notes (Signed)
rn will collect add on labs at time of IV start

## 2016-09-24 NOTE — Procedures (Signed)
1. R IJ Port cathter placement with [place] and fluoroscopy 2.  US liver lesion 18g core bx  No complication No blood loss. See complete dictation in [facility].

## 2016-09-25 ENCOUNTER — Observation Stay (HOSPITAL_COMMUNITY): Payer: Medicare Other

## 2016-09-25 ENCOUNTER — Ambulatory Visit: Payer: Medicare Other | Admitting: Oncology

## 2016-09-25 ENCOUNTER — Encounter (HOSPITAL_COMMUNITY): Payer: Self-pay | Admitting: Interventional Radiology

## 2016-09-25 DIAGNOSIS — D638 Anemia in other chronic diseases classified elsewhere: Secondary | ICD-10-CM

## 2016-09-25 DIAGNOSIS — E43 Unspecified severe protein-calorie malnutrition: Secondary | ICD-10-CM | POA: Insufficient documentation

## 2016-09-25 DIAGNOSIS — C799 Secondary malignant neoplasm of unspecified site: Secondary | ICD-10-CM

## 2016-09-25 LAB — BPAM RBC
Blood Product Expiration Date: 201803282359
Blood Product Expiration Date: 201803282359
ISSUE DATE / TIME: 201803011539
ISSUE DATE / TIME: 201803011057
UNIT TYPE AND RH: 5100
Unit Type and Rh: 5100

## 2016-09-25 LAB — COMPREHENSIVE METABOLIC PANEL
ALK PHOS: 678 U/L — AB (ref 38–126)
ALT: 112 U/L — ABNORMAL HIGH (ref 14–54)
ANION GAP: 8 (ref 5–15)
AST: 148 U/L — ABNORMAL HIGH (ref 15–41)
Albumin: 2.3 g/dL — ABNORMAL LOW (ref 3.5–5.0)
BILIRUBIN TOTAL: 6.9 mg/dL — AB (ref 0.3–1.2)
BUN: 26 mg/dL — ABNORMAL HIGH (ref 6–20)
CALCIUM: 8.5 mg/dL — AB (ref 8.9–10.3)
CO2: 22 mmol/L (ref 22–32)
Chloride: 107 mmol/L (ref 101–111)
Creatinine, Ser: 1.14 mg/dL — ABNORMAL HIGH (ref 0.44–1.00)
GFR, EST AFRICAN AMERICAN: 56 mL/min — AB (ref >60)
GFR, EST NON AFRICAN AMERICAN: 48 mL/min — AB (ref >60)
Glucose, Bld: 101 mg/dL — ABNORMAL HIGH (ref 65–99)
Potassium: 4.1 mmol/L (ref 3.5–5.1)
SODIUM: 137 mmol/L (ref 135–145)
TOTAL PROTEIN: 6.7 g/dL (ref 6.5–8.1)

## 2016-09-25 LAB — TYPE AND SCREEN
ABO/RH(D): O POS
ANTIBODY SCREEN: NEGATIVE
UNIT DIVISION: 0
Unit division: 0

## 2016-09-25 LAB — CBC
HCT: 29.8 % — ABNORMAL LOW (ref 36.0–46.0)
Hemoglobin: 9.6 g/dL — ABNORMAL LOW (ref 12.0–15.0)
MCH: 24.1 pg — ABNORMAL LOW (ref 26.0–34.0)
MCHC: 32.2 g/dL (ref 30.0–36.0)
MCV: 74.7 fL — ABNORMAL LOW (ref 78.0–100.0)
PLATELETS: 477 10*3/uL — AB (ref 150–400)
RBC: 3.99 MIL/uL (ref 3.87–5.11)
RDW: 18.4 % — ABNORMAL HIGH (ref 11.5–15.5)
WBC: 17.5 10*3/uL — AB (ref 4.0–10.5)

## 2016-09-25 LAB — AMMONIA: AMMONIA: 30 umol/L (ref 9–35)

## 2016-09-25 MED ORDER — GADOBENATE DIMEGLUMINE 529 MG/ML IV SOLN
10.0000 mL | Freq: Once | INTRAVENOUS | Status: AC | PRN
  Administered 2016-09-25: 9 mL via INTRAVENOUS

## 2016-09-25 MED ORDER — ENSURE ENLIVE PO LIQD
237.0000 mL | Freq: Two times a day (BID) | ORAL | Status: DC
  Administered 2016-09-28 – 2016-09-30 (×6): 237 mL via ORAL

## 2016-09-25 NOTE — Progress Notes (Signed)
PROGRESS NOTE    AMYE KUPFERMAN  [id number] DOB: [birth year] DOA: 09/24/2016 PCP: [name], MD   Brief Narrative: 71 y.o.year old female with a history of anemia, hypertension, who   recently was diagnosed with metastatic colon cancer after colonoscopy Sep 07, 2016 by [facility], revealing a large mass at the cecum (adenocarcinoma)  . Staging with CT chest/abd/pelvis revealed numerous small pulmonary nodules consistent with metastases, extensive liver metastases.She was seen by Dr. Benay Spice with Oncology on 2/19 and felt to have synchronous right and left colon primaries. Liver biopsy arranged for 3/1 with plans for systemic therapy. She presented with complaints of pain under her right breast that started  several days ago associated with a dry cough. She denied shortness of breath. Patient was also scheduled for an ultrasound-guided biopsy today which was canceled because of anemia. Patient was asked to first get transfused, and reschedule biopsy for later. She denies any hematochezia or melena. Patient is being admitted for transfusion, prior to procedure by IR She was recently hospitalized at [facility] and discharged on 2/27, for chest pain. CT of the chest on 09/20/16 did not show any pulmonary embolus, multiple pulmonary nodules consistent with metastatic disease. Cardiac enzymes were found to be negative during that admission, EKG was unremarkable. Hemoglobin was 7.7 on 2/27 prior to discharge.    Assessment & Plan:   Active Problems:   Cancer of ascending colon ([place])   Anemia   Essential hypertension   Metastatic cancer ([place])   Anemia of chronic disease   Protein-calorie malnutrition, severe  1-Anemia of chronic disease likely secondary to underlying malignancy Received 2 units of PRBC.  Hb increased to 9.   2-Transaminases, hyperbilirubinemia; progressively worse LFT. Concern for obstruction. Discussed with [facility] will get MRCP . Discussed with Dr Benay Spice if this is  progression of malignancy patient will required tx soon.   3-Large mass involving hepatic flexure;  Underwent Biopsy today.   4-Metastatic colon cancer with metastases to liver and lungs. Patient is followed by oncology in [place], Dr. Ammie Dalton. Oncology will see patient in consultation.  5-chest pain; suspect related to low hb . Resolved.    DVT prophylaxis: scd.  Code Status: full code.  Family Communication: none at bedside.  Disposition Plan: patient with worsening liver function and bilirubin level. Patient need MRCP if she has duct obstruction she will required Stent placement to relieved obstruction. If her LFT is worse due to progression of malignancy she will required treatment for the same. Dr Marin Olp will see patient in consultation. Discussed case with GI/   Consultants:   Oncology   GI    Procedures: liver biopsy 3-2   Antimicrobials: none   Subjective: Denies RUQ abdominal pain.  Not eating a lot. Poor oral intake.    Objective: Vitals:   09/24/16 1830 09/24/16 2021 09/25/16 0557 09/25/16 1433  BP: 124/82 129/84 (!) 145/93 (!) 139/95  Pulse: 83 84 81 87  Resp: 18 18 18 18   Temp: 98.2 F (36.8 C) 97.8 F (36.6 C) 98.2 F (36.8 C) 97.7 F (36.5 C)  TempSrc: Oral Oral Oral Oral  SpO2: 100% 100% 100% 100%  Weight:      Height:        Intake/Output Summary (Last 24 hours) at 09/25/16 1439 Last data filed at 09/25/16 0520  Gross per 24 hour  Intake             1241 ml  Output  0 ml  Net             1241 ml   Filed Weights   09/24/16 1300  Weight: 46.4 kg (102 lb 6.4 oz)    Examination:  General exam: Appears calm and comfortable, thin appearing, icteric  Respiratory system: Clear to auscultation. Respiratory effort normal. Cardiovascular system: S1 & S2 heard, RRR. No JVD, murmurs, rubs, gallops or clicks. No pedal edema. Gastrointestinal system: Abdomen is nondistended, soft and mild tender. Normal bowel sounds  heard. Central nervous system: Alert and oriented. No focal neurological deficits. Extremities: Symmetric 5 x 5 power. Skin: No rashes, lesions or ulcers    Data Reviewed: I have personally reviewed following labs and imaging studies  CBC:  Recent Labs Lab 09/20/16 1945 09/22/16 0555 09/24/16 0705 09/25/16 0518  WBC 14.4* 15.8* 15.8* 17.5*  HGB 7.9* 7.7* 6.6* 9.6*  HCT 25.6* 25.3* 21.5* 29.8*  MCV 77.3* 77.8* 75.4* 74.7*  PLT 621* 646* 565* [id number]*   Basic Metabolic Panel:  Recent Labs Lab 09/20/16 1945 09/22/16 0555 09/24/16 0705 09/24/16 1007 09/25/16 0518  NA 141 135 136  --  137  K 3.8 4.0 4.1  --  4.1  CL 108 105 105  --  107  CO2 23 23 21*  --  22  GLUCOSE 152* 105* 88  --  101*  BUN 29* 26* 30*  --  26*  CREATININE 1.50* 1.28* 1.19*  --  1.14*  CALCIUM 9.2 8.6* 8.7*  --  8.5*  MG  --   --   --  2.0  --    GFR: Estimated Creatinine Clearance: 34.6 mL/min (by C-G formula based on SCr of 1.14 mg/dL (H)). Liver Function Tests:  Recent Labs Lab 09/20/16 1945 09/22/16 0555 09/24/16 0705 09/25/16 0518  AST 356* 249* 164* 148*  ALT 257* 242* 171* 112*  ALKPHOS 573* 582* 639* 678*  BILITOT 1.7* 3.1* 5.5* 6.9*  PROT 7.6 7.1 6.8 6.7  ALBUMIN 2.9* 2.6* 2.5* 2.3*    Recent Labs Lab 09/21/16 1225  LIPASE 23   No results for input(s): AMMONIA in the last 168 hours. Coagulation Profile:  Recent Labs Lab 09/24/16 1007  INR 1.06   Cardiac Enzymes:  Recent Labs Lab 09/20/16 1945 09/21/16 1225 09/21/16 1915 09/22/16 0017  TROPONINI <0.03 <0.03 <0.03 <0.03   BNP (last 3 results) No results for input(s): PROBNP in the last 8760 hours. HbA1C: No results for input(s): HGBA1C in the last 72 hours. CBG: No results for input(s): GLUCAP in the last 168 hours. Lipid Profile: No results for input(s): CHOL, HDL, LDLCALC, TRIG, CHOLHDL, LDLDIRECT in the last 72 hours. Thyroid Function Tests:  Recent Labs  09/24/16 1007  TSH 2.084   Anemia  Panel: No results for input(s): VITAMINB12, FOLATE, FERRITIN, TIBC, IRON, RETICCTPCT in the last 72 hours. Sepsis Labs: No results for input(s): PROCALCITON, LATICACIDVEN in the last 168 hours.  No results found for this or any previous visit (from the past 240 hour(s)).       Radiology Studies: Dg Chest 2 View  Result Date: 09/24/2016 CLINICAL DATA:  New onset of right-sided chest pain last night. Two days of cough. EXAM: CHEST  2 VIEW COMPARISON:  CT scan of the chest of September 20, 2016 and chest x-ray of the same day. FINDINGS: The lungs are well-expanded. There is no pneumothorax or pneumomediastinum. Multiple subcentimeter nodules are again demonstrated bilaterally. The heart and pulmonary vascularity are normal. The mediastinum is normal in width. There  is calcification in the wall of the aortic arch. 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 IMPRESSION: There is no pneumothorax, pleural effusion, nor pneumonia. Multiple subcentimeter pulmonary nodules remain. No CHF. Thoracic aortic atherosclerosis. Electronically Signed   By: David  Martinique M.D.   On: 09/24/2016 07:19   Ir US Guide Vasc Access Right  Result Date: 09/25/2016 CLINICAL DATA:  Colon carcinoma. Liver lesions. Needs durable venous access for chemotherapy regimen. EXAM: TUNNELED PORT CATHETER PLACEMENT WITH ULTRASOUND AND FLUOROSCOPIC GUIDANCE FLUOROSCOPY TIME:  0.2 minutes (829 uGym2 DAP) ANESTHESIA/SEDATION: Intravenous Fentanyl and Versed were administered as conscious sedation during continuous monitoring of the patient's level of consciousness and physiological / cardiorespiratory status by the radiology RN, with a total moderate sedation time of 22 minutes. TECHNIQUE: The procedure, risks, benefits, and alternatives were explained to the patient. Questions regarding the procedure were encouraged and answered. The patient understands and consents to the procedure. As antibiotic prophylaxis, cefazolin 2 g was  ordered pre-procedure and administered intravenously within one hour of incision. Patency of the right IJ vein was confirmed with ultrasound with image documentation. An appropriate skin site was determined. Skin site was marked. Region was prepped using maximum barrier technique including cap and mask, sterile gown, sterile gloves, large sterile sheet, and Chlorhexidine as cutaneous antisepsis. The region was infiltrated locally with 1% lidocaine. Under real-time ultrasound guidance, the right IJ vein was accessed with a 21 gauge micropuncture needle; the needle tip within the vein was confirmed with ultrasound image documentation. Needle was exchanged over a 018 guidewire for transitional dilator which allowed passage of the [facility] wire into the IVC. Over this, the transitional dilator was exchanged for a 5 [place] MPA catheter. A small incision was made on the right anterior chest wall and a subcutaneous pocket fashioned. The power-injectable port was positioned and its catheter tunneled to the right IJ dermatotomy site. The MPA catheter was exchanged over an Amplatz wire for a peel-away sheath, through which the port catheter, which had been trimmed to the appropriate length, was advanced and positioned under fluoroscopy with its tip at the cavoatrial junction. Spot chest radiograph confirms good catheter position and no pneumothorax. The pocket was closed with deep interrupted and subcuticular continuous 3-0 Monocryl sutures. The port was flushed per protocol. The incisions were covered with Dermabond then covered with a sterile dressing. COMPLICATIONS: COMPLICATIONS None immediate IMPRESSION: Technically successful right IJ power-injectable port catheter placement. Ready for routine use. Electronically Signed   By: Lucrezia Europe M.D.   On: 09/25/2016 08:22   Ir US Guide Bx Asp/drain  Result Date: 09/25/2016 CLINICAL DATA:  Colon carcinoma.  Multiple liver lesions. EXAM: ULTRASOUND GUIDED CORE BIOPSY OF LIVER  LESION MEDICATIONS: Intravenous Fentanyl and Versed were administered as conscious sedation during continuous monitoring of the patient's level of consciousness and physiological / cardiorespiratory status by the radiology RN, with a total moderate sedation time of 7 minutes. PROCEDURE: The procedure, risks, benefits, and alternatives were explained to the patient. Questions regarding the procedure were encouraged and answered. The patient understands and consents to the procedure. Survey ultrasound of the liver performed. An appropriate representative right lobe lesion was localized and skin entry site determined and marked. The operative field was prepped with chlorhexidine in a sterile fashion, and a sterile drape was applied covering the operative field. A sterile gown and sterile gloves were used for the procedure. Local anesthesia was provided with 1% Lidocaine. Under real-time ultrasound guidance, a 17 gauge trocar needle was advanced  to the margin of the lesion. Once needle tip position was confirmed, coaxial 18-gauge core biopsy samples were obtained, submitted in formalin to surgical pathology. The guide needle was removed. Postprocedure scans show no hemorrhage or other apparent complication. The patient tolerated the procedure well. COMPLICATIONS: None. FINDINGS: Multiple echogenic liver lesions were localized corresponding to findings on recent CT. Representative core biopsy samples were obtained as above. IMPRESSION: 1. Technically successful ultrasound-guided core biopsy, liver lesion. Electronically Signed   By: Lucrezia Europe M.D.   On: 09/25/2016 08:24   Ir Fluoro Guide Port Insertion Right  Result Date: 09/25/2016 CLINICAL DATA:  Colon carcinoma. Liver lesions. Needs durable venous access for chemotherapy regimen. EXAM: TUNNELED PORT CATHETER PLACEMENT WITH ULTRASOUND AND FLUOROSCOPIC GUIDANCE FLUOROSCOPY TIME:  0.2 minutes (829 uGym2 DAP) ANESTHESIA/SEDATION: Intravenous Fentanyl and Versed were  administered as conscious sedation during continuous monitoring of the patient's level of consciousness and physiological / cardiorespiratory status by the radiology RN, with a total moderate sedation time of 22 minutes. TECHNIQUE: The procedure, risks, benefits, and alternatives were explained to the patient. Questions regarding the procedure were encouraged and answered. The patient understands and consents to the procedure. As antibiotic prophylaxis, cefazolin 2 g was ordered pre-procedure and administered intravenously within one hour of incision. Patency of the right IJ vein was confirmed with ultrasound with image documentation. An appropriate skin site was determined. Skin site was marked. Region was prepped using maximum barrier technique including cap and mask, sterile gown, sterile gloves, large sterile sheet, and Chlorhexidine as cutaneous antisepsis. The region was infiltrated locally with 1% lidocaine. Under real-time ultrasound guidance, the right IJ vein was accessed with a 21 gauge micropuncture needle; the needle tip within the vein was confirmed with ultrasound image documentation. Needle was exchanged over a 018 guidewire for transitional dilator which allowed passage of the [facility] wire into the IVC. Over this, the transitional dilator was exchanged for a 5 [place] MPA catheter. A small incision was made on the right anterior chest wall and a subcutaneous pocket fashioned. The power-injectable port was positioned and its catheter tunneled to the right IJ dermatotomy site. The MPA catheter was exchanged over an Amplatz wire for a peel-away sheath, through which the port catheter, which had been trimmed to the appropriate length, was advanced and positioned under fluoroscopy with its tip at the cavoatrial junction. Spot chest radiograph confirms good catheter position and no pneumothorax. The pocket was closed with deep interrupted and subcuticular continuous 3-0 Monocryl sutures. The port was  flushed per protocol. The incisions were covered with Dermabond then covered with a sterile dressing. COMPLICATIONS: COMPLICATIONS None immediate IMPRESSION: Technically successful right IJ power-injectable port catheter placement. Ready for routine use. Electronically Signed   By: Lucrezia Europe M.D.   On: 09/25/2016 08:22        Scheduled Meds: . amLODipine  5 mg Oral Daily  . ferrous sulfate  325 mg Oral TID WC  . sodium chloride flush  3 mL Intravenous Q12H   Continuous Infusions: . sodium chloride 50 mL/hr at 09/24/16 1854     LOS: 0 days    Time spent: 35 minutes.     Elmarie Shiley, MD [facility] Pager [phone]  If 7PM-7AM, please contact night-coverage [email or web address] Password [facility] 09/25/2016, 2:39 PM

## 2016-09-25 NOTE — Care Management Obs Status (Signed)
[place] NOTIFICATION   Patient Details  Name: RUBI OOTEN MRN: [id number] Date of Birth: [birth year]   Medicare Observation Status Notification Given:  Yes    Lynnell Catalan, RN 09/25/2016, 3:56 PM

## 2016-09-25 NOTE — Progress Notes (Signed)
Initial Nutrition Assessment  DOCUMENTATION CODES:   Severe malnutrition in context of chronic illness  INTERVENTION:  Magic cup TID with meals, each supplement provides 290 kcal and 9 grams of protein  Snacks- Mighty shake and peanut butter crackers  NUTRITION DIAGNOSIS:   Malnutrition related to cancer and cancer related treatments as evidenced by 26 percent weight loss in one year, severe depletion of muscle mass, severe depletion of body fat.  GOAL:   Patient will meet greater than or equal to 90% of their needs  MONITOR:   PO intake, Supplement acceptance, Labs, Weight trends  REASON FOR ASSESSMENT:   Malnutrition Screening Tool    ASSESSMENT:   71 y.o.year old female with a history of anemia, hypertension, who   recently was diagnosed with metastatic colon cancer after colonoscopy Sep 07, 2016 by [facility], revealing a large mass at the cecum (adenocarcinoma)  . Staging with CT chest/abd/pelvis revealed numerous small pulmonary nodules consistent with metastases, extensive liver metastases.She was seen by Dr. Benay Spice with Oncology on 2/19 and felt to have synchronous right and left colon primaries. Liver biopsy arranged for 3/1 with plans for systemic therapy. She presented with complaints of pain under her right breast that started several days ago associated with a dry cough.   Met with pt in room today. Pt reports poor appetite and oral intake for 2 months pta. Pt reports that she weighed 138lbs around a year ago; this would be a 36lb(26%) wt loss in 1 year. Per chart, pt has lost 18lbs(15%) in two weeks. This is significant. Pt is currently eating 25% meals. Pt refuses to try supplements but is willing to try Magic Cups and have snacks. RD discussed with pt the importance of adequate protein intake with cancer.   Medications reviewed and include: ferrous sulfate, heparin, oxycodone  Labs reviewed: BUN 26(H), creat 1.14(H), Ca 8.5(L) adj. 9.86 wnl, alb 2.3(L), AlkPhos  678(H), AST 148(H), ALT 112(H), tbili 6.9(H) Wbc- 17.5(H), Hgb 9.6(L), Hct 29.8(L)  Nutrition-Focused physical exam completed. Findings are severe fat depletion, severe muscle depletion, and no edema.   Diet Order:  Diet regular Room service appropriate? Yes; Fluid consistency: Thin  Skin:  Reviewed, no issues  Last BM:  2/27  Height:   Ht Readings from Last 1 Encounters:  09/24/16 5' 6"  (1.676 m)    Weight:   Wt Readings from Last 1 Encounters:  09/24/16 102 lb 6.4 oz (46.4 kg)    Ideal Body Weight:  59 kg  BMI:  Body mass index is 16.53 kg/m.  Estimated Nutritional Needs:   Kcal:  1400-1700kcal/day   Protein:  69-79g/day   Fluid:  >1.4L/day   EDUCATION NEEDS:   No education needs identified at this time  Koleen Distance, RD, LDN Pager #[phone]

## 2016-09-26 DIAGNOSIS — Z79899 Other long term (current) drug therapy: Secondary | ICD-10-CM | POA: Diagnosis not present

## 2016-09-26 DIAGNOSIS — C787 Secondary malignant neoplasm of liver and intrahepatic bile duct: Secondary | ICD-10-CM | POA: Diagnosis present

## 2016-09-26 DIAGNOSIS — D500 Iron deficiency anemia secondary to blood loss (chronic): Secondary | ICD-10-CM | POA: Diagnosis present

## 2016-09-26 DIAGNOSIS — E43 Unspecified severe protein-calorie malnutrition: Secondary | ICD-10-CM | POA: Diagnosis not present

## 2016-09-26 DIAGNOSIS — D509 Iron deficiency anemia, unspecified: Secondary | ICD-10-CM | POA: Diagnosis not present

## 2016-09-26 DIAGNOSIS — C7989 Secondary malignant neoplasm of other specified sites: Secondary | ICD-10-CM | POA: Diagnosis not present

## 2016-09-26 DIAGNOSIS — D649 Anemia, unspecified: Secondary | ICD-10-CM

## 2016-09-26 DIAGNOSIS — D638 Anemia in other chronic diseases classified elsewhere: Secondary | ICD-10-CM | POA: Diagnosis not present

## 2016-09-26 DIAGNOSIS — C180 Malignant neoplasm of cecum: Secondary | ICD-10-CM | POA: Diagnosis not present

## 2016-09-26 DIAGNOSIS — D630 Anemia in neoplastic disease: Secondary | ICD-10-CM | POA: Diagnosis present

## 2016-09-26 DIAGNOSIS — Z681 Body mass index (BMI) 19 or less, adult: Secondary | ICD-10-CM | POA: Diagnosis not present

## 2016-09-26 DIAGNOSIS — R0789 Other chest pain: Secondary | ICD-10-CM | POA: Diagnosis present

## 2016-09-26 DIAGNOSIS — E7800 Pure hypercholesterolemia, unspecified: Secondary | ICD-10-CM | POA: Diagnosis present

## 2016-09-26 DIAGNOSIS — C779 Secondary and unspecified malignant neoplasm of lymph node, unspecified: Secondary | ICD-10-CM | POA: Diagnosis not present

## 2016-09-26 DIAGNOSIS — R6881 Early satiety: Secondary | ICD-10-CM | POA: Diagnosis present

## 2016-09-26 DIAGNOSIS — N189 Chronic kidney disease, unspecified: Secondary | ICD-10-CM | POA: Diagnosis not present

## 2016-09-26 DIAGNOSIS — Z800 Family history of malignant neoplasm of digestive organs: Secondary | ICD-10-CM | POA: Diagnosis not present

## 2016-09-26 DIAGNOSIS — K838 Other specified diseases of biliary tract: Secondary | ICD-10-CM | POA: Diagnosis present

## 2016-09-26 DIAGNOSIS — C189 Malignant neoplasm of colon, unspecified: Secondary | ICD-10-CM | POA: Diagnosis present

## 2016-09-26 DIAGNOSIS — K831 Obstruction of bile duct: Secondary | ICD-10-CM | POA: Diagnosis not present

## 2016-09-26 DIAGNOSIS — C7971 Secondary malignant neoplasm of right adrenal gland: Secondary | ICD-10-CM | POA: Diagnosis present

## 2016-09-26 DIAGNOSIS — R627 Adult failure to thrive: Secondary | ICD-10-CM | POA: Diagnosis present

## 2016-09-26 DIAGNOSIS — Z7982 Long term (current) use of aspirin: Secondary | ICD-10-CM | POA: Diagnosis not present

## 2016-09-26 DIAGNOSIS — E785 Hyperlipidemia, unspecified: Secondary | ICD-10-CM | POA: Diagnosis present

## 2016-09-26 DIAGNOSIS — C182 Malignant neoplasm of ascending colon: Secondary | ICD-10-CM | POA: Diagnosis present

## 2016-09-26 DIAGNOSIS — R740 Nonspecific elevation of levels of transaminase and lactic acid dehydrogenase [LDH]: Secondary | ICD-10-CM | POA: Diagnosis not present

## 2016-09-26 DIAGNOSIS — C799 Secondary malignant neoplasm of unspecified site: Secondary | ICD-10-CM | POA: Diagnosis not present

## 2016-09-26 DIAGNOSIS — C7800 Secondary malignant neoplasm of unspecified lung: Secondary | ICD-10-CM | POA: Diagnosis present

## 2016-09-26 DIAGNOSIS — R590 Localized enlarged lymph nodes: Secondary | ICD-10-CM | POA: Diagnosis present

## 2016-09-26 DIAGNOSIS — R630 Anorexia: Secondary | ICD-10-CM | POA: Diagnosis not present

## 2016-09-26 DIAGNOSIS — D72829 Elevated white blood cell count, unspecified: Secondary | ICD-10-CM | POA: Diagnosis present

## 2016-09-26 DIAGNOSIS — I10 Essential (primary) hypertension: Secondary | ICD-10-CM | POA: Diagnosis not present

## 2016-09-26 DIAGNOSIS — D259 Leiomyoma of uterus, unspecified: Secondary | ICD-10-CM | POA: Diagnosis present

## 2016-09-26 LAB — COMPREHENSIVE METABOLIC PANEL
ALT: 88 U/L — ABNORMAL HIGH (ref 14–54)
AST: 137 U/L — ABNORMAL HIGH (ref 15–41)
Albumin: 1.9 g/dL — ABNORMAL LOW (ref 3.5–5.0)
Alkaline Phosphatase: 632 U/L — ABNORMAL HIGH (ref 38–126)
Anion gap: 6 (ref 5–15)
BILIRUBIN TOTAL: 7.9 mg/dL — AB (ref 0.3–1.2)
BUN: 20 mg/dL (ref 6–20)
CHLORIDE: 110 mmol/L (ref 101–111)
CO2: 20 mmol/L — ABNORMAL LOW (ref 22–32)
CREATININE: 0.92 mg/dL (ref 0.44–1.00)
Calcium: 8.3 mg/dL — ABNORMAL LOW (ref 8.9–10.3)
Glucose, Bld: 73 mg/dL (ref 65–99)
POTASSIUM: 3.9 mmol/L (ref 3.5–5.1)
Sodium: 136 mmol/L (ref 135–145)
TOTAL PROTEIN: 5.9 g/dL — AB (ref 6.5–8.1)

## 2016-09-26 LAB — CBC
HCT: 26.3 % — ABNORMAL LOW (ref 36.0–46.0)
Hemoglobin: 8.5 g/dL — ABNORMAL LOW (ref 12.0–15.0)
MCH: 24.9 pg — AB (ref 26.0–34.0)
MCHC: 32.3 g/dL (ref 30.0–36.0)
MCV: 76.9 fL — AB (ref 78.0–100.0)
PLATELETS: 423 10*3/uL — AB (ref 150–400)
RBC: 3.42 MIL/uL — ABNORMAL LOW (ref 3.87–5.11)
RDW: 19.4 % — AB (ref 11.5–15.5)
WBC: 12.9 10*3/uL — ABNORMAL HIGH (ref 4.0–10.5)

## 2016-09-26 MED ORDER — LACTULOSE 10 GM/15ML PO SOLN
30.0000 g | Freq: Every day | ORAL | Status: DC
  Administered 2016-09-26: 30 g via ORAL
  Filled 2016-09-26: qty 45

## 2016-09-26 NOTE — Progress Notes (Signed)
PROGRESS NOTE    Lisa Guerra  [id number] DOB: 18-Jun-1946 DOA: 09/24/2016 PCP: [name], MD   Brief Narrative: 71 y.o.year old female with a history of anemia, hypertension, who   recently was diagnosed with metastatic colon cancer after colonoscopy Sep 07, 2016 by [facility], revealing a large mass at the cecum (adenocarcinoma)  . Staging with CT chest/abd/pelvis revealed numerous small pulmonary nodules consistent with metastases, extensive liver metastases.She was seen by Dr. Benay Spice with Oncology on 2/19 and felt to have synchronous right and left colon primaries. Liver biopsy arranged for 3/1 with plans for systemic therapy. She presented with complaints of pain under her right breast that started  several days ago associated with a dry cough. She denied shortness of breath. Patient was also scheduled for an ultrasound-guided biopsy today which was canceled because of anemia. Patient was asked to first get transfused, and reschedule biopsy for later. She denies any hematochezia or melena. Patient is being admitted for transfusion, prior to procedure by IR She was recently hospitalized at [facility] and discharged on 2/27, for chest pain. CT of the chest on 09/20/16 did not show any pulmonary embolus, multiple pulmonary nodules consistent with metastatic disease. Cardiac enzymes were found to be negative during that admission, EKG was unremarkable. Hemoglobin was 7.7 on 2/27 prior to discharge.    Assessment & Plan:   Active Problems:   Cancer of ascending colon ([place])   Anemia   Essential hypertension   Metastatic cancer ([place])   Anemia of chronic disease   Protein-calorie malnutrition, severe  1-Anemia of chronic disease likely secondary to underlying malignancy Received 2 units of PRBC.  Hb 9.6---8.5  2-Transaminases, hyperbilirubinemia; progressively worse LFT.  MRCP; Increase in large volume hepatic metastatic burden. New or increased mild right intrahepatic biliary duct  dilatation, likely due to mass effect from the dominant right-sided mass. No common duct dilatation. Discussed results of MRCP with Dr Paulita Fujita; he think transaminases is mostly primary related to liver mass and there is probably not significant obstructive component. He does not recommend ERCP or percutaneous drainage.  Discussed above with Dr Marin Olp, he was recommending IR evaluation.  IR Does not recommend percutaneous drainage.  -Will await for oncology recommendation, would patient be candidate for Chemotherapy ?   3-Large mass involving hepatic flexure;  Underwent Biopsy 3-2  4-Metastatic colon cancer with metastases to liver and lungs. Patient is followed by oncology in [place], Dr. Ammie Dalton. Oncology will see patient in consultation.   5-chest pain; suspect related to low hb . Resolved.   6-Failure to thrive;  Patient not eating enough. She report early satiety. Will see how she does with full liquid diet. Will continue with IV fluids.     DVT prophylaxis: scd.  Code Status: full code.  Family Communication: none at bedside.  Disposition Plan: poor oral intake, continue with IV fluids. Await oncology recommendation regarding option for chemo if at all.   Consultants:   Oncology   GI    Procedures: liver biopsy 3-2   Antimicrobials: none   Subjective: Not eating well, gets full very fast.     Objective: Vitals:   09/25/16 0557 09/25/16 1433 09/25/16 2111 09/26/16 0521  BP: (!) 145/93 (!) 139/95 121/85 (!) 140/97  Pulse: 81 87 90 83  Resp: 18 18 18 18   Temp: 98.2 F (36.8 C) 97.7 F (36.5 C) 98.8 F (37.1 C) 98 F (36.7 C)  TempSrc: Oral Oral Axillary Oral  SpO2: 100% 100% 100% 100%  Weight:  Height:        Intake/Output Summary (Last 24 hours) at 09/26/16 1112 Last data filed at 09/26/16 0914  Gross per 24 hour  Intake              430 ml  Output                0 ml  Net              430 ml   Filed Weights   09/24/16 1300  Weight: 46.4  kg (102 lb 6.4 oz)    Examination:  General exam: Appears calm and comfortable, thin appearing, icteric  Respiratory system: Clear to auscultation. Respiratory effort normal. Cardiovascular system: S1 & S2 heard, RRR. No JVD, murmurs, rubs, gallops or clicks. No pedal edema. Gastrointestinal system: Abdomen is nondistended, soft and mild tender. Normal bowel sounds heard. Central nervous system: Alert and oriented. No focal neurological deficits. Extremities: Symmetric 5 x 5 power. Skin: No rashes, lesions or ulcers    Data Reviewed: I have personally reviewed following labs and imaging studies  CBC:  Recent Labs Lab 09/20/16 1945 09/22/16 0555 09/24/16 0705 09/25/16 0518 09/26/16 0426  WBC 14.4* 15.8* 15.8* 17.5* 12.9*  HGB 7.9* 7.7* 6.6* 9.6* 8.5*  HCT 25.6* 25.3* 21.5* 29.8* 26.3*  MCV 77.3* 77.8* 75.4* 74.7* 76.9*  PLT 621* 646* 565* 477* [id number]*   Basic Metabolic Panel:  Recent Labs Lab 09/20/16 1945 09/22/16 0555 09/24/16 0705 09/24/16 1007 09/25/16 0518 09/26/16 0426  NA 141 135 136  --  137 136  K 3.8 4.0 4.1  --  4.1 3.9  CL 108 105 105  --  107 110  CO2 23 23 21*  --  22 20*  GLUCOSE 152* 105* 88  --  101* 73  BUN 29* 26* 30*  --  26* 20  CREATININE 1.50* 1.28* 1.19*  --  1.14* 0.92  CALCIUM 9.2 8.6* 8.7*  --  8.5* 8.3*  MG  --   --   --  2.0  --   --    GFR: Estimated Creatinine Clearance: 42.9 mL/min (by C-G formula based on SCr of 0.92 mg/dL). Liver Function Tests:  Recent Labs Lab 09/20/16 1945 09/22/16 0555 09/24/16 0705 09/25/16 0518 09/26/16 0426  AST 356* 249* 164* 148* 137*  ALT 257* 242* 171* 112* 88*  ALKPHOS 573* 582* 639* 678* 632*  BILITOT 1.7* 3.1* 5.5* 6.9* 7.9*  PROT 7.6 7.1 6.8 6.7 5.9*  ALBUMIN 2.9* 2.6* 2.5* 2.3* 1.9*    Recent Labs Lab 09/21/16 1225  LIPASE 23    Recent Labs Lab 09/25/16 1613  AMMONIA 30   Coagulation Profile:  Recent Labs Lab 09/24/16 1007  INR 1.06   Cardiac Enzymes:  Recent  Labs Lab 09/20/16 1945 09/21/16 1225 09/21/16 1915 09/22/16 0017  TROPONINI <0.03 <0.03 <0.03 <0.03   BNP (last 3 results) No results for input(s): PROBNP in the last 8760 hours. HbA1C: No results for input(s): HGBA1C in the last 72 hours. CBG: No results for input(s): GLUCAP in the last 168 hours. Lipid Profile: No results for input(s): CHOL, HDL, LDLCALC, TRIG, CHOLHDL, LDLDIRECT in the last 72 hours. Thyroid Function Tests:  Recent Labs  09/24/16 1007  TSH 2.084   Anemia Panel: No results for input(s): VITAMINB12, FOLATE, FERRITIN, TIBC, IRON, RETICCTPCT in the last 72 hours. Sepsis Labs: No results for input(s): PROCALCITON, LATICACIDVEN in the last 168 hours.  No results found for this or any previous visit (from  the past 240 hour(s)).       Radiology Studies: Mr 3d Recon At Scanner  Result Date: 09/25/2016 CLINICAL DATA:  Elevated liver function tests. Metastatic colon cancer after colonoscopy 09/07/2016. Cecal mass. EXAM: MRI ABDOMEN WITHOUT CONTRAST  (INCLUDING MRCP) TECHNIQUE: Multiplanar multisequence MR imaging of the abdomen was performed. Heavily T2-weighted images of the biliary and pancreatic ducts were obtained, and three-dimensional MRCP images were rendered by post processing. COMPARISON:  09/10/2016 abdominopelvic CT. FINDINGS: Lower chest: pulmonary metastasis, suboptimally evaluated. Normal heart size without pericardial or pleural effusion. Hepatobiliary: Extensive large volume hepatic metastatic burden. Index dominant right hepatic lobe lesion measures 12.5 x 8.3 cm on image 34/series 11002. Compare 11.6 x 9.2 cm on the prior. Index posterior left hepatic lobe lesion measures 3.9 x 2.3 cm on image 39/ series 11002. Compare 3.1 x 2.2 cm on the prior exam (when remeasured). Gallstones. Mild gallbladder distention without specific evidence of acute cholecystitis. Intrahepatic duct dilatation is mild in the right hepatic lobe. Example image 28/series 11002 and  image 24/series 9. This is either new or increased . Also more conspicuous secondary to better soft tissue contrast of MRI. The common duct is normal in caliber, without choledocholithiasis or obstructive process. Pancreas:  Normal, without mass or ductal dilatation. Spleen:  Normal in size, without focal abnormality. Adrenals/Urinary Tract: Normal left adrenal gland. Right adrenal metastasis measures 2.5 cm on image 53/ series 11002. Compare 2.1 cm on the prior exam (when remeasured). Mild renal cortical thinning bilaterally. No hydronephrosis. Stomach/Bowel: Normal caliber the stomach. No small bowel obstruction. Hepatic flexure colonic mass with direct pericolonic extension, better evaluated on prior CT. Vascular/Lymphatic: Normal caliber of the aorta and branch vessels. Extensive mass-effect upon the portal vein, including image 54/ series 11002. No portal vein thrombus. Extensive abdominal adenopathy. Example mass in the porta hepatis measures 5.9 x 3.3 cm on image 50/ series 11002. Compare 5.4 x 3.3 cm on the prior exam. A left retroperitoneal node measures 2.0 x 1.6 cm on image 55/series 11002. Compare 1.8 x 1.6 cm on the prior exam (when remeasured). Other:  Small volume ascites is increased. Musculoskeletal: S-shaped thoracolumbar spine curvature. IMPRESSION: 1. Increase in large volume hepatic metastatic burden. 2. New or increased mild right intrahepatic biliary duct dilatation, likely due to mass effect from the dominant right-sided mass. No common duct dilatation. 3. Abdominal adenopathy, mildly progressive. 4. Cholelithiasis. 5. Increase in small volume ascites. 6. Pulmonary metastasis. 7. Progressive right adrenal metastasis. Electronically Signed   By: Abigail Miyamoto M.D.   On: 09/25/2016 20:06   Ir US Guide Vasc Access Right  Result Date: 09/25/2016 CLINICAL DATA:  Colon carcinoma. Liver lesions. Needs durable venous access for chemotherapy regimen. EXAM: TUNNELED PORT CATHETER PLACEMENT WITH  ULTRASOUND AND FLUOROSCOPIC GUIDANCE FLUOROSCOPY TIME:  0.2 minutes (829 uGym2 DAP) ANESTHESIA/SEDATION: Intravenous Fentanyl and Versed were administered as conscious sedation during continuous monitoring of the patient's level of consciousness and physiological / cardiorespiratory status by the radiology RN, with a total moderate sedation time of 22 minutes. TECHNIQUE: The procedure, risks, benefits, and alternatives were explained to the patient. Questions regarding the procedure were encouraged and answered. The patient understands and consents to the procedure. As antibiotic prophylaxis, cefazolin 2 g was ordered pre-procedure and administered intravenously within one hour of incision. Patency of the right IJ vein was confirmed with ultrasound with image documentation. An appropriate skin site was determined. Skin site was marked. Region was prepped using maximum barrier technique including cap and mask, sterile gown,  sterile gloves, large sterile sheet, and Chlorhexidine as cutaneous antisepsis. The region was infiltrated locally with 1% lidocaine. Under real-time ultrasound guidance, the right IJ vein was accessed with a 21 gauge micropuncture needle; the needle tip within the vein was confirmed with ultrasound image documentation. Needle was exchanged over a 018 guidewire for transitional dilator which allowed passage of the [facility] wire into the IVC. Over this, the transitional dilator was exchanged for a 5 [place] MPA catheter. A small incision was made on the right anterior chest wall and a subcutaneous pocket fashioned. The power-injectable port was positioned and its catheter tunneled to the right IJ dermatotomy site. The MPA catheter was exchanged over an Amplatz wire for a peel-away sheath, through which the port catheter, which had been trimmed to the appropriate length, was advanced and positioned under fluoroscopy with its tip at the cavoatrial junction. Spot chest radiograph confirms good catheter  position and no pneumothorax. The pocket was closed with deep interrupted and subcuticular continuous 3-0 Monocryl sutures. The port was flushed per protocol. The incisions were covered with Dermabond then covered with a sterile dressing. COMPLICATIONS: COMPLICATIONS None immediate IMPRESSION: Technically successful right IJ power-injectable port catheter placement. Ready for routine use. Electronically Signed   By: Lucrezia Europe M.D.   On: 09/25/2016 08:22   Ir US Guide Bx Asp/drain  Result Date: 09/25/2016 CLINICAL DATA:  Colon carcinoma.  Multiple liver lesions. EXAM: ULTRASOUND GUIDED CORE BIOPSY OF LIVER LESION MEDICATIONS: Intravenous Fentanyl and Versed were administered as conscious sedation during continuous monitoring of the patient's level of consciousness and physiological / cardiorespiratory status by the radiology RN, with a total moderate sedation time of 7 minutes. PROCEDURE: The procedure, risks, benefits, and alternatives were explained to the patient. Questions regarding the procedure were encouraged and answered. The patient understands and consents to the procedure. Survey ultrasound of the liver performed. An appropriate representative right lobe lesion was localized and skin entry site determined and marked. The operative field was prepped with chlorhexidine in a sterile fashion, and a sterile drape was applied covering the operative field. A sterile gown and sterile gloves were used for the procedure. Local anesthesia was provided with 1% Lidocaine. Under real-time ultrasound guidance, a 17 gauge trocar needle was advanced to the margin of the lesion. Once needle tip position was confirmed, coaxial 18-gauge core biopsy samples were obtained, submitted in formalin to surgical pathology. The guide needle was removed. Postprocedure scans show no hemorrhage or other apparent complication. The patient tolerated the procedure well. COMPLICATIONS: None. FINDINGS: Multiple echogenic liver lesions  were localized corresponding to findings on recent CT. Representative core biopsy samples were obtained as above. IMPRESSION: 1. Technically successful ultrasound-guided core biopsy, liver lesion. Electronically Signed   By: Lucrezia Europe M.D.   On: 09/25/2016 08:24   Mr Abdomen With Mrcp W Contrast  Result Date: 09/25/2016 CLINICAL DATA:  Elevated liver function tests. Metastatic colon cancer after colonoscopy 09/07/2016. Cecal mass. EXAM: MRI ABDOMEN WITHOUT CONTRAST  (INCLUDING MRCP) TECHNIQUE: Multiplanar multisequence MR imaging of the abdomen was performed. Heavily T2-weighted images of the biliary and pancreatic ducts were obtained, and three-dimensional MRCP images were rendered by post processing. COMPARISON:  09/10/2016 abdominopelvic CT. FINDINGS: Lower chest: pulmonary metastasis, suboptimally evaluated. Normal heart size without pericardial or pleural effusion. Hepatobiliary: Extensive large volume hepatic metastatic burden. Index dominant right hepatic lobe lesion measures 12.5 x 8.3 cm on image 34/series 11002. Compare 11.6 x 9.2 cm on the prior. Index posterior left hepatic lobe lesion measures  3.9 x 2.3 cm on image 39/ series 11002. Compare 3.1 x 2.2 cm on the prior exam (when remeasured). Gallstones. Mild gallbladder distention without specific evidence of acute cholecystitis. Intrahepatic duct dilatation is mild in the right hepatic lobe. Example image 28/series 11002 and image 24/series 9. This is either new or increased . Also more conspicuous secondary to better soft tissue contrast of MRI. The common duct is normal in caliber, without choledocholithiasis or obstructive process. Pancreas:  Normal, without mass or ductal dilatation. Spleen:  Normal in size, without focal abnormality. Adrenals/Urinary Tract: Normal left adrenal gland. Right adrenal metastasis measures 2.5 cm on image 53/ series 11002. Compare 2.1 cm on the prior exam (when remeasured). Mild renal cortical thinning bilaterally. No  hydronephrosis. Stomach/Bowel: Normal caliber the stomach. No small bowel obstruction. Hepatic flexure colonic mass with direct pericolonic extension, better evaluated on prior CT. Vascular/Lymphatic: Normal caliber of the aorta and branch vessels. Extensive mass-effect upon the portal vein, including image 54/ series 11002. No portal vein thrombus. Extensive abdominal adenopathy. Example mass in the porta hepatis measures 5.9 x 3.3 cm on image 50/ series 11002. Compare 5.4 x 3.3 cm on the prior exam. A left retroperitoneal node measures 2.0 x 1.6 cm on image 55/series 11002. Compare 1.8 x 1.6 cm on the prior exam (when remeasured). Other:  Small volume ascites is increased. Musculoskeletal: S-shaped thoracolumbar spine curvature. IMPRESSION: 1. Increase in large volume hepatic metastatic burden. 2. New or increased mild right intrahepatic biliary duct dilatation, likely due to mass effect from the dominant right-sided mass. No common duct dilatation. 3. Abdominal adenopathy, mildly progressive. 4. Cholelithiasis. 5. Increase in small volume ascites. 6. Pulmonary metastasis. 7. Progressive right adrenal metastasis. Electronically Signed   By: Abigail Miyamoto M.D.   On: 09/25/2016 20:06   Ir Fluoro Guide Port Insertion Right  Result Date: 09/25/2016 CLINICAL DATA:  Colon carcinoma. Liver lesions. Needs durable venous access for chemotherapy regimen. EXAM: TUNNELED PORT CATHETER PLACEMENT WITH ULTRASOUND AND FLUOROSCOPIC GUIDANCE FLUOROSCOPY TIME:  0.2 minutes (829 uGym2 DAP) ANESTHESIA/SEDATION: Intravenous Fentanyl and Versed were administered as conscious sedation during continuous monitoring of the patient's level of consciousness and physiological / cardiorespiratory status by the radiology RN, with a total moderate sedation time of 22 minutes. TECHNIQUE: The procedure, risks, benefits, and alternatives were explained to the patient. Questions regarding the procedure were encouraged and answered. The patient  understands and consents to the procedure. As antibiotic prophylaxis, cefazolin 2 g was ordered pre-procedure and administered intravenously within one hour of incision. Patency of the right IJ vein was confirmed with ultrasound with image documentation. An appropriate skin site was determined. Skin site was marked. Region was prepped using maximum barrier technique including cap and mask, sterile gown, sterile gloves, large sterile sheet, and Chlorhexidine as cutaneous antisepsis. The region was infiltrated locally with 1% lidocaine. Under real-time ultrasound guidance, the right IJ vein was accessed with a 21 gauge micropuncture needle; the needle tip within the vein was confirmed with ultrasound image documentation. Needle was exchanged over a 018 guidewire for transitional dilator which allowed passage of the [facility] wire into the IVC. Over this, the transitional dilator was exchanged for a 5 [place] MPA catheter. A small incision was made on the right anterior chest wall and a subcutaneous pocket fashioned. The power-injectable port was positioned and its catheter tunneled to the right IJ dermatotomy site. The MPA catheter was exchanged over an Amplatz wire for a peel-away sheath, through which the port catheter, which had been  trimmed to the appropriate length, was advanced and positioned under fluoroscopy with its tip at the cavoatrial junction. Spot chest radiograph confirms good catheter position and no pneumothorax. The pocket was closed with deep interrupted and subcuticular continuous 3-0 Monocryl sutures. The port was flushed per protocol. The incisions were covered with Dermabond then covered with a sterile dressing. COMPLICATIONS: COMPLICATIONS None immediate IMPRESSION: Technically successful right IJ power-injectable port catheter placement. Ready for routine use. Electronically Signed   By: Lucrezia Europe M.D.   On: 09/25/2016 08:22        Scheduled Meds: . amLODipine  5 mg Oral Daily  . feeding  supplement (ENSURE ENLIVE)  237 mL Oral BID BM  . ferrous sulfate  325 mg Oral TID WC  . sodium chloride flush  3 mL Intravenous Q12H   Continuous Infusions: . sodium chloride 75 mL/hr at 09/26/16 0400     LOS: 0 days    Time spent: 35 minutes.     Elmarie Shiley, MD [facility] Pager [phone]  If 7PM-7AM, please contact night-coverage [email or web address] Password [email or web address] 09/26/2016, 11:12 AM

## 2016-09-26 NOTE — Progress Notes (Signed)
Case reviewed with Dr. Tyrell Antonio.  I do not think there is significant component of biliary obstruction, and think liver test elevation is likely predominantly due to high liver tumor burden.  I do not feel there is any role for ERCP or percutaneous drainage.  IR has seen patient and agrees that PTC is not indicated, furthermore would be contraindicated in light of need to traverse tumor even if PTC was pursued.  Additionally, even if LFTs rise, ERCP would not be an effective, safe, or viable alternative.  Unfortunately, there is nothing for [place] to offer.  Will sign-off.

## 2016-09-26 NOTE — Progress Notes (Signed)
I have reviewed the patient's imaging with Dr. Kathlene Cote.  The patient does not have significant intra-hepatic ductal dilatation to suggestive obstructive jaundice.  Her elevated TB is likely secondary to significant tumor burden at this time.  A PTC drain is not indicated as she does not have an obstruction noted on imaging.  Unfortunately, even if she did need a drain it would not be able to be placed by IR as we would have to go through significant tumor burden and that is not something that is safe due to risks of bleeding, etc.  If her TB continues to rise and there are new concerns for obstruction, then a repeat CT could be done later to further evaluate this concern.  This was discussed with Dr. Tyrell Antonio as well.  Lisa Guerra E 12:07 PM 09/26/2016

## 2016-09-26 NOTE — Consult Note (Signed)
Referral MD  Reason for Referral: Metastatic colon cancer with biliary obstruction   Chief Complaint  Patient presents with  . Chest Pain  : I had abdominal pain.  HPI: Lisa Guerra is a very charming 71 year old African-American female. She lives in [place].  She has metastatic colon cancer. She has seen Dr. Benay Spice. She has had a liver biopsy. 6.9. Back on February 19, her bilirubin was 0.35. On the 27th, it was 3.1. On March 1 it was 5.5. Her LFTs have been going up.  She does have some element of iron deficiency. Today, her hemoglobin is 8.5.  She had a CT scan done on admission. This showed no pulmonary embolus. She had pulmonary nodules that were unchanged. She had hepatic masses.  She had an MRI of the abdomen last night. This showed increase in large 5 hepatic disease. She has right intrahepatic biliary dilation. No common bile duct dilation. Progressive abdominal adenopathy is noted. Progressive right adrenal metastasis.  Her last CEA 2 weeks ago was 1200.  She is under better this morning. She is on clear liquids. She's having no fever. She's having no cough. She denies any abdominal pain.  We were asked to see her to try to help with management issues.    Past Medical History:  Diagnosis Date  . Anemia   . Cancer ([place])   . Colon cancer ([place])   . High cholesterol   . Hypertension   . Low hemoglobin   :  Past Surgical History:  Procedure Laterality Date  . CESAREAN SECTION     x2   . COLONOSCOPY N/A 09/07/2016   Dr. Wynetta Emery with [name]: two 5 mm polyps in descending colon, large tumor consistent with adenocarcinoma filled cecum/ascending colon, large multi-lobulated ulcerated polypoid lesion at recto-sigmoid junction 20 cm from anal verge.   . IR GENERIC HISTORICAL  09/24/2016   IR FLUORO GUIDE PORT INSERTION RIGHT 09/24/2016 Arne Cleveland, MD [facility]  . IR GENERIC HISTORICAL  09/24/2016   IR US GUIDE VASC ACCESS RIGHT 09/24/2016 Arne Cleveland, MD  [facility]  . IR GENERIC HISTORICAL  09/24/2016   IR US GUIDE BX ASP/DRAIN 09/24/2016 Arne Cleveland, MD [facility]  . TONSILLECTOMY    . TUBAL LIGATION    :   Current Facility-Administered Medications:  .  0.9 %  sodium chloride infusion, , Intravenous, Continuous, Belkys A Regalado, MD, Last Rate: 75 mL/hr at 09/26/16 0400 .  amLODipine (NORVASC) tablet 5 mg, 5 mg, Oral, Daily, Reyne Dumas, MD, 5 mg at 09/25/16 0942 .  aspirin EC tablet 162 mg, 162 mg, Oral, Q6H PRN, Reyne Dumas, MD .  feeding supplement (ENSURE ENLIVE) (ENSURE ENLIVE) liquid 237 mL, 237 mL, Oral, BID BM, Belkys A Regalado, MD .  ferrous sulfate tablet 325 mg, 325 mg, Oral, TID WC, Reyne Dumas, MD, 325 mg at 09/25/16 1249 .  levalbuterol (XOPENEX) nebulizer solution 0.63 mg, 0.63 mg, Nebulization, Q6H PRN, Reyne Dumas, MD .  ondansetron (ZOFRAN) tablet 4 mg, 4 mg, Oral, Q6H PRN **OR** ondansetron (ZOFRAN) injection 4 mg, 4 mg, Intravenous, Q6H PRN, Reyne Dumas, MD .  oxyCODONE (Oxy IR/ROXICODONE) immediate release tablet 5 mg, 5 mg, Oral, Q4H PRN, Reyne Dumas, MD, 5 mg at 09/25/16 0946 .  polyethylene glycol (MIRALAX / GLYCOLAX) packet 17 g, 17 g, Oral, Daily PRN, Reyne Dumas, MD .  sodium chloride flush (NS) 0.9 % injection 10-40 mL, 10-40 mL, Intracatheter, PRN, Reyne Dumas, MD .  sodium chloride flush (NS) 0.9 %  injection 3 mL, 3 mL, Intravenous, Q12H, Reyne Dumas, MD:  . amLODipine  5 mg Oral Daily  . feeding supplement (ENSURE ENLIVE)  237 mL Oral BID BM  . ferrous sulfate  325 mg Oral TID WC  . sodium chloride flush  3 mL Intravenous Q12H  :  No Known Allergies:  Family History  Problem Relation Age of Onset  . Pancreatic cancer Mother   . Leukemia Father   . Colon cancer Brother   :  Social History   Social History  . Marital status: Married    Spouse name: N/A  . Number of children: N/A  . Years of education: N/A   Occupational History  . Not on file.   Social History Main Topics   . Smoking status: Never Smoker  . Smokeless tobacco: Never Used  . Alcohol use No  . Drug use: No  . Sexual activity: Yes   Other Topics Concern  . Not on file   Social History Narrative  . No narrative on file  :  Pertinent items are noted in HPI.  Exam: Patient Vitals for the past 24 hrs:  BP Temp Temp src Pulse Resp SpO2  09/26/16 0521 (!) 140/97 98 F (36.7 C) Oral 83 18 100 %  09/25/16 2111 121/85 98.8 F (37.1 C) Axillary 90 18 100 %  09/25/16 1433 (!) 139/95 97.7 F (36.5 C) Oral 87 18 100 %    As above    Recent Labs  09/25/16 0518 09/26/16 0426  WBC 17.5* 12.9*  HGB 9.6* 8.5*  HCT 29.8* 26.3*  PLT 477* 423*    Recent Labs  09/25/16 0518 09/26/16 0426  NA 137 136  K 4.1 3.9  CL 107 110  CO2 22 20*  GLUCOSE 101* 73  BUN 26* 20  CREATININE 1.14* 0.92  CALCIUM 8.5* 8.3*    Blood smear review:  None   Pathology: None     Assessment and Plan:  Lisa Guerra is a 71 year old African-American female. She has widely metastatic colon cancer. She now has progressive biliary obstruction.  I think that if she is going to be treated, we have to relieve the biliary obstruction. I don't know this can be done with gastroenterology and a biliary stent placed. It looks like this is all intrahepatic so she might need to have a percutaneous biliary stent placed by interventional radiology.  She is quite anemic. She probably needs to have iron  replacement therapy.  She looks like she is in good shape. As such, I think that it would be very reasonable to treat her. I suspect that she will respond to systemic chemotherapy. Unfortunately, we are not able to treat her until the bilirubin comes down.  We will follow along. I have appreciated the opportunity of seeing her. She is very nice.   Lattie Haw, MD  [place] 12:12

## 2016-09-27 DIAGNOSIS — D509 Iron deficiency anemia, unspecified: Secondary | ICD-10-CM

## 2016-09-27 DIAGNOSIS — R630 Anorexia: Secondary | ICD-10-CM

## 2016-09-27 DIAGNOSIS — C779 Secondary and unspecified malignant neoplasm of lymph node, unspecified: Secondary | ICD-10-CM

## 2016-09-27 DIAGNOSIS — C189 Malignant neoplasm of colon, unspecified: Secondary | ICD-10-CM

## 2016-09-27 DIAGNOSIS — E43 Unspecified severe protein-calorie malnutrition: Secondary | ICD-10-CM

## 2016-09-27 DIAGNOSIS — C7800 Secondary malignant neoplasm of unspecified lung: Secondary | ICD-10-CM

## 2016-09-27 DIAGNOSIS — D500 Iron deficiency anemia secondary to blood loss (chronic): Secondary | ICD-10-CM

## 2016-09-27 DIAGNOSIS — R740 Nonspecific elevation of levels of transaminase and lactic acid dehydrogenase [LDH]: Secondary | ICD-10-CM

## 2016-09-27 LAB — CBC
HCT: 27 % — ABNORMAL LOW (ref 36.0–46.0)
Hemoglobin: 8.5 g/dL — ABNORMAL LOW (ref 12.0–15.0)
MCH: 24.4 pg — ABNORMAL LOW (ref 26.0–34.0)
MCHC: 31.5 g/dL (ref 30.0–36.0)
MCV: 77.4 fL — ABNORMAL LOW (ref 78.0–100.0)
PLATELETS: 435 10*3/uL — AB (ref 150–400)
RBC: 3.49 MIL/uL — ABNORMAL LOW (ref 3.87–5.11)
RDW: 20.3 % — AB (ref 11.5–15.5)
WBC: 14 10*3/uL — ABNORMAL HIGH (ref 4.0–10.5)

## 2016-09-27 LAB — COMPREHENSIVE METABOLIC PANEL
ALBUMIN: 1.9 g/dL — AB (ref 3.5–5.0)
ALT: 93 U/L — ABNORMAL HIGH (ref 14–54)
ANION GAP: 8 (ref 5–15)
AST: 144 U/L — ABNORMAL HIGH (ref 15–41)
Alkaline Phosphatase: 673 U/L — ABNORMAL HIGH (ref 38–126)
BUN: 19 mg/dL (ref 6–20)
CO2: 19 mmol/L — ABNORMAL LOW (ref 22–32)
Calcium: 8.3 mg/dL — ABNORMAL LOW (ref 8.9–10.3)
Chloride: 108 mmol/L (ref 101–111)
Creatinine, Ser: 0.78 mg/dL (ref 0.44–1.00)
GFR calc Af Amer: >60 mL/min (ref >60)
GFR calc non Af Amer: >60 mL/min (ref >60)
GLUCOSE: 72 mg/dL (ref 65–99)
POTASSIUM: 3.9 mmol/L (ref 3.5–5.1)
SODIUM: 135 mmol/L (ref 135–145)
TOTAL PROTEIN: 5.8 g/dL — AB (ref 6.5–8.1)
Total Bilirubin: 9.5 mg/dL — ABNORMAL HIGH (ref 0.3–1.2)

## 2016-09-27 MED ORDER — LACTULOSE 10 GM/15ML PO SOLN
20.0000 g | Freq: Every day | ORAL | Status: DC
  Administered 2016-09-27 – 2016-09-29 (×3): 20 g via ORAL
  Filled 2016-09-27 (×3): qty 30

## 2016-09-27 MED ORDER — SODIUM CHLORIDE 0.9 % IV SOLN
510.0000 mg | Freq: Once | INTRAVENOUS | Status: AC
  Administered 2016-09-27: 510 mg via INTRAVENOUS
  Filled 2016-09-27: qty 17

## 2016-09-27 NOTE — Progress Notes (Signed)
Lisa Guerra   DOB:1945-08-22   [id number]   LO:[id number]  ONCOLOGY FOLLOW UP   Subjective: I am covering Dr. Benay Spice this weekend, and was requested by hospitalist Dr. Tyrell Antonio to f/u and see pt today. She was seen by gastroenterologist Dr. Paulita Fujita and interventional radiologist Dr. Kathlene Cote yesterday, both felt her elevated bilirubin are related to tumor burden in the liver, and no ERCP or percutaneous biliary drainage for recommended. Patient denies any significant pain, has no appetite, fatigue, no other new complaints.   Objective:  Vitals:   09/26/16 2212 09/27/16 0505  BP: 130/88 137/88  Pulse: 89 78  Resp: 18 17  Temp: 99.5 F (37.5 C) 97.8 F (36.6 C)    Body mass index is 16.53 kg/m.  Intake/Output Summary (Last 24 hours) at 09/27/16 1153 Last data filed at 09/27/16 0914  Gross per 24 hour  Intake           2322.5 ml  Output                0 ml  Net           2322.5 ml     Sclerae unicteric  Oropharynx clear  No peripheral adenopathy  Lungs clear -- no rales or rhonchi  Heart regular rate and rhythm  Abdomen benign  MSK no focal spinal tenderness, no peripheral edema  Neuro nonfocal   CBG (last 3)  No results for input(s): GLUCAP in the last 72 hours.   Labs:  Lab Results  Component Value Date   WBC 14.0 (H) 09/27/2016   HGB 8.5 (L) 09/27/2016   HCT 27.0 (L) 09/27/2016   MCV 77.4 (L) 09/27/2016   PLT 435 (H) 09/27/2016   NEUTROABS 8.5 (H) 09/14/2016   CMP Latest Ref Rng & Units 09/27/2016 09/26/2016 09/25/2016  Glucose 65 - 99 mg/dL 72 73 101(H)  BUN 6 - 20 mg/dL 19 20 26(H)  Creatinine 0.44 - 1.00 mg/dL 0.78 0.92 1.14(H)  Sodium 135 - 145 mmol/L 135 136 137  Potassium 3.5 - 5.1 mmol/L 3.9 3.9 4.1  Chloride 101 - 111 mmol/L 108 110 107  CO2 22 - 32 mmol/L 19(L) 20(L) 22  Calcium 8.9 - 10.3 mg/dL 8.3(L) 8.3(L) 8.5(L)  Total Protein 6.5 - 8.1 g/dL 5.8(L) 5.9(L) 6.7  Total Bilirubin 0.3 - 1.2 mg/dL 9.5(H) 7.9(H) 6.9(H)  Alkaline Phos 38 - 126  U/L 673(H) 632(H) 678(H)  AST 15 - 41 U/L 144(H) 137(H) 148(H)  ALT 14 - 54 U/L 93(H) 88(H) 112(H)     Urine Studies No results for input(s): UHGB, CRYS in the last 72 hours.  Invalid input(s): UACOL, UAPR, USPG, UPH, UTP, UGL, UKET, UBIL, UNIT, UROB, [place], UEPI, UWBC, Duwayne Heck [place], [place]  Basic Metabolic Panel:  Recent Labs Lab 09/22/16 0555 09/24/16 0705 09/24/16 1007 09/25/16 0518 09/26/16 0426 09/27/16 0622  NA 135 136  --  137 136 135  K 4.0 4.1  --  4.1 3.9 3.9  CL 105 105  --  107 110 108  CO2 23 21*  --  22 20* 19*  GLUCOSE 105* 88  --  101* 73 72  BUN 26* 30*  --  26* 20 19  CREATININE 1.28* 1.19*  --  1.14* 0.92 0.78  CALCIUM 8.6* 8.7*  --  8.5* 8.3* 8.3*  MG  --   --  2.0  --   --   --    GFR Estimated Creatinine Clearance: 49.3 mL/min (by C-G formula based  on SCr of 0.78 mg/dL). Liver Function Tests:  Recent Labs Lab 09/22/16 0555 09/24/16 0705 09/25/16 0518 09/26/16 0426 09/27/16 0622  AST 249* 164* 148* 137* 144*  ALT 242* 171* 112* 88* 93*  ALKPHOS 582* 639* 678* 632* 673*  BILITOT 3.1* 5.5* 6.9* 7.9* 9.5*  PROT 7.1 6.8 6.7 5.9* 5.8*  ALBUMIN 2.6* 2.5* 2.3* 1.9* 1.9*    Recent Labs Lab 09/21/16 1225  LIPASE 23    Recent Labs Lab 09/25/16 1613  AMMONIA 30   Coagulation profile  Recent Labs Lab 09/24/16 1007  INR 1.06    CBC:  Recent Labs Lab 09/22/16 0555 09/24/16 0705 09/25/16 0518 09/26/16 0426 09/27/16 0622  WBC 15.8* 15.8* 17.5* 12.9* 14.0*  HGB 7.7* 6.6* 9.6* 8.5* 8.5*  HCT 25.3* 21.5* 29.8* 26.3* 27.0*  MCV 77.8* 75.4* 74.7* 76.9* 77.4*  PLT 646* 565* 477* 423* 435*   Cardiac Enzymes:  Recent Labs Lab 09/20/16 1945 09/21/16 1225 09/21/16 1915 09/22/16 0017  TROPONINI <0.03 <0.03 <0.03 <0.03   BNP: Invalid input(s): POCBNP CBG: No results for input(s): GLUCAP in the last 168 hours. D-Dimer No results for input(s): DDIMER in the last 72 hours. Hgb A1c No results for input(s): HGBA1C in the  last 72 hours. Lipid Profile No results for input(s): CHOL, HDL, LDLCALC, TRIG, CHOLHDL, LDLDIRECT in the last 72 hours. Thyroid function studies No results for input(s): TSH, T4TOTAL, T3FREE, THYROIDAB in the last 72 hours.  Invalid input(s): FREET3 Anemia work up No results for input(s): VITAMINB12, FOLATE, FERRITIN, TIBC, IRON, RETICCTPCT in the last 72 hours. Microbiology No results found for this or any previous visit (from the past 240 hour(s)).    Studies:  Mr 3d Recon At Scanner  Result Date: 09/25/2016 CLINICAL DATA:  Elevated liver function tests. Metastatic colon cancer after colonoscopy 09/07/2016. Cecal mass. EXAM: MRI ABDOMEN WITHOUT CONTRAST  (INCLUDING MRCP) TECHNIQUE: Multiplanar multisequence MR imaging of the abdomen was performed. Heavily T2-weighted images of the biliary and pancreatic ducts were obtained, and three-dimensional MRCP images were rendered by post processing. COMPARISON:  09/10/2016 abdominopelvic CT. FINDINGS: Lower chest: pulmonary metastasis, suboptimally evaluated. Normal heart size without pericardial or pleural effusion. Hepatobiliary: Extensive large volume hepatic metastatic burden. Index dominant right hepatic lobe lesion measures 12.5 x 8.3 cm on image 34/series 11002. Compare 11.6 x 9.2 cm on the prior. Index posterior left hepatic lobe lesion measures 3.9 x 2.3 cm on image 39/ series 11002. Compare 3.1 x 2.2 cm on the prior exam (when remeasured). Gallstones. Mild gallbladder distention without specific evidence of acute cholecystitis. Intrahepatic duct dilatation is mild in the right hepatic lobe. Example image 28/series 11002 and image 24/series 9. This is either new or increased . Also more conspicuous secondary to better soft tissue contrast of MRI. The common duct is normal in caliber, without choledocholithiasis or obstructive process. Pancreas:  Normal, without mass or ductal dilatation. Spleen:  Normal in size, without focal abnormality.  Adrenals/Urinary Tract: Normal left adrenal gland. Right adrenal metastasis measures 2.5 cm on image 53/ series 11002. Compare 2.1 cm on the prior exam (when remeasured). Mild renal cortical thinning bilaterally. No hydronephrosis. Stomach/Bowel: Normal caliber the stomach. No small bowel obstruction. Hepatic flexure colonic mass with direct pericolonic extension, better evaluated on prior CT. Vascular/Lymphatic: Normal caliber of the aorta and branch vessels. Extensive mass-effect upon the portal vein, including image 54/ series 11002. No portal vein thrombus. Extensive abdominal adenopathy. Example mass in the porta hepatis measures 5.9 x 3.3 cm on image 50/  series 11002. Compare 5.4 x 3.3 cm on the prior exam. A left retroperitoneal node measures 2.0 x 1.6 cm on image 55/series 11002. Compare 1.8 x 1.6 cm on the prior exam (when remeasured). Other:  Small volume ascites is increased. Musculoskeletal: S-shaped thoracolumbar spine curvature. IMPRESSION: 1. Increase in large volume hepatic metastatic burden. 2. New or increased mild right intrahepatic biliary duct dilatation, likely due to mass effect from the dominant right-sided mass. No common duct dilatation. 3. Abdominal adenopathy, mildly progressive. 4. Cholelithiasis. 5. Increase in small volume ascites. 6. Pulmonary metastasis. 7. Progressive right adrenal metastasis. Electronically Signed   By: Abigail Miyamoto M.D.   On: 09/25/2016 20:06   Mr Abdomen With Mrcp W Contrast  Result Date: 09/25/2016 CLINICAL DATA:  Elevated liver function tests. Metastatic colon cancer after colonoscopy 09/07/2016. Cecal mass. EXAM: MRI ABDOMEN WITHOUT CONTRAST  (INCLUDING MRCP) TECHNIQUE: Multiplanar multisequence MR imaging of the abdomen was performed. Heavily T2-weighted images of the biliary and pancreatic ducts were obtained, and three-dimensional MRCP images were rendered by post processing. COMPARISON:  09/10/2016 abdominopelvic CT. FINDINGS: Lower chest: pulmonary  metastasis, suboptimally evaluated. Normal heart size without pericardial or pleural effusion. Hepatobiliary: Extensive large volume hepatic metastatic burden. Index dominant right hepatic lobe lesion measures 12.5 x 8.3 cm on image 34/series 11002. Compare 11.6 x 9.2 cm on the prior. Index posterior left hepatic lobe lesion measures 3.9 x 2.3 cm on image 39/ series 11002. Compare 3.1 x 2.2 cm on the prior exam (when remeasured). Gallstones. Mild gallbladder distention without specific evidence of acute cholecystitis. Intrahepatic duct dilatation is mild in the right hepatic lobe. Example image 28/series 11002 and image 24/series 9. This is either new or increased . Also more conspicuous secondary to better soft tissue contrast of MRI. The common duct is normal in caliber, without choledocholithiasis or obstructive process. Pancreas:  Normal, without mass or ductal dilatation. Spleen:  Normal in size, without focal abnormality. Adrenals/Urinary Tract: Normal left adrenal gland. Right adrenal metastasis measures 2.5 cm on image 53/ series 11002. Compare 2.1 cm on the prior exam (when remeasured). Mild renal cortical thinning bilaterally. No hydronephrosis. Stomach/Bowel: Normal caliber the stomach. No small bowel obstruction. Hepatic flexure colonic mass with direct pericolonic extension, better evaluated on prior CT. Vascular/Lymphatic: Normal caliber of the aorta and branch vessels. Extensive mass-effect upon the portal vein, including image 54/ series 11002. No portal vein thrombus. Extensive abdominal adenopathy. Example mass in the porta hepatis measures 5.9 x 3.3 cm on image 50/ series 11002. Compare 5.4 x 3.3 cm on the prior exam. A left retroperitoneal node measures 2.0 x 1.6 cm on image 55/series 11002. Compare 1.8 x 1.6 cm on the prior exam (when remeasured). Other:  Small volume ascites is increased. Musculoskeletal: S-shaped thoracolumbar spine curvature. IMPRESSION: 1. Increase in large volume hepatic  metastatic burden. 2. New or increased mild right intrahepatic biliary duct dilatation, likely due to mass effect from the dominant right-sided mass. No common duct dilatation. 3. Abdominal adenopathy, mildly progressive. 4. Cholelithiasis. 5. Increase in small volume ascites. 6. Pulmonary metastasis. 7. Progressive right adrenal metastasis. Electronically Signed   By: Abigail Miyamoto M.D.   On: 09/25/2016 20:06    Assessment: 71 y.o. AAF  1. Metastatic right-sided colon cancer to liver, nodes and the lungs, newly diagnosed 2. Transaminitis and hyperbilirubinemia, secondary to liver metastasis 3. Anemia of iron deficiency, and anemia in neoplastic disease 4. Severe calori and protein malnutrition 5. Anorexia and weight loss   Recommendations -I reviewed her  imaging findings with pt, her son and daughter-in-law in details  -Her liver biopsy results still pending, but we have previously certain this is diffusely metastatic colon cancer to liver, abdominal nodes, and lungs, with large tumor burden. -We discussed the goal of care is palliative, this is not curable disease unfortunate. She voiced good understanding and agrees.  -We discussed the role of palliative chemotherapy, such as FOLFOX. Due to her significant elevated liver enzymes and total bilirubin, she is at high risk for complications and side effects. Her performance status is also very limited, not a great candidate for intensive chemo. However she was in good health before cancer diagnosis, and her tumor appears to be very aggressive, and it's also reasonable to try low dose FOLFOX or 5-fu alone for first cycle to see how see dose.  -alternatively, and it will be very appropriate to consider palliative care alone, such as hospice. We discussed the hospice service, and symptom management they offer.  -We also discussed targeted therapy and immunotherapy, pending the genetic testing results.  -Pt and her family will think about what they want  to do, and discuss with Dr. Learta Codding again tomorrow.  -I recommend iv feraheme today for her iron deficient anemia. She has been taking oral iron for a few years, her iron level was still very low lately. Potential side effects, especially transfusion reaction including anaphylactic ration, we'll discuss with her, she agrees to proceed. I ordered. -I appreciate the opportunity to participate in this lovely lady's care.  -Dr. Learta Codding will see her tomorrow.    Plan:  -IV feraheme 510mg  tody, I ordered.  -we discussed palliative chemo vs hospice.   Truitt Merle, MD 09/27/2016  11:53 AM

## 2016-09-27 NOTE — Progress Notes (Signed)
PROGRESS NOTE    Lisa Guerra  [id number] DOB: Jul 16, 1946 DOA: 09/24/2016 PCP: [name], MD   Brief Narrative: 71 y.o.year old female with a history of anemia, hypertension, who   recently was diagnosed with metastatic colon cancer after colonoscopy Sep 07, 2016 by [facility], revealing a large mass at the cecum (adenocarcinoma)  . Staging with CT chest/abd/pelvis revealed numerous small pulmonary nodules consistent with metastases, extensive liver metastases.She was seen by Dr. Benay Spice with Oncology on 2/19 and felt to have synchronous right and left colon primaries. Liver biopsy arranged for 3/1 with plans for systemic therapy. She presented with complaints of pain under her right breast that started  several days ago associated with a dry cough. She denied shortness of breath. Patient was also scheduled for an ultrasound-guided biopsy today which was canceled because of anemia. Patient was asked to first get transfused, and reschedule biopsy for later. She denies any hematochezia or melena. Patient is being admitted for transfusion, prior to procedure by IR She was recently hospitalized at [facility] and discharged on 2/27, for chest pain. CT of the chest on 09/20/16 did not show any pulmonary embolus, multiple pulmonary nodules consistent with metastatic disease. Cardiac enzymes were found to be negative during that admission, EKG was unremarkable. Hemoglobin was 7.7 on 2/27 prior to discharge.    Assessment & Plan:   Active Problems:   Cancer of ascending colon ([place])   Anemia   Essential hypertension   Metastatic cancer ([place])   Anemia of chronic disease   Protein-calorie malnutrition, severe  1-Anemia of chronic disease likely secondary to underlying malignancy Received 2 units of PRBC.  Hb 9.6---8.5---stable at 8.5.  2-Transaminases, hyperbilirubinemia; progressively worse LFT.  MRCP; Increase in large volume hepatic metastatic burden. New or increased mild right intrahepatic  biliary duct dilatation, likely due to mass effect from the dominant right-sided mass. No common duct dilatation. -Discussed results of MRCP with Dr Paulita Fujita; he think transaminases is mostly primary related to tumor borden  and there is probably not significant obstructive component. He does not recommend ERCP or percutaneous drainage.  -Discussed above with Dr Marin Olp, he was recommending IR evaluation.  -IR Does not recommend percutaneous drainage.  -Started on lactulose.  -Will await for oncology recommendation, would patient be candidate for Chemotherapy ?   3-Large mass involving hepatic flexure;  Underwent Biopsy 3-2  4-Metastatic colon cancer with metastases to liver and lungs. Patient is followed by oncology in [place], Dr. Ammie Dalton. Oncology will see patient in consultation.   5-chest pain; suspect related to low hb . Resolved.   6-Failure to thrive;  Patient not eating enough. She report early satiety. Will see how she does with full liquid diet. Will continue with IV fluids.   7-Leukocytosis; no fever. Levels fluctuates.   DVT prophylaxis: scd.  Code Status: full code.  Family Communication: none at bedside.  Disposition Plan: poor oral intake, continue with IV fluids. Await oncology recommendation regarding option for chemo if at all.   Consultants:   Oncology   GI    Procedures: liver biopsy 3-2   Antimicrobials: none   Subjective: Had BM, denies abdominal pain.     Objective: Vitals:   09/26/16 0521 09/26/16 1310 09/26/16 2212 09/27/16 0505  BP: (!) 140/97 124/80 130/88 137/88  Pulse: 83 96 89 78  Resp: 18 18 18 17   Temp: 98 F (36.7 C) 97.3 F (36.3 C) 99.5 F (37.5 C) 97.8 F (36.6 C)  TempSrc: Oral Oral Oral Oral  SpO2: 100% 100% 100% 100%  Weight:      Height:        Intake/Output Summary (Last 24 hours) at 09/27/16 0900 Last data filed at 09/27/16 [id number]  Gross per 24 hour  Intake           2212.5 ml  Output                0 ml  Net            2212.5 ml   Filed Weights   09/24/16 1300  Weight: 46.4 kg (102 lb 6.4 oz)    Examination:  General exam: Appears calm and comfortable, thin appearing, icteric  Respiratory system: Clear to auscultation. Respiratory effort normal. Cardiovascular system: S1 & S2 heard, RRR. No JVD, murmurs, rubs, gallops or clicks. No pedal edema. Gastrointestinal system: Abdomen is nondistended, soft and mild tender. Normal bowel sounds heard. Central nervous system: Alert and oriented. No focal neurological deficits. Extremities: Symmetric 5 x 5 power. Skin: No rashes, lesions or ulcers    Data Reviewed: I have personally reviewed following labs and imaging studies  CBC:  Recent Labs Lab 09/22/16 0555 09/24/16 0705 09/25/16 0518 09/26/16 0426 09/27/16 0622  WBC 15.8* 15.8* 17.5* 12.9* 14.0*  HGB 7.7* 6.6* 9.6* 8.5* 8.5*  HCT 25.3* 21.5* 29.8* 26.3* 27.0*  MCV 77.8* 75.4* 74.7* 76.9* 77.4*  PLT 646* 565* 477* 423* [id number]*   Basic Metabolic Panel:  Recent Labs Lab 09/22/16 0555 09/24/16 0705 09/24/16 1007 09/25/16 0518 09/26/16 0426 09/27/16 0622  NA 135 136  --  137 136 135  K 4.0 4.1  --  4.1 3.9 3.9  CL 105 105  --  107 110 108  CO2 23 21*  --  22 20* 19*  GLUCOSE 105* 88  --  101* 73 72  BUN 26* 30*  --  26* 20 19  CREATININE 1.28* 1.19*  --  1.14* 0.92 0.78  CALCIUM 8.6* 8.7*  --  8.5* 8.3* 8.3*  MG  --   --  2.0  --   --   --    GFR: Estimated Creatinine Clearance: 49.3 mL/min (by C-G formula based on SCr of 0.78 mg/dL). Liver Function Tests:  Recent Labs Lab 09/22/16 0555 09/24/16 0705 09/25/16 0518 09/26/16 0426 09/27/16 0622  AST 249* 164* 148* 137* 144*  ALT 242* 171* 112* 88* 93*  ALKPHOS 582* 639* 678* 632* 673*  BILITOT 3.1* 5.5* 6.9* 7.9* 9.5*  PROT 7.1 6.8 6.7 5.9* 5.8*  ALBUMIN 2.6* 2.5* 2.3* 1.9* 1.9*    Recent Labs Lab 09/21/16 1225  LIPASE 23    Recent Labs Lab 09/25/16 1613  AMMONIA 30   Coagulation Profile:  Recent  Labs Lab 09/24/16 1007  INR 1.06   Cardiac Enzymes:  Recent Labs Lab 09/20/16 1945 09/21/16 1225 09/21/16 1915 09/22/16 0017  TROPONINI <0.03 <0.03 <0.03 <0.03   BNP (last 3 results) No results for input(s): PROBNP in the last 8760 hours. HbA1C: No results for input(s): HGBA1C in the last 72 hours. CBG: No results for input(s): GLUCAP in the last 168 hours. Lipid Profile: No results for input(s): CHOL, HDL, LDLCALC, TRIG, CHOLHDL, LDLDIRECT in the last 72 hours. Thyroid Function Tests:  Recent Labs  09/24/16 1007  TSH 2.084   Anemia Panel: No results for input(s): VITAMINB12, FOLATE, FERRITIN, TIBC, IRON, RETICCTPCT in the last 72 hours. Sepsis Labs: No results for input(s): PROCALCITON, LATICACIDVEN in the last 168 hours.  No results found for  this or any previous visit (from the past 240 hour(s)).       Radiology Studies: Mr 3d Recon At Scanner  Result Date: 09/25/2016 CLINICAL DATA:  Elevated liver function tests. Metastatic colon cancer after colonoscopy 09/07/2016. Cecal mass. EXAM: MRI ABDOMEN WITHOUT CONTRAST  (INCLUDING MRCP) TECHNIQUE: Multiplanar multisequence MR imaging of the abdomen was performed. Heavily T2-weighted images of the biliary and pancreatic ducts were obtained, and three-dimensional MRCP images were rendered by post processing. COMPARISON:  09/10/2016 abdominopelvic CT. FINDINGS: Lower chest: pulmonary metastasis, suboptimally evaluated. Normal heart size without pericardial or pleural effusion. Hepatobiliary: Extensive large volume hepatic metastatic burden. Index dominant right hepatic lobe lesion measures 12.5 x 8.3 cm on image 34/series 11002. Compare 11.6 x 9.2 cm on the prior. Index posterior left hepatic lobe lesion measures 3.9 x 2.3 cm on image 39/ series 11002. Compare 3.1 x 2.2 cm on the prior exam (when remeasured). Gallstones. Mild gallbladder distention without specific evidence of acute cholecystitis. Intrahepatic duct dilatation is  mild in the right hepatic lobe. Example image 28/series 11002 and image 24/series 9. This is either new or increased . Also more conspicuous secondary to better soft tissue contrast of MRI. The common duct is normal in caliber, without choledocholithiasis or obstructive process. Pancreas:  Normal, without mass or ductal dilatation. Spleen:  Normal in size, without focal abnormality. Adrenals/Urinary Tract: Normal left adrenal gland. Right adrenal metastasis measures 2.5 cm on image 53/ series 11002. Compare 2.1 cm on the prior exam (when remeasured). Mild renal cortical thinning bilaterally. No hydronephrosis. Stomach/Bowel: Normal caliber the stomach. No small bowel obstruction. Hepatic flexure colonic mass with direct pericolonic extension, better evaluated on prior CT. Vascular/Lymphatic: Normal caliber of the aorta and branch vessels. Extensive mass-effect upon the portal vein, including image 54/ series 11002. No portal vein thrombus. Extensive abdominal adenopathy. Example mass in the porta hepatis measures 5.9 x 3.3 cm on image 50/ series 11002. Compare 5.4 x 3.3 cm on the prior exam. A left retroperitoneal node measures 2.0 x 1.6 cm on image 55/series 11002. Compare 1.8 x 1.6 cm on the prior exam (when remeasured). Other:  Small volume ascites is increased. Musculoskeletal: S-shaped thoracolumbar spine curvature. IMPRESSION: 1. Increase in large volume hepatic metastatic burden. 2. New or increased mild right intrahepatic biliary duct dilatation, likely due to mass effect from the dominant right-sided mass. No common duct dilatation. 3. Abdominal adenopathy, mildly progressive. 4. Cholelithiasis. 5. Increase in small volume ascites. 6. Pulmonary metastasis. 7. Progressive right adrenal metastasis. Electronically Signed   By: Abigail Miyamoto M.D.   On: 09/25/2016 20:06   Mr Abdomen With Mrcp W Contrast  Result Date: 09/25/2016 CLINICAL DATA:  Elevated liver function tests. Metastatic colon cancer after  colonoscopy 09/07/2016. Cecal mass. EXAM: MRI ABDOMEN WITHOUT CONTRAST  (INCLUDING MRCP) TECHNIQUE: Multiplanar multisequence MR imaging of the abdomen was performed. Heavily T2-weighted images of the biliary and pancreatic ducts were obtained, and three-dimensional MRCP images were rendered by post processing. COMPARISON:  09/10/2016 abdominopelvic CT. FINDINGS: Lower chest: pulmonary metastasis, suboptimally evaluated. Normal heart size without pericardial or pleural effusion. Hepatobiliary: Extensive large volume hepatic metastatic burden. Index dominant right hepatic lobe lesion measures 12.5 x 8.3 cm on image 34/series 11002. Compare 11.6 x 9.2 cm on the prior. Index posterior left hepatic lobe lesion measures 3.9 x 2.3 cm on image 39/ series 11002. Compare 3.1 x 2.2 cm on the prior exam (when remeasured). Gallstones. Mild gallbladder distention without specific evidence of acute cholecystitis. Intrahepatic duct dilatation is  mild in the right hepatic lobe. Example image 28/series 11002 and image 24/series 9. This is either new or increased . Also more conspicuous secondary to better soft tissue contrast of MRI. The common duct is normal in caliber, without choledocholithiasis or obstructive process. Pancreas:  Normal, without mass or ductal dilatation. Spleen:  Normal in size, without focal abnormality. Adrenals/Urinary Tract: Normal left adrenal gland. Right adrenal metastasis measures 2.5 cm on image 53/ series 11002. Compare 2.1 cm on the prior exam (when remeasured). Mild renal cortical thinning bilaterally. No hydronephrosis. Stomach/Bowel: Normal caliber the stomach. No small bowel obstruction. Hepatic flexure colonic mass with direct pericolonic extension, better evaluated on prior CT. Vascular/Lymphatic: Normal caliber of the aorta and branch vessels. Extensive mass-effect upon the portal vein, including image 54/ series 11002. No portal vein thrombus. Extensive abdominal adenopathy. Example mass in  the porta hepatis measures 5.9 x 3.3 cm on image 50/ series 11002. Compare 5.4 x 3.3 cm on the prior exam. A left retroperitoneal node measures 2.0 x 1.6 cm on image 55/series 11002. Compare 1.8 x 1.6 cm on the prior exam (when remeasured). Other:  Small volume ascites is increased. Musculoskeletal: S-shaped thoracolumbar spine curvature. IMPRESSION: 1. Increase in large volume hepatic metastatic burden. 2. New or increased mild right intrahepatic biliary duct dilatation, likely due to mass effect from the dominant right-sided mass. No common duct dilatation. 3. Abdominal adenopathy, mildly progressive. 4. Cholelithiasis. 5. Increase in small volume ascites. 6. Pulmonary metastasis. 7. Progressive right adrenal metastasis. Electronically Signed   By: Abigail Miyamoto M.D.   On: 09/25/2016 20:06        Scheduled Meds: . amLODipine  5 mg Oral Daily  . feeding supplement (ENSURE ENLIVE)  237 mL Oral BID BM  . ferrous sulfate  325 mg Oral TID WC  . [START ON 09/28/2016] lactulose  20 g Oral Daily  . sodium chloride flush  3 mL Intravenous Q12H   Continuous Infusions: . sodium chloride 75 mL/hr at 09/27/16 0556     LOS: 1 day    Time spent: 35 minutes.     Elmarie Shiley, MD [facility] Pager [phone]  If 7PM-7AM, please contact night-coverage [email or web address] Password [email or web address] 09/27/2016, 9:00 AM

## 2016-09-28 DIAGNOSIS — D630 Anemia in neoplastic disease: Principal | ICD-10-CM

## 2016-09-28 DIAGNOSIS — Z7189 Other specified counseling: Secondary | ICD-10-CM

## 2016-09-28 DIAGNOSIS — I10 Essential (primary) hypertension: Secondary | ICD-10-CM

## 2016-09-28 DIAGNOSIS — C180 Malignant neoplasm of cecum: Secondary | ICD-10-CM

## 2016-09-28 DIAGNOSIS — N189 Chronic kidney disease, unspecified: Secondary | ICD-10-CM

## 2016-09-28 DIAGNOSIS — C787 Secondary malignant neoplasm of liver and intrahepatic bile duct: Secondary | ICD-10-CM

## 2016-09-28 LAB — COMPREHENSIVE METABOLIC PANEL
ALT: 112 U/L — ABNORMAL HIGH (ref 14–54)
ANION GAP: 6 (ref 5–15)
AST: 168 U/L — ABNORMAL HIGH (ref 15–41)
Albumin: 1.9 g/dL — ABNORMAL LOW (ref 3.5–5.0)
Alkaline Phosphatase: 789 U/L — ABNORMAL HIGH (ref 38–126)
BUN: 16 mg/dL (ref 6–20)
CHLORIDE: 109 mmol/L (ref 101–111)
CO2: 19 mmol/L — ABNORMAL LOW (ref 22–32)
Calcium: 8.5 mg/dL — ABNORMAL LOW (ref 8.9–10.3)
Creatinine, Ser: 0.75 mg/dL (ref 0.44–1.00)
Glucose, Bld: 106 mg/dL — ABNORMAL HIGH (ref 65–99)
POTASSIUM: 3.6 mmol/L (ref 3.5–5.1)
Sodium: 134 mmol/L — ABNORMAL LOW (ref 135–145)
Total Bilirubin: 11.8 mg/dL — ABNORMAL HIGH (ref 0.3–1.2)
Total Protein: 5.9 g/dL — ABNORMAL LOW (ref 6.5–8.1)

## 2016-09-28 LAB — CBC
HCT: 27.7 % — ABNORMAL LOW (ref 36.0–46.0)
Hemoglobin: 8.8 g/dL — ABNORMAL LOW (ref 12.0–15.0)
MCH: 24.9 pg — AB (ref 26.0–34.0)
MCHC: 31.8 g/dL (ref 30.0–36.0)
MCV: 78.5 fL (ref 78.0–100.0)
PLATELETS: 436 10*3/uL — AB (ref 150–400)
RBC: 3.53 MIL/uL — ABNORMAL LOW (ref 3.87–5.11)
RDW: 20.5 % — AB (ref 11.5–15.5)
WBC: 14.7 10*3/uL — AB (ref 4.0–10.5)

## 2016-09-28 NOTE — Progress Notes (Signed)
Chaplain stopped in to visit with patient while rounding the floor.  Patient talks about being excited to eat since she had been on a liquid diet.  Patient's food had just arrived.  Patient also introduced me to the family that was in the room, her brother and his spouse.  Patient also shared with Chaplain that her son had just left the room.    Patient expressed her appreciation for my coming by.  She desire to eat her meal and told Chaplain that she may be on the 3rd floor beginning tomorrow.    Patient has a relatively new diagnosis and will begin chemo as soon as tomorrow.  Chaplain will do a follow up visit with patient.    09/28/16 1456  Clinical Encounter Type  Visited With Patient;Family  Visit Type Initial;Spiritual support   Lisa Guerra Chaplain Resident

## 2016-09-28 NOTE — Progress Notes (Signed)
PROGRESS NOTE    Lisa Guerra  [id number] DOB: 1946-01-22 DOA: 09/24/2016 PCP: [name], MD   Brief Narrative: 71 y.o.year old female with a history of anemia, hypertension, who   recently was diagnosed with metastatic colon cancer after colonoscopy Sep 07, 2016 by [facility], revealing a large mass at the cecum (adenocarcinoma)  . Staging with CT chest/abd/pelvis revealed numerous small pulmonary nodules consistent with metastases, extensive liver metastases.She was seen by Dr. Benay Spice with Oncology on 2/19 and felt to have synchronous right and left colon primaries. Liver biopsy arranged for 3/1 with plans for systemic therapy. She presented with complaints of pain under her right breast that started  several days ago associated with a dry cough. She denied shortness of breath. Patient was also scheduled for an ultrasound-guided biopsy today which was canceled because of anemia. Patient was asked to first get transfused, and reschedule biopsy for later. She denies any hematochezia or melena. Patient is being admitted for transfusion, prior to procedure by IR She was recently hospitalized at [facility] and discharged on 2/27, for chest pain. CT of the chest on 09/20/16 did not show any pulmonary embolus, multiple pulmonary nodules consistent with metastatic disease. Cardiac enzymes were found to be negative during that admission, EKG was unremarkable. Hemoglobin was 7.7 on 2/27 prior to discharge.    Assessment & Plan:   Active Problems:   Colon cancer ([place])   Anemia   Essential hypertension   Metastatic cancer ([facility])   Anemia of chronic disease   Protein-calorie malnutrition, severe   Iron deficiency anemia due to chronic blood loss  1-Anemia of chronic disease likely secondary to underlying malignancy Received 2 units of PRBC.  Hb 9.6---8.5---stable at 8.5.  2-Transaminases, hyperbilirubinemia; progressively worse LFT.  -MRCP; Increase in large volume hepatic metastatic burden.  New or increased mild right intrahepatic biliary duct dilatation, likely due to mass effect from the dominant right-sided mass. No common duct dilatation. -Discussed results of MRCP with Dr Paulita Fujita; he think transaminases is mostly primary related to tumor borden  and there is probably not significant obstructive component. He does not recommend ERCP or percutaneous drainage.  -IR Does not recommend percutaneous drainage.  -Started on lactulose.    3-Large mass involving hepatic flexure;  Underwent Biopsy 3-2, positive for metastatic adenocarcinoma.   4-Metastatic colon cancer with metastases to liver and lungs. Patient is followed by oncology in [place], Dr. Ammie Dalton. Oncology will see patient in consultation.  Plan to start chemotherapy 3-6.  5-chest pain; suspect related to low hb . Resolved.   6-Failure to thrive;  Patient not eating enough. She report early satiety. Will see how she does with full liquid diet. Will continue with IV fluids.   7-Leukocytosis; no fever. Levels fluctuates.   DVT prophylaxis: scd.  Code Status: full code.  Family Communication: none at bedside.  Disposition Plan: plan to start chemotherapy 3-06. Patient to be transfer to 3rd floor. Dr Learta Codding will take patient on his service starting 3-06. [facility] will sign off starting 3-06. Please call us as needed.   Consultants:   Oncology   GI    Procedures: liver biopsy 3-2   Antimicrobials: none   Subjective: Denies abdominal pain. She is alert, oriented.  Having BM     Objective: Vitals:   09/27/16 1410 09/27/16 2120 09/28/16 0449 09/28/16 1001  BP: 128/84 (!) 142/94 (!) 141/88 135/89  Pulse: 96 86 86   Resp: 18 18 18    Temp: 97.8 F (36.6 C) 98.8 F (  37.1 C) 98.5 F (36.9 C)   TempSrc: Oral Oral Oral   SpO2: 100% 99% 99%   Weight:      Height:        Intake/Output Summary (Last 24 hours) at 09/28/16 1409 Last data filed at 09/28/16 0900  Gross per 24 hour  Intake              1835 ml  Output                0 ml  Net             1835 ml   Filed Weights   09/24/16 1300  Weight: 46.4 kg (102 lb 6.4 oz)    Examination:  General exam: Appears calm and comfortable, thin appearing, icteric  Respiratory system: Clear to auscultation. Respiratory effort normal. Cardiovascular system: S1 & S2 heard, RRR. No JVD, murmurs, rubs, gallops or clicks. No pedal edema. Gastrointestinal system: Abdomen is nondistended, soft and mild tender. Normal bowel sounds heard. Central nervous system: Alert and oriented. No focal neurological deficits. Extremities: Symmetric 5 x 5 power. Skin: No rashes, lesions or ulcers    Data Reviewed: I have personally reviewed following labs and imaging studies  CBC:  Recent Labs Lab 09/24/16 0705 09/25/16 0518 09/26/16 0426 09/27/16 0622 09/28/16 0811  WBC 15.8* 17.5* 12.9* 14.0* 14.7*  HGB 6.6* 9.6* 8.5* 8.5* 8.8*  HCT 21.5* 29.8* 26.3* 27.0* 27.7*  MCV 75.4* 74.7* 76.9* 77.4* 78.5  PLT 565* 477* 423* 435* [id number]   Basic Metabolic Panel:  Recent Labs Lab 09/24/16 0705 09/24/16 1007 09/25/16 0518 09/26/16 0426 09/27/16 0622 09/28/16 0811  NA 136  --  137 136 135 134*  K 4.1  --  4.1 3.9 3.9 3.6  CL 105  --  107 110 108 109  CO2 21*  --  22 20* 19* 19*  GLUCOSE 88  --  101* 73 72 106*  BUN 30*  --  26* 20 19 16   CREATININE 1.19*  --  1.14* 0.92 0.78 0.75  CALCIUM 8.7*  --  8.5* 8.3* 8.3* 8.5*  MG  --  2.0  --   --   --   --    GFR: Estimated Creatinine Clearance: 49.3 mL/min (by C-G formula based on SCr of 0.75 mg/dL). Liver Function Tests:  Recent Labs Lab 09/24/16 0705 09/25/16 0518 09/26/16 0426 09/27/16 0622 09/28/16 0811  AST 164* 148* 137* 144* 168*  ALT 171* 112* 88* 93* 112*  ALKPHOS 639* 678* 632* 673* 789*  BILITOT 5.5* 6.9* 7.9* 9.5* 11.8*  PROT 6.8 6.7 5.9* 5.8* 5.9*  ALBUMIN 2.5* 2.3* 1.9* 1.9* 1.9*   No results for input(s): LIPASE, AMYLASE in the last 168 hours.  Recent Labs Lab  09/25/16 1613  AMMONIA 30   Coagulation Profile:  Recent Labs Lab 09/24/16 1007  INR 1.06   Cardiac Enzymes:  Recent Labs Lab 09/21/16 1915 09/22/16 0017  TROPONINI <0.03 <0.03   BNP (last 3 results) No results for input(s): PROBNP in the last 8760 hours. HbA1C: No results for input(s): HGBA1C in the last 72 hours. CBG: No results for input(s): GLUCAP in the last 168 hours. Lipid Profile: No results for input(s): CHOL, HDL, LDLCALC, TRIG, CHOLHDL, LDLDIRECT in the last 72 hours. Thyroid Function Tests: No results for input(s): TSH, T4TOTAL, FREET4, T3FREE, THYROIDAB in the last 72 hours. Anemia Panel: No results for input(s): VITAMINB12, FOLATE, FERRITIN, TIBC, IRON, RETICCTPCT in the last 72 hours. Sepsis Labs: No  results for input(s): PROCALCITON, LATICACIDVEN in the last 168 hours.  No results found for this or any previous visit (from the past 240 hour(s)).       Radiology Studies: No results found.      Scheduled Meds: . amLODipine  5 mg Oral Daily  . feeding supplement (ENSURE ENLIVE)  237 mL Oral BID BM  . ferrous sulfate  325 mg Oral TID WC  . lactulose  20 g Oral Daily  . sodium chloride flush  3 mL Intravenous Q12H   Continuous Infusions: . sodium chloride 75 mL/hr at 09/28/16 1002     LOS: 2 days    Time spent: 35 minutes.     Elmarie Shiley, MD [facility] Pager [phone]  If 7PM-7AM, please contact night-coverage [email or web address] Password [email or web address] 09/28/2016, 2:09 PM

## 2016-09-28 NOTE — Progress Notes (Signed)
IP PROGRESS NOTE  Subjective:   Lisa Guerra was diagnosed with metastatic colon cancer last month. I saw her in consultation and arrange for follow-up in [place] to initiate systemic therapy. She decided against the [place] appointment and would like to have her care in [place]. She was admitted 09/21/2016 to [facility] with chest pain. She was again admitted 09/24/2016 with right-sided chest discomfort. She was found to have severe microcytic anemia. She was transfused with red blood cells 09/24/2016. She underwent placement of a Port-A-Cath and biopsy of a liver lesion on 09/25/2016. The pathology from the liver biopsy confirmed metastatic colon cancer.  Ms. Striano was noted to have marked hyperbilirubinemia on hospital admission. This has persisted. An MRI of the abdomen revealed mild right intrahepatic biliary ductal dilatation with no common bile duct dilatation. She was evaluated by gastroenterology and interventional radiology and there is no lesion amenable to a palliative stent 3  She reports feeling stronger compared to hospital admission. She has discussed chemotherapy over the weekend with Drs.Ennever and [employer].    Objective: Vital signs in last 24 hours: Blood pressure 137/83, pulse 83, temperature 97.5 F (36.4 C), temperature source Oral, resp. rate 18, height 5\' 6"  (1.676 m), weight 102 lb 6.4 oz (46.4 kg), SpO2 100 %.  Intake/Output from previous day: 03/04 0701 - 03/05 0700 In: 1955 [P.O.:220; I.V.:1735] Out: -   Physical Exam:  HEENT: Scleral icterus  Lungs: Clear bilaterally  Cardiac: Regular rate and rhythmen:  Abdomen: Soft, fullness in the right upper abdomen without a discrete liver edge, nontender  Extremities: No leg edema   Portacath/PICC-without erythema  Lab Results:  Recent Labs  09/27/16 0622 09/28/16 0811  WBC 14.0* 14.7*  HGB 8.5* 8.8*  HCT 27.0* 27.7*  PLT 435* 436*    BMET  Recent Labs  09/27/16 0622  09/28/16 0811  NA 135 134*  K 3.9 3.6  CL 108 109  CO2 19* 19*  GLUCOSE 72 106*  BUN 19 16  CREATININE 0.78 0.75  CALCIUM 8.3* 8.5*    Studies/Results: No results found.  Medications: I have reviewed the patient's current medications.  Assessment/Plan:  1. Metastatic colon cancer-adenocarcinoma on biopsy of a cecal mass 09/07/2016  Separate polypoid mass at the sigmoid colon, biopsy to 15 2018 revealed a tubular adenoma with at least high-grade dysplasia  Staging CTs of the chest, abdomen, and pelvis 09/08/2016-extensive liver and lung metastases, bulky abdominal/retroperitoneal lymphadenopathy  Daryel Gerald elevated CEA  Ultrasound-guided biopsy of a liver lesion 09/25/2016 confirmed metastatic colon cancer  2.  Iron deficiency anemia secondary to #1  Red cell transfusion 09/24/2016    IV iron 09/27/2016  3.  Chronic renal insufficiency  4.   Polyps noted on the colonoscopy 09/07/2016 including a polyp with high-grade dysplasia  5.   Hypertension  6.   Family history of multiple cancers including pancreas cancer  7.   Lipidemia  8.   Hyperbilirubinemia secondary to extensive metastatic disease involving the liver  Ms. Matusik has been diagnosed with metastatic colon cancer. I discussed the prognosis and treatment options with Ms. Nicklaus and her family. She understands no therapy will be curative. I estimate her survival to be weeks or a few months in the absence of systemic therapy.  I discussed the expected response rate with systemic chemotherapy.  She has marked hyperbilirubinemia secondary to the intrahepatic tumor burden. The hyperbilirubinemia does not appear amenable to stenting. She will be at increased risk for toxicity with chemotherapy.  I recommend  treatment with FOLFOX. We discussed a trial of FOLFOX versus comfort care. She would like to proceed with chemotherapy.  We reviewed the potential toxicities of the FOLFOX regimen including the chance for  nausea/vomiting, mucositis, diarrhea, alopecia, and hematologic toxicity. We discussed the hyperpigmentation, rash, sun sensitivity, and hand/foot syndrome associated with 5-fluorouracil. We discussed the allergic reaction and various types of neuropathy seen with oxaliplatin. She understands he increased risk of many of these toxicity secondary to the liver dysfunction.  I discussed the case with Dr. Tyrell Antonio. Ms. Seegert will be transferred to the inpatient oncology unit today with the plan to initiate systemic chemotherapy on 09/29/2016.  She will be transferred to the medical oncology service beginning 09/29/2016.  I entered a chemotherapy plan today.  LOS: 2 days   Betsy Coder, MD   09/28/2016, 3:34 PM

## 2016-09-29 ENCOUNTER — Inpatient Hospital Stay (HOSPITAL_COMMUNITY): Payer: Medicare Other

## 2016-09-29 MED ORDER — SODIUM CHLORIDE 0.9% FLUSH
10.0000 mL | INTRAVENOUS | Status: DC | PRN
Start: 2016-09-29 — End: 2016-10-01

## 2016-09-29 MED ORDER — IOPAMIDOL (ISOVUE-300) INJECTION 61%
75.0000 mL | Freq: Once | INTRAVENOUS | Status: AC | PRN
  Administered 2016-09-29: 75 mL via INTRAVENOUS

## 2016-09-29 MED ORDER — DEXAMETHASONE SODIUM PHOSPHATE 100 MG/10ML IJ SOLN
10.0000 mg | Freq: Once | INTRAMUSCULAR | Status: AC
  Administered 2016-09-29: 10 mg via INTRAVENOUS
  Filled 2016-09-29: qty 1

## 2016-09-29 MED ORDER — HOT PACK MISC ONCOLOGY: 1.0000 | Freq: Once | Status: AC | PRN

## 2016-09-29 MED ORDER — HEPARIN SOD (PORK) LOCK FLUSH 100 UNIT/ML IV SOLN: 250.0000 [IU] | Freq: Once | INTRAVENOUS | Status: DC | PRN

## 2016-09-29 MED ORDER — OXALIPLATIN CHEMO INJECTION 100 MG/20ML
65.0000 mg/m2 | Freq: Once | INTRAVENOUS | Status: AC
  Administered 2016-09-29: 95 mg via INTRAVENOUS
  Filled 2016-09-29: qty 19

## 2016-09-29 MED ORDER — FLUOROURACIL CHEMO INJECTION 5 GM/100ML
1200.0000 mg/m2 | Freq: Once | INTRAVENOUS | Status: AC
  Administered 2016-09-29: 1750 mg via INTRAVENOUS
  Filled 2016-09-29: qty 35

## 2016-09-29 MED ORDER — DEXTROSE 5 % IV SOLN
Freq: Once | INTRAVENOUS | Status: AC
  Administered 2016-09-29: 15:00:00 via INTRAVENOUS

## 2016-09-29 MED ORDER — IOPAMIDOL (ISOVUE-300) INJECTION 61%
INTRAVENOUS | Status: AC
  Filled 2016-09-29: qty 75

## 2016-09-29 MED ORDER — PALONOSETRON HCL INJECTION 0.25 MG/5ML
0.2500 mg | Freq: Once | INTRAVENOUS | Status: AC
  Administered 2016-09-29: 0.25 mg via INTRAVENOUS
  Filled 2016-09-29: qty 5

## 2016-09-29 MED ORDER — COLD PACK MISC ONCOLOGY: 1.0000 | Freq: Once | Status: AC | PRN

## 2016-09-29 MED ORDER — ALTEPLASE 2 MG IJ SOLR: 2.0000 mg | Freq: Once | INTRAMUSCULAR | Status: DC | PRN

## 2016-09-29 MED ORDER — HEPARIN SOD (PORK) LOCK FLUSH 100 UNIT/ML IV SOLN
500.0000 [IU] | Freq: Once | INTRAVENOUS | Status: DC | PRN
  Filled 2016-09-29: qty 5

## 2016-09-29 MED ORDER — SODIUM CHLORIDE 0.9% FLUSH
3.0000 mL | INTRAVENOUS | Status: DC | PRN
Start: 2016-09-29 — End: 2016-10-01

## 2016-09-29 NOTE — Progress Notes (Signed)
IP PROGRESS NOTE  Subjective:   Lisa Guerra has no complaint this morning. She has an improved energy level compared to hospital admission. No pain.  Objective: Vital signs in last 24 hours: Blood pressure (!) 131/93, pulse 94, temperature 98.6 F (37 C), temperature source Oral, resp. rate 14, height 5\' 6"  (1.676 m), weight 102 lb 6.4 oz (46.4 kg), SpO2 100 %.  Intake/Output from previous day: 03/05 0701 - 03/06 0700 In: 990 [P.O.:240; I.V.:750] Out: 0   Physical Exam:  HEENT: Scleral icterus  Lungs: Clear bilaterally  Cardiac: Regular rate and rhythmen:  Abdomen: Soft, fullness in the right upper abdomen without a discrete liver edge, nontender  Extremities: No leg edema   Portacath/PICC-without erythema  Lab Results:  Recent Labs  09/27/16 0622 09/28/16 0811  WBC 14.0* 14.7*  HGB 8.5* 8.8*  HCT 27.0* 27.7*  PLT 435* 436*    BMET  Recent Labs  09/27/16 0622 09/28/16 0811  NA 135 134*  K 3.9 3.6  CL 108 109  CO2 19* 19*  GLUCOSE 72 106*  BUN 19 16  CREATININE 0.78 0.75  CALCIUM 8.3* 8.5*  Alkaline phosphatase 789, bilirubin 11.8  Studies/Results: No results found.  Medications: I have reviewed the patient's current medications.  Assessment/Plan:  1. Metastatic colon cancer-adenocarcinoma on biopsy of a cecal mass 09/07/2016  Separate polypoid mass at the sigmoid colon, biopsy to 15 2018 revealed a tubular adenoma with at least high-grade dysplasia  Staging CTs of the chest, abdomen, and pelvis 09/08/2016-extensive liver and lung metastases, bulky abdominal/retroperitoneal lymphadenopathy  Daryel Gerald elevated CEA  Ultrasound-guided biopsy of a liver lesion 09/25/2016 confirmed metastatic colon cancer  2.  Iron deficiency anemia secondary to #1  Red cell transfusion 09/24/2016    IV iron 09/27/2016  3.  Chronic renal insufficiency  4.   Polyps noted on the colonoscopy 09/07/2016 including a polyp with high-grade dysplasia  5.    Hypertension  6.   Family history of multiple cancers including pancreas cancer  7.   Lipidemia  8.   Hyperbilirubinemia secondary to extensive metastatic disease involving the liver  Lisa Guerra appears stable. The plan is to begin FOLFOX chemotherapy today. We again reviewed the potential toxicities associated with the FOLFOX regimen. She understands the increased risk of toxicity in her case secondary to the hyperbilirubinemia. She agrees to proceed.  I reviewed the imaging studies with a radiologist. The steady rise in the alkaline phosphatase and bilirubin is suggestive of an obstructive process. The bile ducts may have been in an early stage of obstruction when the MRI was performed on on 09/25/2016.  We will obtain a stat CT abdomen to evaluate the bile ducts prior to initiating chemotherapy today.  LOS: 3 days   Betsy Coder, MD   09/29/2016, 9:23 AM

## 2016-09-29 NOTE — Progress Notes (Signed)
Dosages and dilutions for Oxaliplatin and Flouraurricil verified with Lottie Dawson, RN.

## 2016-09-29 NOTE — Progress Notes (Signed)
Chemotherapy consent for Fluorouracil, Oxaliplatin and leucovorin.

## 2016-09-29 NOTE — Progress Notes (Signed)
Proceed with chemo today despite labs per Dr. Benay Spice.  Bili > 1.5  Dolly Rias RPh 09/29/2016, 2:25 PM Pager [phone]

## 2016-09-30 ENCOUNTER — Ambulatory Visit (HOSPITAL_COMMUNITY): Payer: Medicare Other | Admitting: Oncology

## 2016-09-30 ENCOUNTER — Ambulatory Visit (HOSPITAL_COMMUNITY): Payer: Medicare Other

## 2016-09-30 NOTE — Progress Notes (Signed)
IP PROGRESS NOTE  Subjective:   Ms. Kesinger began FOLFOX chemotherapy yesterday. No complaint. No nausea or cold sensitivity. She noted blood in a bowel movement.  Objective: Vital signs in last 24 hours: Blood pressure (!) 154/99, pulse 72, temperature 97.5 F (36.4 C), temperature source Oral, resp. rate 17, height 5\' 6"  (1.676 m), weight 102 lb 6.4 oz (46.4 kg), SpO2 100 %.  Intake/Output from previous day: 03/06 0701 - 03/07 0700 In: 720 [P.O.:720] Out: 1200 [Urine:1200]  Physical Exam:  HEENT: Scleral icterus  Lungs: Clear bilaterally  Cardiac: Regular rate and rhythm  Abdomen: Soft, fullness in the right upper abdomen without a discrete liver edge, nontender  Extremities: No leg edema   Portacath/PICC-without erythema  Lab Results:  Recent Labs  09/28/16 0811  WBC 14.7*  HGB 8.8*  HCT 27.7*  PLT 436*    BMET  Recent Labs  09/28/16 0811  NA 134*  K 3.6  CL 109  CO2 19*  GLUCOSE 106*  BUN 16  CREATININE 0.75  CALCIUM 8.5*  Alkaline phosphatase 789, bilirubin 11.8  Studies/Results: Ct Abdomen W Contrast  Result Date: 09/29/2016 CLINICAL DATA:  Right upper quadrant abdominal pain. Known metastatic colon cancer. EXAM: CT ABDOMEN WITH CONTRAST TECHNIQUE: Multidetector CT imaging of the abdomen was performed using the standard protocol following bolus administration of intravenous contrast. CONTRAST:  74mL ISOVUE-300 IOPAMIDOL (ISOVUE-300) INJECTION 61% COMPARISON:  MRI 09/25/2016 FINDINGS: Lower chest: Pulmonary metastatic nodules are again demonstrated along with a right lower lobe calcified granuloma. The heart is normal in size. No pericardial effusion. There is a small right pleural effusion with overlying atelectasis. A right-sided Port-A-Cath is noted. Hepatobiliary: Diffuse hepatic metastatic disease as demonstrated on the recent MRI examination. Large confluent mass occupying the right hepatic lobe and measuring 10.5 x 9.0 cm. Mild stable intrahepatic  biliary dilatation likely due to bulky portal adenopathy. No common bile duct dilatation. The gallbladder is filled with high attenuation material which could be tumor, sludge or hemorrhage. Pancreas: No mass, inflammation or ductal dilatation. Mild mass effect on the pancreatic body by a bulky celiac axis adenopathy. Spleen: Normal size.  No focal lesions. Adrenals/Urinary Tract: Stable large right adrenal gland metastasis. The kidneys are grossly normal in stable. Stomach/Bowel: The stomach, duodenum and visualized small bowel are unremarkable. Stable large hepatic flexure colon mass measuring 5.7 x 4.2 cm. Vascular/Lymphatic: The aorta and branch vessels are patent. The major venous structures are patent. Significant compression of the middle and right portal veins due to the large metastatic lesions. Extensive bulky gastrohepatic ligament, portal, celiac axis, mesenteric and retroperitoneal adenopathy. Other: Small amount of ascites. Musculoskeletal: No significant bony findings. IMPRESSION: 1. Large hepatic flexure colon mass with diffuse hepatic metastasis, bulky abdominal lymphadenopathy, right adrenal gland metastasis and lung metastasis. 2. Mild gallbladder distention. The gallbladder is filled high attenuation material which could be sludge, hemorrhage or tumor. 3. Stable mild intrahepatic biliary dilatation likely due to bulky portal adenopathy. Electronically Signed   By: Marijo Sanes M.D.   On: 09/29/2016 12:32    Medications: I have reviewed the patient's current medications.  Assessment/Plan:  1. Metastatic colon cancer-adenocarcinoma on biopsy of a cecal mass 09/07/2016  Separate polypoid mass at the sigmoid colon, biopsy to 15 2018 revealed a tubular adenoma with at least high-grade dysplasia  Staging CTs of the chest, abdomen, and pelvis 09/08/2016-extensive liver and lung metastases, bulky abdominal/retroperitoneal lymphadenopathy  Daryel Gerald elevated CEA  Ultrasound-guided biopsy of  a liver lesion 09/25/2016 confirmed metastatic colon cancer  Cycle 1 FOLFOX 09/29/2016  2.  Iron deficiency anemia secondary to #1  Red cell transfusion 09/24/2016    IV iron 09/27/2016  3.  Chronic renal insufficiency  4.   Polyps noted on the colonoscopy 09/07/2016 including a polyp with high-grade dysplasia  5.   Hypertension  6.   Family history of multiple cancers including pancreas cancer  7.   Lipidemia  8.   Hyperbilirubinemia secondary to extensive metastatic disease involving the liver  CT abdomen 09/29/2016 with stable mild intrahepatic biliary dilatation.  Ms. Lisa Guerra appears stable. No apparent acute toxicity from the chemotherapy initiated 09/29/2016. I discussed the case with interventional radiology and gastroenterology yesterday. There is no biliary lesion amenable to IR drainage and GI does not recommend an ERCP. We decided to proceed with chemotherapy.  We will check a CBC and chemistry panel 10/01/2016. The plan is for discharge to home at the completion of chemotherapy on 10/01/2016.   LOS: 4 days   Betsy Coder, MD   09/30/2016, 8:51 AM

## 2016-10-01 ENCOUNTER — Other Ambulatory Visit: Payer: Self-pay | Admitting: *Deleted

## 2016-10-01 DIAGNOSIS — D500 Iron deficiency anemia secondary to blood loss (chronic): Secondary | ICD-10-CM

## 2016-10-01 DIAGNOSIS — C189 Malignant neoplasm of colon, unspecified: Secondary | ICD-10-CM

## 2016-10-01 LAB — COMPREHENSIVE METABOLIC PANEL
ALBUMIN: 1.8 g/dL — AB (ref 3.5–5.0)
ALT: 122 U/L — ABNORMAL HIGH (ref 14–54)
AST: 172 U/L — ABNORMAL HIGH (ref 15–41)
Alkaline Phosphatase: 794 U/L — ABNORMAL HIGH (ref 38–126)
Anion gap: 7 (ref 5–15)
BUN: 16 mg/dL (ref 6–20)
CHLORIDE: 104 mmol/L (ref 101–111)
CO2: 19 mmol/L — ABNORMAL LOW (ref 22–32)
Calcium: 7.9 mg/dL — ABNORMAL LOW (ref 8.9–10.3)
Creatinine, Ser: 0.66 mg/dL (ref 0.44–1.00)
GFR calc Af Amer: >60 mL/min (ref >60)
GFR calc non Af Amer: >60 mL/min (ref >60)
GLUCOSE: 159 mg/dL — AB (ref 65–99)
POTASSIUM: 3.6 mmol/L (ref 3.5–5.1)
SODIUM: 130 mmol/L — AB (ref 135–145)
Total Bilirubin: 13.5 mg/dL — ABNORMAL HIGH (ref 0.3–1.2)
Total Protein: 5.7 g/dL — ABNORMAL LOW (ref 6.5–8.1)

## 2016-10-01 LAB — CBC WITH DIFFERENTIAL/PLATELET
BASOS ABS: 0 10*3/uL (ref 0.0–0.1)
BASOS PCT: 0 %
EOS PCT: 0 %
Eosinophils Absolute: 0 10*3/uL (ref 0.0–0.7)
HCT: 26.3 % — ABNORMAL LOW (ref 36.0–46.0)
Hemoglobin: 8.2 g/dL — ABNORMAL LOW (ref 12.0–15.0)
Lymphocytes Relative: 6 %
Lymphs Abs: 0.9 10*3/uL (ref 0.7–4.0)
MCH: 24.3 pg — ABNORMAL LOW (ref 26.0–34.0)
MCHC: 31.2 g/dL (ref 30.0–36.0)
MCV: 77.8 fL — ABNORMAL LOW (ref 78.0–100.0)
Monocytes Absolute: 0.7 10*3/uL (ref 0.1–1.0)
Monocytes Relative: 5 %
NEUTROS ABS: 12.8 10*3/uL — AB (ref 1.7–7.7)
Neutrophils Relative %: 89 %
PLATELETS: 415 10*3/uL — AB (ref 150–400)
RBC: 3.38 MIL/uL — ABNORMAL LOW (ref 3.87–5.11)
RDW: 20.6 % — ABNORMAL HIGH (ref 11.5–15.5)
WBC: 14.4 10*3/uL — ABNORMAL HIGH (ref 4.0–10.5)

## 2016-10-01 MED ORDER — ONDANSETRON HCL 4 MG PO TABS: 4.0000 mg | ORAL_TABLET | Freq: Three times a day (TID) | ORAL | 1 refills | Status: DC | PRN

## 2016-10-01 MED ORDER — OXYCODONE HCL 5 MG PO TABS: 5.0000 mg | ORAL_TABLET | ORAL | 0 refills | Status: DC | PRN

## 2016-10-01 MED ORDER — POLYETHYLENE GLYCOL 3350 17 G PO PACK: 17.0000 g | PACK | Freq: Every day | ORAL | 0 refills | Status: DC | PRN

## 2016-10-01 NOTE — Plan of Care (Signed)
Problem: Education: Goal: Knowledge of [facility] Education information/materials will improve Outcome: Progressing Patient with emesis x1, zofran given, receiving chemo, resting during the shift, no other events will continue to monitor .

## 2016-10-01 NOTE — Discharge Instructions (Signed)
Call for fever, shortness of breath, bleeding, or mouth sores

## 2016-10-01 NOTE — Discharge Summary (Signed)
Physician Discharge Summary  Patient ID: Lisa Guerra @ATTENDINGNPI @ MRN: [id number] DOB/AGE: 1946/06/12 71 y.o.  Admit date: 09/24/2016 Discharge date: 10/01/2016  Discharge Diagnoses:  Active Problems:   Colon cancer ([place]), Status post cycle 1 FOLFOX initiated 09/29/2016   Anemia   Essential hypertension   Hyperbilirubinemia secondary to metastatic colon cancer involving the liver   Anemia of chronic disease   Protein-calorie malnutrition, severe   Iron deficiency anemia due to chronic blood loss   Goals of care, counseling/discussion   Discharged Condition: Stable  Discharge Labs:  10/01/2016-creatinine 0.66, alkaline phosphatase of 94, bilirubin 13.5, AST 172, ALT 122, hemolytic 0.2  Significant Diagnostic Studies: MRI abdomen/MRCP, CT abdomen/pelvis  Consults: Gastroenterology, interventional radiology  Procedures:  Port-A-Cath placement, liver biopsy, infusional chemotherapy, red blood cell transfusion  Disposition: 01-Home or Self Care   Allergies as of 10/01/2016   No Known Allergies     Medication List    STOP taking these medications   aspirin EC 81 MG tablet   atorvastatin 10 MG tablet Commonly known as:  LIPITOR   lisinopril-hydrochlorothiazide 20-12.5 MG tablet Commonly known as:  PRINZIDE,ZESTORETIC     TAKE these medications   amLODipine 5 MG tablet Commonly known as:  NORVASC Take 5 mg by mouth daily.   ferrous sulfate 325 (65 FE) MG tablet Take 1 tablet (325 mg total) by mouth 3 (three) times daily with meals.   ondansetron 4 MG tablet Commonly known as:  ZOFRAN Take 1 tablet (4 mg total) by mouth every 8 (eight) hours as needed for nausea.   oxyCODONE 5 MG immediate release tablet Commonly known as:  Oxy IR/ROXICODONE Take 1 tablet (5 mg total) by mouth every 4 (four) hours as needed for moderate pain.   polyethylene glycol packet Commonly known as:  MIRALAX / GLYCOLAX Take 17 g by mouth daily as needed for mild constipation.        Follow-up Information    Betsy Coder, MD Follow up.   Specialty:  Oncology Why:  Office visit will be scheduled for 10/06/2016 Contact information: [address] [place] [address] [phone]           Hospital Course: Lisa Guerra was recently diagnosed with metastatic colon cancer. The initial plan was for her to receive treatment in [place]. She decided to transition her care to [facility]. She was admitted with right upper abdomen pain and symptomatic anemia on 09/24/2016. She was transfused with packed red blood cells after the admission hemoglobin returned at 6.6 with an MCV of 75.4. He received a dose of IV iron.  The bilirubin and alkaline phosphatase were markedly elevated on hospital admission. An MRI/MRCP on 09/25/2016. Mild right intrahepatic biliary ductal dilatation. No common duct dilatation. A CT on 09/29/2016 revealed similar findings.  Gastroenterology and interventional radiology were consulted. They did not recommend an attempt at stent placement. I discussed the case with interventional radiology and gastroenterology. An ERCP was not recommended. They felt there was no stricture amenable to stenting. It is felt the liver tumor burden is likely responsible for the biliary obstruction.  I discussed the risk/benefit of chemotherapy with Lisa Guerra and her family. She understands no therapy will be curative. She understands the increased risk of toxicity with chemotherapy in the setting of hyperbilirubinemia. We discussed comfort care.  She decided to proceed with a trial of systemic therapy. She began FOLFOX chemotherapy on 09/29/2016. The 5 fluorouracil bolus was eliminated and the 5 fluorouracil infusion was dose reduced. The oxaliplatin  was also dose reduced.  Lisa Guerra tolerated the chemotherapy without significant acute toxicity. She appeared stable for discharge at the completion of chemotherapy on 10/01/2016.  She underwent  placement of a Port-A-Cath and biopsy of a liver lesion 09/25/2016. The pathology confirmed metastatic colon cancer.  Outpatient follow-up will be scheduled at the Cancer center for 10/06/2016.    Discharge Instructions    SCHEDULING COMMUNICATION    Complete by:  As directed    Chemotherapy Appointment - 5 hours   TREATMENT CONDITIONS    Complete by:  As directed    Patient should have CBC & CMP within 7 days prior to chemotherapy administration. NOTIFY MD IF: ANC < 1500, Hemoglobin < 8, PLT < 100,000,  Total Bili > 1.5, Creatinine > 1.5, ALT & AST > 80 or if patient has unstable vital signs: Temperature > 38.5, SBP > 180 or < 90, RR > 30 or HR > 100.      Signed: Betsy Coder, MD 10/01/2016, 2:28 PM

## 2016-10-01 NOTE — Progress Notes (Addendum)
Nutrition Follow-up  DOCUMENTATION CODES:   Severe malnutrition in context of chronic illness  INTERVENTION:   D/C Magic cups order d/t starting FOLFOX chemo Continue Snacks- Mighty shake and peanut butter crackers  RD to continue to monitor  NUTRITION DIAGNOSIS:   Malnutrition related to cancer and cancer related treatments as evidenced by percent weight loss, severe depletion of muscle mass, severe depletion of body fat.  Ongoing.  GOAL:   Patient will meet greater than or equal to 90% of their needs  Progressing.  MONITOR:   PO intake, Supplement acceptance, Labs, Weight trends  ASSESSMENT:   71 y.o.year old female with a history of anemia, hypertension, who   recently was diagnosed with metastatic colon cancer after colonoscopy Sep 07, 2016 by [facility], revealing a large mass at the cecum (adenocarcinoma)  . Staging with CT chest/abd/pelvis revealed numerous small pulmonary nodules consistent with metastases, extensive liver metastases.She was seen by Dr. Benay Spice with Oncology on 2/19 and felt to have synchronous right and left colon primaries. Liver biopsy arranged for 3/1 with plans for systemic therapy. She presented with complaints of pain under her right breast that started several days ago associated with a dry cough.  Pt in room with snacks and supplements at bedside untouched. Pt states she had an episode of emesis last night but no longer feels nauseous today. Pt craved some apples this morning and ate a few. Pt states she does not like Ensure supplements, with peanut butter crackers at bedside and Mightyshakes. Pt states she has not tried the [employer] and would try them later today. PO intakes have ranged from 20-50% recently. On 3/6 she ate 100% of meals. Pt started FOLFOX chemo on 3/6.  Medications: Ferrous sulfate tablet TID, IV Zofran PRN Labs reviewed: Low Na  Diet Order:  Diet regular Room service appropriate? Yes; Fluid consistency: Thin  Skin:   Reviewed, no issues  Last BM:  3/7  Height:   Ht Readings from Last 1 Encounters:  09/24/16 5\' 6"  (1.676 m)    Weight:   Wt Readings from Last 1 Encounters:  09/24/16 102 lb 6.4 oz (46.4 kg)    Ideal Body Weight:  59 kg  BMI:  Body mass index is 16.53 kg/m.  Estimated Nutritional Needs:   Kcal:  1400-1700kcal/day   Protein:  69-79g/day   Fluid:  >1.4L/day   EDUCATION NEEDS:   Education needs addressed  Clayton Bibles, MS, RD, LDN Pager: [phone] After Hours Pager: [phone]

## 2016-10-02 ENCOUNTER — Encounter (HOSPITAL_COMMUNITY): Payer: Medicare Other

## 2016-10-02 ENCOUNTER — Telehealth: Payer: Self-pay | Admitting: *Deleted

## 2016-10-02 NOTE — Telephone Encounter (Signed)
Call received from patient and patient's son with questions how to take Oxycodone.  Oxycodone directions reviewed with patient and her son.  Teach back done.  Patient states that she has taken three Oxycodone since she was discharged on 10/01/16 and that they are effective for her pain relief at this time.  Patient instructed to call [facility] if she has any further questions or concerns.

## 2016-10-06 ENCOUNTER — Telehealth: Payer: Self-pay | Admitting: Nurse Practitioner

## 2016-10-06 ENCOUNTER — Ambulatory Visit (HOSPITAL_BASED_OUTPATIENT_CLINIC_OR_DEPARTMENT_OTHER): Payer: Medicare Other | Admitting: Nurse Practitioner

## 2016-10-06 ENCOUNTER — Other Ambulatory Visit (HOSPITAL_BASED_OUTPATIENT_CLINIC_OR_DEPARTMENT_OTHER): Payer: Medicare Other

## 2016-10-06 VITALS — BP 125/87 | HR 84 | Temp 97.7°F | Resp 18 | Ht 65.0 in | Wt 104.6 lb

## 2016-10-06 DIAGNOSIS — C189 Malignant neoplasm of colon, unspecified: Secondary | ICD-10-CM

## 2016-10-06 DIAGNOSIS — C180 Malignant neoplasm of cecum: Secondary | ICD-10-CM | POA: Diagnosis not present

## 2016-10-06 DIAGNOSIS — D630 Anemia in neoplastic disease: Secondary | ICD-10-CM

## 2016-10-06 DIAGNOSIS — N189 Chronic kidney disease, unspecified: Secondary | ICD-10-CM | POA: Diagnosis not present

## 2016-10-06 DIAGNOSIS — C787 Secondary malignant neoplasm of liver and intrahepatic bile duct: Secondary | ICD-10-CM | POA: Diagnosis not present

## 2016-10-06 DIAGNOSIS — C799 Secondary malignant neoplasm of unspecified site: Secondary | ICD-10-CM

## 2016-10-06 DIAGNOSIS — I10 Essential (primary) hypertension: Secondary | ICD-10-CM | POA: Diagnosis not present

## 2016-10-06 LAB — COMPREHENSIVE METABOLIC PANEL
ALK PHOS: 747 U/L — AB (ref 40–150)
ALT: 74 U/L — ABNORMAL HIGH (ref 0–55)
AST: 59 U/L — AB (ref 5–34)
Anion Gap: 8 mEq/L (ref 3–11)
BUN: 16.9 mg/dL (ref 7.0–26.0)
CALCIUM: 9 mg/dL (ref 8.4–10.4)
CO2: 27 mEq/L (ref 22–29)
Chloride: 103 mEq/L (ref 98–109)
Creatinine: 0.9 mg/dL (ref 0.6–1.1)
EGFR: 73 mL/min/{1.73_m2} — ABNORMAL LOW (ref >90)
Glucose: 68 mg/dl — ABNORMAL LOW (ref 70–140)
POTASSIUM: 4 meq/L (ref 3.5–5.1)
Sodium: 137 mEq/L (ref 136–145)
Total Bilirubin: 4.49 mg/dL (ref 0.20–1.20)
Total Protein: 6.4 g/dL (ref 6.4–8.3)

## 2016-10-06 LAB — CBC WITH DIFFERENTIAL/PLATELET
BASO%: 0.5 % (ref 0.0–2.0)
Basophils Absolute: 0.1 10*3/uL (ref 0.0–0.1)
EOS ABS: 0.1 10*3/uL (ref 0.0–0.5)
EOS%: 0.5 % (ref 0.0–7.0)
HEMATOCRIT: 26.3 % — AB (ref 34.8–46.6)
HEMOGLOBIN: 8.2 g/dL — AB (ref 11.6–15.9)
LYMPH#: 1.7 10*3/uL (ref 0.9–3.3)
LYMPH%: 15.9 % (ref 14.0–49.7)
MCH: 25.7 pg (ref 25.1–34.0)
MCHC: 31.2 g/dL — ABNORMAL LOW (ref 31.5–36.0)
MCV: 82.4 fL (ref 79.5–101.0)
MONO#: 0.8 10*3/uL (ref 0.1–0.9)
MONO%: 7.7 % (ref 0.0–14.0)
NEUT#: 8 10*3/uL — ABNORMAL HIGH (ref 1.5–6.5)
NEUT%: 75.4 % (ref 38.4–76.8)
Platelets: 421 10*3/uL — ABNORMAL HIGH (ref 145–400)
RBC: 3.19 10*6/uL — ABNORMAL LOW (ref 3.70–5.45)
RDW: 21.2 % — ABNORMAL HIGH (ref 11.2–14.5)
WBC: 10.6 10*3/uL — ABNORMAL HIGH (ref 3.9–10.3)
nRBC: 0 % (ref 0–0)

## 2016-10-06 MED ORDER — PROCHLORPERAZINE MALEATE 5 MG PO TABS: 5.0000 mg | ORAL_TABLET | Freq: Four times a day (QID) | ORAL | 2 refills | Status: DC | PRN

## 2016-10-06 MED ORDER — LIDOCAINE-PRILOCAINE 2.5-2.5 % EX CREA: 1.0000 "application " | TOPICAL_CREAM | CUTANEOUS | 2 refills | Status: AC | PRN

## 2016-10-06 NOTE — Telephone Encounter (Signed)
Appointments scheduled per 3/13 LOS. Patient given AVS report and calendars with future scheduled appointments.  °

## 2016-10-06 NOTE — Progress Notes (Addendum)
  [facility] OFFICE PROGRESS NOTE   Diagnosis:  Colon cancer  INTERVAL HISTORY:   Lisa Guerra returns as scheduled. She completed cycle 1 FOLFOX beginning 09/29/2016. She denies nausea/vomiting. No mouth sores. No diarrhea. She avoided cold for several days following treatment. She has resumed a regular diet. Her appetite is better. She is no longer having abdominal pain. She intermittently notes a small a lot of blood with bowel movements. She thinks the jaundice is less.  Objective:  Vital signs in last 24 hours:  Blood pressure 125/87, pulse 84, temperature 97.7 F (36.5 C), temperature source Oral, resp. rate 18, height 5\' 5"  (1.651 m), weight 104 lb 9.6 oz (47.4 kg), SpO2 100 %.    HEENT: No thrush or ulcers. Resp: Lungs clear bilaterally. Cardio: Regular rate and rhythm. GI: Abdomen is soft. There is a fullness in the right upper abdomen. Nontender. Vascular: Trace bilateral lower leg edema. Calves soft and nontender.  Port-A-Cath without erythema.  Lab Results:  Lab Results  Component Value Date   WBC 10.6 (H) 10/06/2016   HGB 8.2 (L) 10/06/2016   HCT 26.3 (L) 10/06/2016   MCV 82.4 10/06/2016   PLT 421 (H) 10/06/2016   NEUTROABS 8.0 (H) 10/06/2016    Imaging:  No results found.  Medications: I have reviewed the patient's current medications.  Assessment/Plan: 1. Metastatic colon cancer-adenocarcinoma on biopsy of a cecal mass 09/07/2016  Separate polypoid mass at the sigmoid colon, biopsy to 15 2018 revealed a tubular adenoma with at least high-grade dysplasia  Staging CTs of the chest, abdomen, and pelvis 09/08/2016-extensive liver and lung metastases, bulky abdominal/retroperitoneal lymphadenopathy  Daryel Gerald elevated CEA  Ultrasound-guided biopsy of a liver lesion 09/25/2016 confirmed metastatic colon cancer  Cycle 1 FOLFOX 09/29/2016  2.  Iron deficiency anemia secondary to #1  Red cell transfusion 09/24/2016    IV iron  09/27/2016  3.  Chronic renal insufficiency  4.   Polyps noted on the colonoscopy 09/07/2016 including a polyp with high-grade dysplasia  5.   Hypertension  6.   Family history of multiple cancers including pancreas cancer  7.   Lipidemia  8.   Hyperbilirubinemia secondary to extensive metastatic disease involving the liver  CT abdomen 09/29/2016 with stable mild intrahepatic biliary dilatation.  Bilirubin improved 10/06/2016   Disposition: Lisa Guerra appears stable. She has completed 1 cycle of FOLFOX. She seems to have tolerated the first cycle well with no acute toxicity. The bilirubin is significantly better. She will return for a follow-up visit and cycle 2 FOLFOX on 10/13/2016. She will contact the office in the interim with any problems.  Patient seen with Dr. Benay Spice.    Ned Card ANP/GNP-BC   10/06/2016  3:03 PM This was a shared visit with Ned Card. Lisa Guerra tolerated the first cycle of FOLFOX without significant acute toxicity. The hyperbilirubinemia has improved. Her pain has improved. She will return for an office visit and cycle 2 on 10/13/2016.  Julieanne Manson, M.D.

## 2016-10-07 ENCOUNTER — Encounter: Payer: Self-pay | Admitting: *Deleted

## 2016-10-07 NOTE — Progress Notes (Signed)
[place] Work  Clinical Social Work was referred by nurse for assessment of psychosocial needs due to "help requested at home."  Clinical Social Worker contacted daughter in law as directed and left message in attempt to address this concern. CSW encouraged family of patient to return CSW call. CSW to follow and assist accordingly.   Loren Racer, LCSW, OSW-C Clinical Social Worker [place]  [place] Phone: [phone] Fax: [phone]

## 2016-10-08 ENCOUNTER — Encounter: Payer: Self-pay | Admitting: Pharmacist

## 2016-10-11 ENCOUNTER — Other Ambulatory Visit: Payer: Self-pay | Admitting: Oncology

## 2016-10-12 ENCOUNTER — Other Ambulatory Visit: Payer: Self-pay | Admitting: *Deleted

## 2016-10-12 ENCOUNTER — Telehealth: Payer: Self-pay | Admitting: *Deleted

## 2016-10-12 NOTE — Telephone Encounter (Signed)
"  I'm calling for my mom.  She has chemotherapy tomorrow.  Want to make sure she's on track.  two to three days ago swelling in both her feet.  Now swelling up to both her knees.  Tenderness noticed.  She says this is normal for her but I want to make sure there isn't anything we need to do.  Left may be slightly larger and more tender than the right.  Has some discomfort walking up stairs."  Will notify providers.  This nurse advised keep legs elevated and drink water.

## 2016-10-13 ENCOUNTER — Other Ambulatory Visit (HOSPITAL_BASED_OUTPATIENT_CLINIC_OR_DEPARTMENT_OTHER): Payer: Medicare Other

## 2016-10-13 ENCOUNTER — Ambulatory Visit (HOSPITAL_BASED_OUTPATIENT_CLINIC_OR_DEPARTMENT_OTHER): Payer: Medicare Other

## 2016-10-13 ENCOUNTER — Telehealth: Payer: Self-pay | Admitting: Nurse Practitioner

## 2016-10-13 ENCOUNTER — Ambulatory Visit: Payer: Medicare Other

## 2016-10-13 ENCOUNTER — Ambulatory Visit (HOSPITAL_BASED_OUTPATIENT_CLINIC_OR_DEPARTMENT_OTHER): Payer: Medicare Other | Admitting: Nurse Practitioner

## 2016-10-13 VITALS — BP 125/90 | HR 102 | Temp 97.8°F | Resp 17 | Wt 102.3 lb

## 2016-10-13 DIAGNOSIS — I10 Essential (primary) hypertension: Secondary | ICD-10-CM | POA: Diagnosis not present

## 2016-10-13 DIAGNOSIS — M25462 Effusion, left knee: Secondary | ICD-10-CM | POA: Diagnosis not present

## 2016-10-13 DIAGNOSIS — C180 Malignant neoplasm of cecum: Secondary | ICD-10-CM

## 2016-10-13 DIAGNOSIS — N189 Chronic kidney disease, unspecified: Secondary | ICD-10-CM

## 2016-10-13 DIAGNOSIS — D630 Anemia in neoplastic disease: Secondary | ICD-10-CM

## 2016-10-13 DIAGNOSIS — Z5111 Encounter for antineoplastic chemotherapy: Secondary | ICD-10-CM

## 2016-10-13 DIAGNOSIS — C799 Secondary malignant neoplasm of unspecified site: Secondary | ICD-10-CM

## 2016-10-13 DIAGNOSIS — C787 Secondary malignant neoplasm of liver and intrahepatic bile duct: Secondary | ICD-10-CM

## 2016-10-13 DIAGNOSIS — C189 Malignant neoplasm of colon, unspecified: Secondary | ICD-10-CM

## 2016-10-13 DIAGNOSIS — M25461 Effusion, right knee: Secondary | ICD-10-CM

## 2016-10-13 LAB — COMPREHENSIVE METABOLIC PANEL
ALBUMIN: 2 g/dL — AB (ref 3.5–5.0)
ALK PHOS: 482 U/L — AB (ref 40–150)
ALT: 38 U/L (ref 0–55)
AST: 38 U/L — AB (ref 5–34)
Anion Gap: 10 mEq/L (ref 3–11)
BILIRUBIN TOTAL: 2.99 mg/dL — AB (ref 0.20–1.20)
BUN: 18.2 mg/dL (ref 7.0–26.0)
CO2: 22 mEq/L (ref 22–29)
CREATININE: 0.9 mg/dL (ref 0.6–1.1)
Calcium: 8.7 mg/dL (ref 8.4–10.4)
Chloride: 109 mEq/L (ref 98–109)
EGFR: 79 mL/min/{1.73_m2} — ABNORMAL LOW (ref >90)
GLUCOSE: 89 mg/dL (ref 70–140)
Potassium: 4.2 mEq/L (ref 3.5–5.1)
SODIUM: 140 meq/L (ref 136–145)
TOTAL PROTEIN: 6.6 g/dL (ref 6.4–8.3)

## 2016-10-13 LAB — CBC WITH DIFFERENTIAL/PLATELET
BASO%: 0.9 % (ref 0.0–2.0)
Basophils Absolute: 0.1 10*3/uL (ref 0.0–0.1)
EOS ABS: 0.1 10*3/uL (ref 0.0–0.5)
EOS%: 0.5 % (ref 0.0–7.0)
HCT: 24.3 % — ABNORMAL LOW (ref 34.8–46.6)
HEMOGLOBIN: 7.9 g/dL — AB (ref 11.6–15.9)
LYMPH%: 6.1 % — ABNORMAL LOW (ref 14.0–49.7)
MCH: 26.9 pg (ref 25.1–34.0)
MCHC: 32.3 g/dL (ref 31.5–36.0)
MCV: 83.1 fL (ref 79.5–101.0)
MONO#: 0.8 10*3/uL (ref 0.1–0.9)
MONO%: 5.1 % (ref 0.0–14.0)
NEUT%: 87.4 % — ABNORMAL HIGH (ref 38.4–76.8)
NEUTROS ABS: 14.6 10*3/uL — AB (ref 1.5–6.5)
Platelets: 508 10*3/uL — ABNORMAL HIGH (ref 145–400)
RBC: 2.93 10*6/uL — ABNORMAL LOW (ref 3.70–5.45)
RDW: 23.2 % — AB (ref 11.2–14.5)
WBC: 16.7 10*3/uL — AB (ref 3.9–10.3)
lymph#: 1 10*3/uL (ref 0.9–3.3)

## 2016-10-13 LAB — URIC ACID: URIC ACID, SERUM: 7.9 mg/dL — AB (ref 2.6–7.4)

## 2016-10-13 MED ORDER — OXALIPLATIN CHEMO INJECTION 100 MG/20ML
65.0000 mg/m2 | Freq: Once | INTRAVENOUS | Status: AC
  Administered 2016-10-13: 95 mg via INTRAVENOUS
  Filled 2016-10-13: qty 19

## 2016-10-13 MED ORDER — METHYLPREDNISOLONE 4 MG PO TBPK: ORAL_TABLET | ORAL | 0 refills | Status: DC

## 2016-10-13 MED ORDER — PALONOSETRON HCL INJECTION 0.25 MG/5ML
0.2500 mg | Freq: Once | INTRAVENOUS | Status: AC
  Administered 2016-10-13: 0.25 mg via INTRAVENOUS

## 2016-10-13 MED ORDER — DEXTROSE 5 % IV SOLN
Freq: Once | INTRAVENOUS | Status: AC
  Administered 2016-10-13: 12:00:00 via INTRAVENOUS

## 2016-10-13 MED ORDER — PALONOSETRON HCL INJECTION 0.25 MG/5ML
INTRAVENOUS | Status: AC
  Filled 2016-10-13: qty 5

## 2016-10-13 MED ORDER — SODIUM CHLORIDE 0.9 % IV SOLN
1600.0000 mg/m2 | INTRAVENOUS | Status: DC
  Administered 2016-10-13: 2350 mg via INTRAVENOUS
  Filled 2016-10-13: qty 47

## 2016-10-13 MED ORDER — DEXAMETHASONE SODIUM PHOSPHATE 10 MG/ML IJ SOLN
10.0000 mg | Freq: Once | INTRAMUSCULAR | Status: AC
  Administered 2016-10-13: 10 mg via INTRAVENOUS

## 2016-10-13 MED ORDER — DEXAMETHASONE SODIUM PHOSPHATE 10 MG/ML IJ SOLN
INTRAMUSCULAR | Status: AC
  Filled 2016-10-13: qty 1

## 2016-10-13 NOTE — Patient Instructions (Signed)
[facility] Discharge Instructions for Patients Receiving Chemotherapy  Today you received the following chemotherapy agents: Oxaliplatin, 5FU  To help prevent nausea and vomiting after your treatment, we encourage you to take your nausea medication as prescribed.    If you develop nausea and vomiting that is not controlled by your nausea medication, call the clinic.   BELOW ARE SYMPTOMS THAT SHOULD BE REPORTED IMMEDIATELY:  *FEVER GREATER THAN 100.5 F  *CHILLS WITH OR WITHOUT FEVER  NAUSEA AND VOMITING THAT IS NOT CONTROLLED WITH YOUR NAUSEA MEDICATION  *UNUSUAL SHORTNESS OF BREATH  *UNUSUAL BRUISING OR BLEEDING  TENDERNESS IN MOUTH AND THROAT WITH OR WITHOUT PRESENCE OF ULCERS  *URINARY PROBLEMS  *BOWEL PROBLEMS  UNUSUAL RASH Items with * indicate a potential emergency and should be followed up as soon as possible.  Feel free to call the clinic you have any questions or concerns. The clinic phone number is [phone].  Please show the [facility] at check-in to the Emergency Department and triage nurse.  Oxaliplatin Injection What is this medicine? OXALIPLATIN (ox AL i PLA tin) is a chemotherapy drug. It targets fast dividing cells, like cancer cells, and causes these cells to die. This medicine is used to treat cancers of the colon and rectum, and many other cancers. This medicine may be used for other purposes; ask your health care provider or pharmacist if you have questions. COMMON BRAND NAME(S): Eloxatin What should I tell my health care provider before I take this medicine? They need to know if you have any of these conditions: -kidney disease -an unusual or allergic reaction to oxaliplatin, other chemotherapy, other medicines, foods, dyes, or preservatives -pregnant or trying to get pregnant -breast-feeding How should I use this medicine? This drug is given as an infusion into a vein. It is administered in a hospital or clinic by a  specially trained health care professional. Talk to your pediatrician regarding the use of this medicine in children. Special care may be needed. Overdosage: If you think you have taken too much of this medicine contact a poison control center or emergency room at once. NOTE: This medicine is only for you. Do not share this medicine with others. What if I miss a dose? It is important not to miss a dose. Call your doctor or health care professional if you are unable to keep an appointment. What may interact with this medicine? -medicines to increase blood counts like filgrastim, pegfilgrastim, sargramostim -probenecid -some antibiotics like amikacin, gentamicin, neomycin, polymyxin B, streptomycin, tobramycin -zalcitabine Talk to your doctor or health care professional before taking any of these medicines: -acetaminophen -aspirin -ibuprofen -ketoprofen -naproxen This list may not describe all possible interactions. Give your health care provider a list of all the medicines, herbs, non-prescription drugs, or dietary supplements you use. Also tell them if you smoke, drink alcohol, or use illegal drugs. Some items may interact with your medicine. What should I watch for while using this medicine? Your condition will be monitored carefully while you are receiving this medicine. You will need important blood work done while you are taking this medicine. This medicine can make you more sensitive to cold. Do not drink cold drinks or use ice. Cover exposed skin before coming in contact with cold temperatures or cold objects. When out in cold weather wear warm clothing and cover your mouth and nose to warm the air that goes into your lungs. Tell your doctor if you get sensitive to the cold. This drug  may make you feel generally unwell. This is not uncommon, as chemotherapy can affect healthy cells as well as cancer cells. Report any side effects. Continue your course of treatment even though you feel ill  unless your doctor tells you to stop. In some cases, you may be given additional medicines to help with side effects. Follow all directions for their use. Call your doctor or health care professional for advice if you get a fever, chills or sore throat, or other symptoms of a cold or flu. Do not treat yourself. This drug decreases your body's ability to fight infections. Try to avoid being around people who are sick. This medicine may increase your risk to bruise or bleed. Call your doctor or health care professional if you notice any unusual bleeding. Be careful brushing and flossing your teeth or using a toothpick because you may get an infection or bleed more easily. If you have any dental work done, tell your dentist you are receiving this medicine. Avoid taking products that contain aspirin, acetaminophen, ibuprofen, naproxen, or ketoprofen unless instructed by your doctor. These medicines may hide a fever. Do not become pregnant while taking this medicine. Women should inform their doctor if they wish to become pregnant or think they might be pregnant. There is a potential for serious side effects to an unborn child. Talk to your health care professional or pharmacist for more information. Do not breast-feed an infant while taking this medicine. Call your doctor or health care professional if you get diarrhea. Do not treat yourself. What side effects may I notice from receiving this medicine? Side effects that you should report to your doctor or health care professional as soon as possible: -allergic reactions like skin rash, itching or hives, swelling of the face, lips, or tongue -low blood counts - This drug may decrease the number of white blood cells, red blood cells and platelets. You may be at increased risk for infections and bleeding. -signs of infection - fever or chills, cough, sore throat, pain or difficulty passing urine -signs of decreased platelets or bleeding - bruising, pinpoint  red spots on the skin, black, tarry stools, nosebleeds -signs of decreased red blood cells - unusually weak or tired, fainting spells, lightheadedness -breathing problems -chest pain, pressure -cough -diarrhea -jaw tightness -mouth sores -nausea and vomiting -pain, swelling, redness or irritation at the injection site -pain, tingling, numbness in the hands or feet -problems with balance, talking, walking -redness, blistering, peeling or loosening of the skin, including inside the mouth -trouble passing urine or change in the amount of urine Side effects that usually do not require medical attention (report to your doctor or health care professional if they continue or are bothersome): -changes in vision -constipation -hair loss -loss of appetite -metallic taste in the mouth or changes in taste -stomach pain This list may not describe all possible side effects. Call your doctor for medical advice about side effects. You may report side effects to FDA at 1-800-FDA-1088. Where should I keep my medicine? This drug is given in a hospital or clinic and will not be stored at home. NOTE: This sheet is a summary. It may not cover all possible information. If you have questions about this medicine, talk to your doctor, pharmacist, or health care provider.  2018 Elsevier/Gold Standard (2008-02-07 17:22:47)  Fluorouracil, 5-FU injection What is this medicine? FLUOROURACIL, 5-FU (flure oh YOOR a sil) is a chemotherapy drug. It slows the growth of cancer cells. This medicine is used to treat  many types of cancer like breast cancer, colon or rectal cancer, pancreatic cancer, and stomach cancer. This medicine may be used for other purposes; ask your health care provider or pharmacist if you have questions. COMMON BRAND NAME(S): Adrucil What should I tell my health care provider before I take this medicine? They need to know if you have any of these conditions: -blood disorders -dihydropyrimidine  dehydrogenase (DPD) deficiency -infection (especially a virus infection such as chickenpox, cold sores, or herpes) -kidney disease -liver disease -malnourished, poor nutrition -recent or ongoing radiation therapy -an unusual or allergic reaction to fluorouracil, other chemotherapy, other medicines, foods, dyes, or preservatives -pregnant or trying to get pregnant -breast-feeding How should I use this medicine? This drug is given as an infusion or injection into a vein. It is administered in a hospital or clinic by a specially trained health care professional. Talk to your pediatrician regarding the use of this medicine in children. Special care may be needed. Overdosage: If you think you have taken too much of this medicine contact a poison control center or emergency room at once. NOTE: This medicine is only for you. Do not share this medicine with others. What if I miss a dose? It is important not to miss your dose. Call your doctor or health care professional if you are unable to keep an appointment. What may interact with this medicine? -allopurinol -cimetidine -dapsone -digoxin -hydroxyurea -leucovorin -levamisole -medicines for seizures like ethotoin, fosphenytoin, phenytoin -medicines to increase blood counts like filgrastim, pegfilgrastim, sargramostim -medicines that treat or prevent blood clots like warfarin, enoxaparin, and dalteparin -methotrexate -metronidazole -pyrimethamine -some other chemotherapy drugs like busulfan, cisplatin, estramustine, vinblastine -trimethoprim -trimetrexate -vaccines Talk to your doctor or health care professional before taking any of these medicines: -acetaminophen -aspirin -ibuprofen -ketoprofen -naproxen This list may not describe all possible interactions. Give your health care provider a list of all the medicines, herbs, non-prescription drugs, or dietary supplements you use. Also tell them if you smoke, drink alcohol, or use  illegal drugs. Some items may interact with your medicine. What should I watch for while using this medicine? Visit your doctor for checks on your progress. This drug may make you feel generally unwell. This is not uncommon, as chemotherapy can affect healthy cells as well as cancer cells. Report any side effects. Continue your course of treatment even though you feel ill unless your doctor tells you to stop. In some cases, you may be given additional medicines to help with side effects. Follow all directions for their use. Call your doctor or health care professional for advice if you get a fever, chills or sore throat, or other symptoms of a cold or flu. Do not treat yourself. This drug decreases your body's ability to fight infections. Try to avoid being around people who are sick. This medicine may increase your risk to bruise or bleed. Call your doctor or health care professional if you notice any unusual bleeding. Be careful brushing and flossing your teeth or using a toothpick because you may get an infection or bleed more easily. If you have any dental work done, tell your dentist you are receiving this medicine. Avoid taking products that contain aspirin, acetaminophen, ibuprofen, naproxen, or ketoprofen unless instructed by your doctor. These medicines may hide a fever. Do not become pregnant while taking this medicine. Women should inform their doctor if they wish to become pregnant or think they might be pregnant. There is a potential for serious side effects to an unborn child.  Talk to your health care professional or pharmacist for more information. Do not breast-feed an infant while taking this medicine. Men should inform their doctor if they wish to father a child. This medicine may lower sperm counts. Do not treat diarrhea with over the counter products. Contact your doctor if you have diarrhea that lasts more than 2 days or if it is severe and watery. This medicine can make you more  sensitive to the sun. Keep out of the sun. If you cannot avoid being in the sun, wear protective clothing and use sunscreen. Do not use sun lamps or tanning beds/booths. What side effects may I notice from receiving this medicine? Side effects that you should report to your doctor or health care professional as soon as possible: -allergic reactions like skin rash, itching or hives, swelling of the face, lips, or tongue -low blood counts - this medicine may decrease the number of white blood cells, red blood cells and platelets. You may be at increased risk for infections and bleeding. -signs of infection - fever or chills, cough, sore throat, pain or difficulty passing urine -signs of decreased platelets or bleeding - bruising, pinpoint red spots on the skin, black, tarry stools, blood in the urine -signs of decreased red blood cells - unusually weak or tired, fainting spells, lightheadedness -breathing problems -changes in vision -chest pain -mouth sores -nausea and vomiting -pain, swelling, redness at site where injected -pain, tingling, numbness in the hands or feet -redness, swelling, or sores on hands or feet -stomach pain -unusual bleeding Side effects that usually do not require medical attention (report to your doctor or health care professional if they continue or are bothersome): -changes in finger or toe nails -diarrhea -dry or itchy skin -hair loss -headache -loss of appetite -sensitivity of eyes to the light -stomach upset -unusually teary eyes This list may not describe all possible side effects. Call your doctor for medical advice about side effects. You may report side effects to FDA at 1-800-FDA-1088. Where should I keep my medicine? This drug is given in a hospital or clinic and will not be stored at home. NOTE: This sheet is a summary. It may not cover all possible information. If you have questions about this medicine, talk to your doctor, pharmacist, or health care  provider.  2018 Elsevier/Gold Standard (2007-11-16 13:53:16)

## 2016-10-13 NOTE — Progress Notes (Signed)
Per Judson Roch, LPN, per Lattie Haw, NP, [facility] to treat with today's labs.  Wylene Simmer, BSN, RN 10/13/2016 11:35 AM

## 2016-10-13 NOTE — Telephone Encounter (Signed)
Appointments scheduled per 3.20.18 LOS. Patient given AVS report and calendars with future scheduled appointments.  °

## 2016-10-13 NOTE — Patient Instructions (Signed)
Implanted Port Home Guide An implanted port is a type of central line that is placed under the skin. Central lines are used to provide IV access when treatment or nutrition needs to be given through a person's veins. Implanted ports are used for long-term IV access. An implanted port may be placed because:  You need IV medicine that would be irritating to the small veins in your hands or arms.  You need long-term IV medicines, such as antibiotics.  You need IV nutrition for a long period.  You need frequent blood draws for lab tests.  You need dialysis.  Implanted ports are usually placed in the chest area, but they can also be placed in the upper arm, the abdomen, or the leg. An implanted port has two main parts:  Reservoir. The reservoir is round and will appear as a small, raised area under your skin. The reservoir is the part where a needle is inserted to give medicines or draw blood.  Catheter. The catheter is a thin, flexible tube that extends from the reservoir. The catheter is placed into a large vein. Medicine that is inserted into the reservoir goes into the catheter and then into the vein.  How will I care for my incision site? Do not get the incision site wet. Bathe or shower as directed by your health care provider. How is my port accessed? Special steps must be taken to access the port:  Before the port is accessed, a numbing cream can be placed on the skin. This helps numb the skin over the port site.  Your health care provider uses a sterile technique to access the port. ? Your health care provider must put on a mask and sterile gloves. ? The skin over your port is cleaned carefully with an antiseptic and allowed to dry. ? The port is gently pinched between sterile gloves, and a needle is inserted into the port.  Only "non-coring" port needles should be used to access the port. Once the port is accessed, a blood return should be checked. This helps ensure that the port  is in the vein and is not clogged.  If your port needs to remain accessed for a constant infusion, a clear (transparent) bandage will be placed over the needle site. The bandage and needle will need to be changed every week, or as directed by your health care provider.  Keep the bandage covering the needle clean and dry. Do not get it wet. Follow your health care provider's instructions on how to take a shower or bath while the port is accessed.  If your port does not need to stay accessed, no bandage is needed over the port.  What is flushing? Flushing helps keep the port from getting clogged. Follow your health care provider's instructions on how and when to flush the port. Ports are usually flushed with saline solution or a medicine called heparin. The need for flushing will depend on how the port is used.  If the port is used for intermittent medicines or blood draws, the port will need to be flushed: ? After medicines have been given. ? After blood has been drawn. ? As part of routine maintenance.  If a constant infusion is running, the port may not need to be flushed.  How long will my port stay implanted? The port can stay in for as long as your health care provider thinks it is needed. When it is time for the port to come out, surgery will be   done to remove it. The procedure is similar to the one performed when the port was put in. When should I seek immediate medical care? When you have an implanted port, you should seek immediate medical care if:  You notice a bad smell coming from the incision site.  You have swelling, redness, or drainage at the incision site.  You have more swelling or pain at the port site or the surrounding area.  You have a fever that is not controlled with medicine.  This information is not intended to replace advice given to you by your health care provider. Make sure you discuss any questions you have with your health care provider. Document  Released: 07/13/2005 Document Revised: 12/19/2015 Document Reviewed: 03/20/2013 Elsevier Interactive Patient Education  2017 Elsevier Inc.  

## 2016-10-13 NOTE — Progress Notes (Addendum)
[facility] OFFICE PROGRESS NOTE   Diagnosis:  Colon cancer  INTERVAL HISTORY:   Lisa Guerra returns as scheduled. She completed cycle 1 FOLFOX 09/29/2016. She continues to feel better than prior to chemotherapy. No nausea or vomiting. No mouth sores. Bowels are moving. She denies pain. Appetite is better. Urine is lighter. She notes bilateral foot/ankle edema. She has noted recent swelling of the right knee with associated pain. She took oxycodone during the night. No fever. She has had the swelling in the past. No personal history of gout.  Objective:  Vital signs in last 24 hours:  Blood pressure 125/90, pulse (!) 102, temperature 97.8 F (36.6 C), temperature source Oral, resp. rate 17, weight 102 lb 4.8 oz (46.4 kg), SpO2 100 %.    HEENT: No thrush or ulcers. Sclera appear muddy. Question mild scleral icterus. Resp: Lungs clear bilaterally. Cardio: Regular rate and rhythm. GI: Abdomen is soft and nontender. Fullness right upper abdomen. Vascular: Trace bilateral ankle/pedal edema. Musculoskeletal: Appears to have bilateral knee effusions left greater than right. Area of erythema with associated tenderness right knee over the patella.  Port-A-Cath without erythema.  Lab Results:  Lab Results  Component Value Date   WBC 16.7 (H) 10/13/2016   HGB 7.9 (L) 10/13/2016   HCT 24.3 (L) 10/13/2016   MCV 83.1 10/13/2016   PLT 508 (H) 10/13/2016   NEUTROABS 14.6 (H) 10/13/2016    Imaging:  No results found.  Medications: I have reviewed the patient's current medications.  Assessment/Plan: 1. Metastatic colon cancer-adenocarcinoma on biopsy of a cecal mass 09/07/2016  Separate polypoid mass at the sigmoid colon, biopsy to 15 2018 revealed a tubular adenoma with at least high-grade dysplasia  Staging CTs of the chest, abdomen, and pelvis 09/08/2016-extensive liver and lung metastases, bulky abdominal/retroperitoneal lymphadenopathy  Daryel Gerald elevated  CEA  Ultrasound-guided biopsy of a liver lesion 09/25/2016 confirmed metastatic colon cancer  Cycle 1 FOLFOX 09/29/2016  Cycle 2 FOLFOX 10/13/2016  2. Iron deficiency anemia secondary to #1  Red cell transfusion 09/24/2016   IV iron 09/27/2016  3. Chronic renal insufficiency  4. Polyps noted on the colonoscopy 09/07/2016 including a polyp with high-grade dysplasia  5. Hypertension  6. Family history of multiple cancers including pancreas cancer  7. Lipidemia  8. Hyperbilirubinemia secondary to extensive metastatic disease involving the liver  CT abdomen 09/29/2016 with stable mild intrahepatic biliary dilatation.  Bilirubin improved 10/06/2016, 10/13/2016  9.   Bilateral knee effusions left greater than right, erythema/tenderness overlying the right patella   Disposition: Lisa Guerra appears stable. She has completed 1 cycle of FOLFOX. Abdominal pain is better and the bilirubin is further improved. Plan to proceed with cycle 2 FOLFOX today as scheduled with escalation of the 5-fluorouracil dose.  Hemoglobin overall is stable. She appears asymptomatic from the anemia. Signs/symptoms suggestive of progressive anemia reviewed at today's visit.  She has developed bilateral knee effusions of unclear etiology. There is evidence of inflammation at the right knee. We will add uric acid to today's labs. She will receive steroids today as part of the premedication regimen. She will begin a Medrol Dosepak 10/14/2016. If there is no improvement with steroids we will make a referral to orthopedics.  She will return for a follow-up visit and cycle 3 FOLFOX in 2 weeks. She will contact the office in the interim with any other problems.  Patient seen with Dr. Benay Spice. 25 minutes were spent face-to-face at today's visit with the majority of that time involved in  counseling/coordination of care.  Ned Card ANP/GNP-BC   10/13/2016  9:41 AM  This was a shared visit  with Ned Card. Lisa Guerra was interviewed and examined. The liver enzymes are improved suggestive of a clinical response to chemotherapy. She has bilateral knee effusions with erythema and tenderness at the right patella. I cannot relate the knee effusions to the chemotherapy regimen. This could be a paraneoplastic phenomenon or gallop. She reports having a left knee effusion in the past.  We prescribed a Medrol Dosepak. She will contact us if the knees are not improved within the next few days.  Julieanne Manson, M.D.

## 2016-10-15 ENCOUNTER — Emergency Department (HOSPITAL_COMMUNITY): Payer: Medicare Other

## 2016-10-15 ENCOUNTER — Ambulatory Visit (HOSPITAL_BASED_OUTPATIENT_CLINIC_OR_DEPARTMENT_OTHER): Payer: Medicare Other

## 2016-10-15 ENCOUNTER — Other Ambulatory Visit: Payer: Self-pay

## 2016-10-15 ENCOUNTER — Encounter (HOSPITAL_COMMUNITY): Payer: Self-pay | Admitting: Emergency Medicine

## 2016-10-15 ENCOUNTER — Inpatient Hospital Stay (HOSPITAL_COMMUNITY)
Admission: EM | Admit: 2016-10-15 | Discharge: 2016-10-17 | DRG: 070 | Disposition: A | Discharge status: Home Health | Payer: Medicare Other | Attending: Internal Medicine | Admitting: Internal Medicine

## 2016-10-15 ENCOUNTER — Emergency Department (HOSPITAL_COMMUNITY)
Admission: EM | Admit: 2016-10-15 | Discharge: 2016-10-15 | Disposition: A | Discharge status: Home / Self Care | Payer: Medicare Other | Source: Home / Self Care | Attending: Emergency Medicine | Admitting: Emergency Medicine

## 2016-10-15 VITALS — BP 125/82 | HR 86 | Temp 97.7°F | Resp 20

## 2016-10-15 DIAGNOSIS — G9340 Encephalopathy, unspecified: Secondary | ICD-10-CM | POA: Diagnosis present

## 2016-10-15 DIAGNOSIS — E860 Dehydration: Secondary | ICD-10-CM | POA: Diagnosis present

## 2016-10-15 DIAGNOSIS — D500 Iron deficiency anemia secondary to blood loss (chronic): Secondary | ICD-10-CM | POA: Diagnosis not present

## 2016-10-15 DIAGNOSIS — Z79899 Other long term (current) drug therapy: Secondary | ICD-10-CM

## 2016-10-15 DIAGNOSIS — E7800 Pure hypercholesterolemia, unspecified: Secondary | ICD-10-CM | POA: Diagnosis not present

## 2016-10-15 DIAGNOSIS — R945 Abnormal results of liver function studies: Secondary | ICD-10-CM

## 2016-10-15 DIAGNOSIS — I10 Essential (primary) hypertension: Secondary | ICD-10-CM | POA: Insufficient documentation

## 2016-10-15 DIAGNOSIS — C180 Malignant neoplasm of cecum: Secondary | ICD-10-CM

## 2016-10-15 DIAGNOSIS — Z9221 Personal history of antineoplastic chemotherapy: Secondary | ICD-10-CM | POA: Diagnosis not present

## 2016-10-15 DIAGNOSIS — E785 Hyperlipidemia, unspecified: Secondary | ICD-10-CM | POA: Diagnosis not present

## 2016-10-15 DIAGNOSIS — N390 Urinary tract infection, site not specified: Secondary | ICD-10-CM | POA: Diagnosis present

## 2016-10-15 DIAGNOSIS — R4182 Altered mental status, unspecified: Secondary | ICD-10-CM

## 2016-10-15 DIAGNOSIS — C787 Secondary malignant neoplasm of liver and intrahepatic bile duct: Secondary | ICD-10-CM | POA: Diagnosis present

## 2016-10-15 DIAGNOSIS — Z86010 Personal history of colonic polyps: Secondary | ICD-10-CM

## 2016-10-15 DIAGNOSIS — Z452 Encounter for adjustment and management of vascular access device: Secondary | ICD-10-CM

## 2016-10-15 DIAGNOSIS — R7989 Other specified abnormal findings of blood chemistry: Secondary | ICD-10-CM | POA: Diagnosis present

## 2016-10-15 DIAGNOSIS — G893 Neoplasm related pain (acute) (chronic): Secondary | ICD-10-CM

## 2016-10-15 DIAGNOSIS — C189 Malignant neoplasm of colon, unspecified: Secondary | ICD-10-CM | POA: Insufficient documentation

## 2016-10-15 DIAGNOSIS — E43 Unspecified severe protein-calorie malnutrition: Secondary | ICD-10-CM | POA: Diagnosis not present

## 2016-10-15 DIAGNOSIS — R590 Localized enlarged lymph nodes: Secondary | ICD-10-CM | POA: Diagnosis present

## 2016-10-15 DIAGNOSIS — R109 Unspecified abdominal pain: Secondary | ICD-10-CM

## 2016-10-15 DIAGNOSIS — C7800 Secondary malignant neoplasm of unspecified lung: Secondary | ICD-10-CM | POA: Diagnosis present

## 2016-10-15 DIAGNOSIS — D638 Anemia in other chronic diseases classified elsewhere: Secondary | ICD-10-CM | POA: Diagnosis present

## 2016-10-15 DIAGNOSIS — Z800 Family history of malignant neoplasm of digestive organs: Secondary | ICD-10-CM | POA: Diagnosis not present

## 2016-10-15 DIAGNOSIS — D649 Anemia, unspecified: Secondary | ICD-10-CM | POA: Diagnosis present

## 2016-10-15 LAB — COMPREHENSIVE METABOLIC PANEL
ALK PHOS: 430 U/L — AB (ref 38–126)
ALT: 36 U/L (ref 14–54)
AST: 44 U/L — AB (ref 15–41)
Albumin: 2.1 g/dL — ABNORMAL LOW (ref 3.5–5.0)
Anion gap: 7 (ref 5–15)
BUN: 27 mg/dL — AB (ref 6–20)
CHLORIDE: 108 mmol/L (ref 101–111)
CO2: 23 mmol/L (ref 22–32)
CREATININE: 0.96 mg/dL (ref 0.44–1.00)
Calcium: 8.4 mg/dL — ABNORMAL LOW (ref 8.9–10.3)
GFR calc Af Amer: >60 mL/min (ref >60)
GFR calc non Af Amer: 59 mL/min — ABNORMAL LOW (ref >60)
Glucose, Bld: 105 mg/dL — ABNORMAL HIGH (ref 65–99)
Potassium: 4.3 mmol/L (ref 3.5–5.1)
Sodium: 138 mmol/L (ref 135–145)
Total Bilirubin: 3.1 mg/dL — ABNORMAL HIGH (ref 0.3–1.2)
Total Protein: 6.1 g/dL — ABNORMAL LOW (ref 6.5–8.1)

## 2016-10-15 LAB — CBC WITH DIFFERENTIAL/PLATELET
BASOS ABS: 0 10*3/uL (ref 0.0–0.1)
BASOS PCT: 0 %
EOS PCT: 0 %
Eosinophils Absolute: 0 10*3/uL (ref 0.0–0.7)
HCT: 22.3 % — ABNORMAL LOW (ref 36.0–46.0)
Hemoglobin: 7.1 g/dL — ABNORMAL LOW (ref 12.0–15.0)
Lymphocytes Relative: 5 %
Lymphs Abs: 0.5 10*3/uL — ABNORMAL LOW (ref 0.7–4.0)
MCH: 26.8 pg (ref 26.0–34.0)
MCHC: 31.8 g/dL (ref 30.0–36.0)
MCV: 84.2 fL (ref 78.0–100.0)
MONO ABS: 0.4 10*3/uL (ref 0.1–1.0)
Monocytes Relative: 4 %
NEUTROS ABS: 9 10*3/uL — AB (ref 1.7–7.7)
Neutrophils Relative %: 91 %
PLATELETS: 391 10*3/uL (ref 150–400)
RBC: 2.65 MIL/uL — ABNORMAL LOW (ref 3.87–5.11)
RDW: 22.1 % — AB (ref 11.5–15.5)
WBC: 9.9 10*3/uL (ref 4.0–10.5)

## 2016-10-15 LAB — URINALYSIS, ROUTINE W REFLEX MICROSCOPIC
Bilirubin Urine: NEGATIVE
Glucose, UA: NEGATIVE mg/dL
KETONES UR: 5 mg/dL — AB
Leukocytes, UA: NEGATIVE
Nitrite: POSITIVE — AB
Protein, ur: NEGATIVE mg/dL
RBC / HPF: NONE SEEN RBC/hpf (ref 0–5)
Specific Gravity, Urine: 1.012 (ref 1.005–1.030)
pH: 5 (ref 5.0–8.0)

## 2016-10-15 LAB — RAPID URINE DRUG SCREEN, HOSP PERFORMED
Amphetamines: NOT DETECTED
BARBITURATES: NOT DETECTED
BENZODIAZEPINES: NOT DETECTED
COCAINE: NOT DETECTED
Opiates: NOT DETECTED
TETRAHYDROCANNABINOL: NOT DETECTED

## 2016-10-15 LAB — ETHANOL: Alcohol, Ethyl (B): <5 mg/dL (ref <5)

## 2016-10-15 LAB — TROPONIN I: Troponin I: <0.03 ng/mL (ref <0.03)

## 2016-10-15 MED ORDER — HEPARIN SOD (PORK) LOCK FLUSH 100 UNIT/ML IV SOLN
500.0000 [IU] | Freq: Once | INTRAVENOUS | Status: AC | PRN
  Administered 2016-10-15: 500 [IU]
  Filled 2016-10-15: qty 5

## 2016-10-15 MED ORDER — SODIUM CHLORIDE 0.9 % IV BOLUS (SEPSIS)
500.0000 mL | Freq: Once | INTRAVENOUS | Status: AC
  Administered 2016-10-15: 500 mL via INTRAVENOUS

## 2016-10-15 MED ORDER — SODIUM CHLORIDE 0.9% FLUSH
10.0000 mL | INTRAVENOUS | Status: DC | PRN
Start: 2016-10-15 — End: 2016-10-15
  Administered 2016-10-15: 10 mL
  Filled 2016-10-15: qty 10

## 2016-10-15 NOTE — Progress Notes (Signed)
Patient presents to chemo area today with her daughter in law for her pump d/c.  She was just seen in the ED for pain control.  She reports that she suddenly had severe pain "around her heart" pointing to her midsternal area.  They gave her a dose of IV fentanyl and sent her to [place] for her pump d/c.  Per daughter in law, the patient only uses her pain meds sparingly at home.  She was given 1 pain pill when her pain started last night but it was so severe and so quick in onset that the family felt that the pain meds were not working fast enough so they called EMS.  At this time, the patient reports no pain and has not had any pain since she was given IV fentanyl.  Per Dr. Benay Spice, no further action at this time.  Patient and daughter in law are aware to call our office should they have any further concerns or questions.

## 2016-10-15 NOTE — ED Notes (Signed)
Bed: GO77 Expected date:  Expected time:  Means of arrival:  Comments: EMS-pain control

## 2016-10-15 NOTE — ED Notes (Signed)
Bed: WA03 Expected date:  Expected time:  Means of arrival:  Comments: 71 yo F  Cancer pt  Confusion

## 2016-10-15 NOTE — Discharge Instructions (Signed)
Go to the cancer center, now for your appointment.

## 2016-10-15 NOTE — ED Triage Notes (Signed)
Per EMS pt from home ,Hx colon Ca . abd pain . Takes oxycodone for pain  Yet no relief. Given 50 mcg Fentanyl IV by EMS, with relief. Alert and oriented x4. No further symptoms

## 2016-10-15 NOTE — ED Triage Notes (Signed)
Pt. Presented by [facility] with altered mental status. Pt. Was in cancer treatment center today and son said she had a "chemo bag removed. Around 1830, patient starting getting confused. She was found prone on the floor, but son stated she did not fall. Non compliant with medications at home. VSS. 18 G LAC. Her baseline is A&O x4.

## 2016-10-15 NOTE — ED Notes (Signed)
Son at bedside sts gave pt her oxycodone dose prior to arrival. Ems gave 50  Mcg Fentanyl IV. Pt sts 0 pain at this time .

## 2016-10-15 NOTE — ED Provider Notes (Signed)
[facility] DEPT Provider Note   CSN: [id number] Arrival date & time: 10/15/16  1149     History   Chief Complaint Chief Complaint  Patient presents with  . Abdominal Pain    colon CA    HPI Lisa Guerra is a 71 y.o. female.  She presents for abdominal pain, by EMS.  During transport she was treated with fentanyl with complete resolution of her pain.  She was seen yesterday and had an infusion started, for colon cancer, and was scheduled to return today at 1:15 PM for removal of the infusion device.  She denies fever, chills, vomiting, weakness, dizziness.  There are no other known modifying factors.  HPI  Past Medical History:  Diagnosis Date  . Anemia   . Cancer ([place])   . Colon cancer ([place])   . High cholesterol   . Hypertension   . Low hemoglobin     Patient Active Problem List   Diagnosis Date Noted  . Goals of care, counseling/discussion 09/28/2016  . Iron deficiency anemia due to chronic blood loss 09/27/2016  . Protein-calorie malnutrition, severe 09/25/2016  . Anemia of chronic disease 09/24/2016  . Elevated LFTs 09/21/2016  . Chest pain 09/20/2016  . Metastatic cancer ([place]) 09/20/2016  . Anemia   . Essential hypertension   . Colon cancer ([place]) 09/14/2016    Past Surgical History:  Procedure Laterality Date  . CESAREAN SECTION     x2   . COLONOSCOPY N/A 09/07/2016   Dr. Wynetta Emery with [name]: two 5 mm polyps in descending colon, large tumor consistent with adenocarcinoma filled cecum/ascending colon, large multi-lobulated ulcerated polypoid lesion at recto-sigmoid junction 20 cm from anal verge.   . IR GENERIC HISTORICAL  09/24/2016   IR FLUORO GUIDE PORT INSERTION RIGHT 09/24/2016 Arne Cleveland, MD [facility]  . IR GENERIC HISTORICAL  09/24/2016   IR US GUIDE VASC ACCESS RIGHT 09/24/2016 Arne Cleveland, MD [facility]  . IR GENERIC HISTORICAL  09/24/2016   IR US GUIDE BX ASP/DRAIN 09/24/2016 Arne Cleveland, MD [facility]  . TONSILLECTOMY    .  TUBAL LIGATION      OB History    No data available       Home Medications    Prior to Admission medications   Medication Sig Start Date End Date Taking? Authorizing Provider  amLODipine (NORVASC) 5 MG tablet Take 5 mg by mouth daily.   Yes Historical Provider, MD  ferrous sulfate 325 (65 FE) MG tablet Take 1 tablet (325 mg total) by mouth 3 (three) times daily with meals. Patient taking differently: Take 325 mg by mouth daily with breakfast.  09/14/16  Yes Ladell Pier, MD  lidocaine-prilocaine (EMLA) cream Apply 1 application topically as needed. Apply to portacath 1-2 hours prior to use Patient taking differently: Apply 1 application topically as needed. Apply to portacath 1-2 hours prior to use On Tuesday 10/06/16  Yes Owens Shark, NP  methylPREDNISolone (MEDROL DOSEPAK) 4 MG TBPK tablet Take as directed Patient taking differently: Take 4-24 mg by mouth daily. Take as directed 10/13/16  Yes Owens Shark, NP  oxyCODONE (OXY IR/ROXICODONE) 5 MG immediate release tablet Take 1 tablet (5 mg total) by mouth every 4 (four) hours as needed for moderate pain. 10/01/16  Yes Ladell Pier, MD  prochlorperazine (COMPAZINE) 5 MG tablet Take 1 tablet (5 mg total) by mouth every 6 (six) hours as needed for nausea or vomiting. Patient taking differently: Take 5 mg by mouth every  6 (six) hours as needed for nausea or vomiting. nausea 10/06/16  Yes Owens Shark, NP  ondansetron (ZOFRAN) 4 MG tablet Take 1 tablet (4 mg total) by mouth every 8 (eight) hours as needed for nausea. Patient not taking: Reported on 10/06/2016 10/01/16   Ladell Pier, MD  polyethylene glycol [facility] / Floria Raveling) packet Take 17 g by mouth daily as needed for mild constipation. Patient not taking: Reported on 10/15/2016 10/01/16   Ladell Pier, MD    Family History Family History  Problem Relation Age of Onset  . Pancreatic cancer Mother   . Leukemia Father   . Colon cancer Brother     Social History Social  History  Substance Use Topics  . Smoking status: Never Smoker  . Smokeless tobacco: Never Used  . Alcohol use No     Allergies   Patient has no known allergies.   Review of Systems Review of Systems  All other systems reviewed and are negative.    Physical Exam Updated Vital Signs BP (!) 130/93 (BP Location: Left Arm)   Pulse 89   Temp 97.9 F (36.6 C) (Oral)   Resp 16   SpO2 99%   Physical Exam  Constitutional: She is oriented to person, place, and time. She appears well-developed.  Frail, elderly  HENT:  Head: Normocephalic and atraumatic.  Eyes: Conjunctivae and EOM are normal. Pupils are equal, round, and reactive to light.  Neck: Normal range of motion and phonation normal. Neck supple.  Cardiovascular: Normal rate and regular rhythm.   Pulmonary/Chest: Effort normal and breath sounds normal. She exhibits no tenderness.  Abdominal: Soft. She exhibits no distension. There is no tenderness. There is no guarding.  Musculoskeletal: Normal range of motion.  Neurological: She is alert and oriented to person, place, and time. She exhibits normal muscle tone.  Skin: Skin is warm and dry.  Psychiatric: She has a normal mood and affect. Her behavior is normal.  Nursing note and vitals reviewed.    ED Treatments / Results  Labs (all labs ordered are listed, but only abnormal results are displayed) Labs Reviewed - No data to display  EKG  EKG Interpretation None       Radiology No results found.  Procedures Procedures (including critical care time)  Medications Ordered in ED Medications - No data to display   Initial Impression / Assessment and Plan / ED Course  I have reviewed the triage vital signs and the nursing notes.  Pertinent labs & imaging results that were available during my care of the patient were reviewed by me and considered in my medical decision making (see chart for details).     Medications - No data to display  Patient Vitals  for the past 24 hrs:  BP Temp Temp src Pulse Resp SpO2  10/15/16 1156 (!) 130/93 97.9 F (36.6 C) Oral 89 16 99 %  10/15/16 1154 - - - - - 99 %       Final Clinical Impressions(s) / ED Diagnoses   Final diagnoses:  Abdominal pain, unspecified abdominal location  Malignant neoplasm of colon, unspecified part of colon ([place])   Transient abdominal pain resolved on arrival.  Patient is scheduled for follow-up appointment in the cancer center, at 1:15 PM today.  There is no indication for further evaluation or treatment in the ED setting.  Nursing Notes Reviewed/ Care Coordinated Applicable Imaging Reviewed Interpretation of Laboratory Data incorporated into ED treatment  The patient appears reasonably screened and/or  stabilized for discharge and I doubt any other medical condition or other [facility] requiring further screening, evaluation, or treatment in the ED at this time prior to discharge.  Plan: Home Medications-continue usual; Home Treatments-rest; return here if the recommended treatment, does not improve the symptoms; Recommended follow up-oncology as scheduled    New Prescriptions New Prescriptions   No medications on file     Daleen Bo, MD 10/15/16 1256

## 2016-10-16 ENCOUNTER — Other Ambulatory Visit: Payer: Self-pay | Admitting: Oncology

## 2016-10-16 ENCOUNTER — Encounter (HOSPITAL_COMMUNITY): Payer: Self-pay | Admitting: Internal Medicine

## 2016-10-16 DIAGNOSIS — E43 Unspecified severe protein-calorie malnutrition: Secondary | ICD-10-CM

## 2016-10-16 DIAGNOSIS — C787 Secondary malignant neoplasm of liver and intrahepatic bile duct: Secondary | ICD-10-CM | POA: Diagnosis not present

## 2016-10-16 DIAGNOSIS — D638 Anemia in other chronic diseases classified elsewhere: Secondary | ICD-10-CM | POA: Diagnosis not present

## 2016-10-16 DIAGNOSIS — R590 Localized enlarged lymph nodes: Secondary | ICD-10-CM | POA: Diagnosis not present

## 2016-10-16 DIAGNOSIS — N189 Chronic kidney disease, unspecified: Secondary | ICD-10-CM

## 2016-10-16 DIAGNOSIS — E7800 Pure hypercholesterolemia, unspecified: Secondary | ICD-10-CM | POA: Diagnosis not present

## 2016-10-16 DIAGNOSIS — Z79899 Other long term (current) drug therapy: Secondary | ICD-10-CM | POA: Diagnosis not present

## 2016-10-16 DIAGNOSIS — M25461 Effusion, right knee: Secondary | ICD-10-CM

## 2016-10-16 DIAGNOSIS — M25462 Effusion, left knee: Secondary | ICD-10-CM

## 2016-10-16 DIAGNOSIS — Z86010 Personal history of colonic polyps: Secondary | ICD-10-CM | POA: Diagnosis not present

## 2016-10-16 DIAGNOSIS — C189 Malignant neoplasm of colon, unspecified: Secondary | ICD-10-CM

## 2016-10-16 DIAGNOSIS — N390 Urinary tract infection, site not specified: Secondary | ICD-10-CM | POA: Diagnosis not present

## 2016-10-16 DIAGNOSIS — R4182 Altered mental status, unspecified: Secondary | ICD-10-CM | POA: Diagnosis present

## 2016-10-16 DIAGNOSIS — D630 Anemia in neoplastic disease: Secondary | ICD-10-CM

## 2016-10-16 DIAGNOSIS — G9340 Encephalopathy, unspecified: Secondary | ICD-10-CM | POA: Diagnosis not present

## 2016-10-16 DIAGNOSIS — C180 Malignant neoplasm of cecum: Secondary | ICD-10-CM

## 2016-10-16 DIAGNOSIS — Z9221 Personal history of antineoplastic chemotherapy: Secondary | ICD-10-CM | POA: Diagnosis not present

## 2016-10-16 DIAGNOSIS — Z800 Family history of malignant neoplasm of digestive organs: Secondary | ICD-10-CM | POA: Diagnosis not present

## 2016-10-16 DIAGNOSIS — E860 Dehydration: Secondary | ICD-10-CM | POA: Diagnosis not present

## 2016-10-16 DIAGNOSIS — I10 Essential (primary) hypertension: Secondary | ICD-10-CM

## 2016-10-16 DIAGNOSIS — C7800 Secondary malignant neoplasm of unspecified lung: Secondary | ICD-10-CM | POA: Diagnosis not present

## 2016-10-16 DIAGNOSIS — E785 Hyperlipidemia, unspecified: Secondary | ICD-10-CM | POA: Diagnosis not present

## 2016-10-16 DIAGNOSIS — D500 Iron deficiency anemia secondary to blood loss (chronic): Secondary | ICD-10-CM | POA: Diagnosis not present

## 2016-10-16 LAB — VITAMIN B12: Vitamin B-12: 519 pg/mL (ref 180–914)

## 2016-10-16 LAB — BASIC METABOLIC PANEL
Anion gap: 7 (ref 5–15)
BUN: 26 mg/dL — AB (ref 6–20)
CO2: 24 mmol/L (ref 22–32)
CREATININE: 0.91 mg/dL (ref 0.44–1.00)
Calcium: 8.3 mg/dL — ABNORMAL LOW (ref 8.9–10.3)
Chloride: 109 mmol/L (ref 101–111)
GFR calc Af Amer: >60 mL/min (ref >60)
Glucose, Bld: 85 mg/dL (ref 65–99)
Potassium: 4.1 mmol/L (ref 3.5–5.1)
SODIUM: 140 mmol/L (ref 135–145)

## 2016-10-16 LAB — RETICULOCYTES
RBC.: 2.62 MIL/uL — ABNORMAL LOW (ref 3.87–5.11)
RETIC COUNT ABSOLUTE: 44.5 10*3/uL (ref 19.0–186.0)
Retic Ct Pct: 1.7 % (ref 0.4–3.1)

## 2016-10-16 LAB — FERRITIN: FERRITIN: 678 ng/mL — AB (ref 11–307)

## 2016-10-16 LAB — IRON AND TIBC
IRON: 50 ug/dL (ref 28–170)
Saturation Ratios: 30 % (ref 10.4–31.8)
TIBC: 169 ug/dL — AB (ref 250–450)
UIBC: 119 ug/dL

## 2016-10-16 LAB — CBC
HCT: 21.2 % — ABNORMAL LOW (ref 36.0–46.0)
HEMATOCRIT: 30.1 % — AB (ref 36.0–46.0)
Hemoglobin: 6.8 g/dL — CL (ref 12.0–15.0)
Hemoglobin: 9.9 g/dL — ABNORMAL LOW (ref 12.0–15.0)
MCH: 27.1 pg (ref 26.0–34.0)
MCH: 27.4 pg (ref 26.0–34.0)
MCHC: 32.1 g/dL (ref 30.0–36.0)
MCHC: 32.9 g/dL (ref 30.0–36.0)
MCV: 84.5 fL (ref 78.0–100.0)
MCV: 83.4 fL (ref 78.0–100.0)
PLATELETS: 336 10*3/uL (ref 150–400)
PLATELETS: 306 10*3/uL (ref 150–400)
RBC: 2.51 MIL/uL — ABNORMAL LOW (ref 3.87–5.11)
RBC: 3.61 MIL/uL — ABNORMAL LOW (ref 3.87–5.11)
RDW: 22.1 % — AB (ref 11.5–15.5)
RDW: 18.9 % — AB (ref 11.5–15.5)
WBC: 9.3 10*3/uL (ref 4.0–10.5)
WBC: 6.7 10*3/uL (ref 4.0–10.5)

## 2016-10-16 LAB — PREPARE RBC (CROSSMATCH)

## 2016-10-16 LAB — AMMONIA: Ammonia: 41 umol/L — ABNORMAL HIGH (ref 9–35)

## 2016-10-16 LAB — FOLATE: Folate: 7.3 ng/mL (ref >5.9)

## 2016-10-16 MED ORDER — FUROSEMIDE 10 MG/ML IJ SOLN
20.0000 mg | Freq: Once | INTRAMUSCULAR | Status: AC
  Administered 2016-10-16: 20 mg via INTRAVENOUS
  Filled 2016-10-16: qty 2

## 2016-10-16 MED ORDER — ACETAMINOPHEN 650 MG RE SUPP: 650.0000 mg | Freq: Four times a day (QID) | RECTAL | Status: DC | PRN

## 2016-10-16 MED ORDER — DIPHENHYDRAMINE HCL 25 MG PO CAPS
25.0000 mg | ORAL_CAPSULE | Freq: Once | ORAL | Status: AC
  Administered 2016-10-16: 25 mg via ORAL
  Filled 2016-10-16: qty 1

## 2016-10-16 MED ORDER — LACTATED RINGERS IV SOLN
INTRAVENOUS | Status: DC
  Administered 2016-10-16 (×2): via INTRAVENOUS

## 2016-10-16 MED ORDER — SODIUM CHLORIDE 0.9 % IV SOLN: Freq: Once | INTRAVENOUS | Status: DC

## 2016-10-16 MED ORDER — LACTULOSE 10 GM/15ML PO SOLN
30.0000 g | Freq: Two times a day (BID) | ORAL | Status: DC
  Administered 2016-10-16 – 2016-10-17 (×2): 30 g via ORAL
  Filled 2016-10-16 (×3): qty 45

## 2016-10-16 MED ORDER — AMLODIPINE BESYLATE 5 MG PO TABS
5.0000 mg | ORAL_TABLET | Freq: Every day | ORAL | Status: DC
  Administered 2016-10-16 – 2016-10-17 (×2): 5 mg via ORAL
  Filled 2016-10-16 (×2): qty 1

## 2016-10-16 MED ORDER — FERROUS SULFATE 325 (65 FE) MG PO TABS
325.0000 mg | ORAL_TABLET | Freq: Every day | ORAL | Status: DC
  Administered 2016-10-16 – 2016-10-17 (×2): 325 mg via ORAL
  Filled 2016-10-16 (×2): qty 1

## 2016-10-16 MED ORDER — ADULT MULTIVITAMIN W/MINERALS CH
1.0000 | ORAL_TABLET | Freq: Every day | ORAL | Status: DC
  Administered 2016-10-16 – 2016-10-17 (×2): 1 via ORAL
  Filled 2016-10-16 (×2): qty 1

## 2016-10-16 MED ORDER — OXYCODONE HCL 5 MG PO TABS
5.0000 mg | ORAL_TABLET | ORAL | Status: DC | PRN
  Administered 2016-10-16 – 2016-10-17 (×4): 5 mg via ORAL
  Filled 2016-10-16 (×5): qty 1

## 2016-10-16 MED ORDER — ACETAMINOPHEN 325 MG PO TABS
650.0000 mg | ORAL_TABLET | Freq: Once | ORAL | Status: AC
  Administered 2016-10-16: 650 mg via ORAL
  Filled 2016-10-16: qty 2

## 2016-10-16 MED ORDER — ONDANSETRON HCL 4 MG PO TABS: 4.0000 mg | ORAL_TABLET | Freq: Four times a day (QID) | ORAL | Status: DC | PRN

## 2016-10-16 MED ORDER — ENOXAPARIN SODIUM 30 MG/0.3ML ~~LOC~~ SOLN
30.0000 mg | Freq: Every day | SUBCUTANEOUS | Status: DC
  Administered 2016-10-16 – 2016-10-17 (×2): 30 mg via SUBCUTANEOUS
  Filled 2016-10-16 (×2): qty 0.3

## 2016-10-16 MED ORDER — DEXTROSE 5 % IV SOLN
1.0000 g | INTRAVENOUS | Status: DC
  Administered 2016-10-16 – 2016-10-17 (×2): 1 g via INTRAVENOUS
  Filled 2016-10-16 (×2): qty 10

## 2016-10-16 MED ORDER — ACETAMINOPHEN 325 MG PO TABS: 650.0000 mg | ORAL_TABLET | Freq: Four times a day (QID) | ORAL | Status: DC | PRN

## 2016-10-16 MED ORDER — ONDANSETRON HCL 4 MG/2ML IJ SOLN: 4.0000 mg | Freq: Four times a day (QID) | INTRAMUSCULAR | Status: DC | PRN

## 2016-10-16 MED ORDER — DOCUSATE SODIUM 100 MG PO CAPS
100.0000 mg | ORAL_CAPSULE | Freq: Two times a day (BID) | ORAL | Status: DC
  Administered 2016-10-16 (×2): 100 mg via ORAL
  Filled 2016-10-16 (×3): qty 1

## 2016-10-16 MED ORDER — SODIUM CHLORIDE 0.9 % IV SOLN
Freq: Once | INTRAVENOUS | Status: AC
  Administered 2016-10-16: 12:00:00 via INTRAVENOUS

## 2016-10-16 NOTE — Progress Notes (Signed)
Rx Brief note:  Lovenox   Wt=44 kg, CrCl~39 ml/min Rx adjusted Lovenox to 30 mg daily in pt with wt<45 kg.  Thanks Dorrene German 10/16/2016 1:57 AM

## 2016-10-16 NOTE — Progress Notes (Signed)
IP PROGRESS NOTE  Subjective:   Ms. Lisa Guerra completed a second cycle of FOLFOX beginning 10/13/2016. She was seen in the emergency room on 10/15/2016 with chest pain. She was treated with a dose of fentanyl while being transported to the ER by EMS. The pain resolved and has not returned. She was evaluated by the nursing staff at the [place] yesterday prior to the pump disconnect and had no more pain. She presents emergency room early this morning with confusion. She was admitted for further evaluation. A brain CT showed no acute change. She denies pain at present. She had an episode of small volume emesis this morning.  She had bilateral knee effusions with tenderness and erythema at the right knee when we saw her on 10/13/2016. She was prescribed a Medrol Dosepak. Knee pain has resolved. Objective: Vital signs in last 24 hours: Blood pressure 127/79, pulse 72, temperature 98.6 F (37 C), temperature source Oral, resp. rate 16, height 5\' 5"  (1.651 m), weight 97 lb (44 kg), SpO2 100 %.  Intake/Output from previous day: 03/22 0701 - 03/23 0700 In: 192.5 [I.V.:192.5] Out: -   Physical Exam:  Lungs: Clear bilaterally Cardiac: Regular rate and rhythm Abdomen: No hepatosplenomegaly, nontender Extremities: No leg edema Neurologic: Alert and oriented, follows commands Musculoskeletal: No anterior chest wall tenderness, minimal left knee effusion, no erythema or effusion at the right knee  Portacath/PICC-without erythema  Lab Results:  Recent Labs  10/15/16 2102 10/16/16 0600  WBC 9.9 9.3  HGB 7.1* 6.8*  HCT 22.3* 21.2*  PLT 391 336    BMET  Recent Labs  10/15/16 2102 10/16/16 0600  NA 138 140  K 4.3 4.1  CL 108 109  CO2 23 24  GLUCOSE 105* 85  BUN 27* 26*  CREATININE 0.96 0.91  CALCIUM 8.4* 8.3*    Studies/Results: Dg Chest 2 View  Result Date: 10/15/2016 CLINICAL DATA:  Confusion EXAM: CHEST  2 VIEW COMPARISON:  09/24/2016 FINDINGS: Right-sided central  venous port tip overlies the SVC. Low lung volumes with mild bibasilar atelectasis. There are tiny pleural effusions. Stable cardiomediastinal silhouette. No pneumothorax. The previously noted small pulmonary nodules are difficult to appreciate on the current study. IMPRESSION: Low lung volumes with tiny pleural effusions and right greater than left bibasilar atelectasis. Electronically Signed   By: Donavan Foil M.D.   On: 10/15/2016 21:39   Ct Head Wo Contrast  Result Date: 10/15/2016 CLINICAL DATA:  Confusion EXAM: CT HEAD WITHOUT CONTRAST TECHNIQUE: Contiguous axial images were obtained from the base of the skull through the vertex without intravenous contrast. COMPARISON:  None. FINDINGS: Brain: No acute territorial infarction, intracranial hemorrhage or focal mass lesion is visualized. Mild periventricular white matter small vessel change. Mild atrophy. No midline shift. Vascular: No hyperdense vessels.  Carotid artery calcifications. Skull: Normal. Negative for fracture or focal lesion. Sinuses/Orbits: No acute finding. Other: None IMPRESSION: No CT evidence for acute intracranial abnormality. Electronically Signed   By: Donavan Foil M.D.   On: 10/15/2016 22:09    Medications: I have reviewed the patient's current medications.  Assessment/Plan:  1. Metastatic colon cancer-adenocarcinoma on biopsy of a cecal mass 09/07/2016  Separate polypoid mass at the sigmoid colon, biopsy to 15 2018 revealed a tubular adenoma with at least high-grade dysplasia  Staging CTs of the chest, abdomen, and pelvis 09/08/2016-extensive liver and lung metastases, bulky abdominal/retroperitoneal lymphadenopathy  Daryel Gerald elevated CEA  Ultrasound-guided biopsy of a liver lesion 09/25/2016 confirmed metastatic colon cancer  Cycle 1 FOLFOX 09/29/2016  Cycle 2 FOLFOX 10/13/2016  2. Iron deficiency anemia secondary to #1  Red cell transfusion 09/24/2016   IV iron 09/27/2016  3. Chronic renal  insufficiency  4. Polyps noted on the colonoscopy 09/07/2016 including a polyp with high-grade dysplasia  5. Hypertension  6. Family history of multiple cancers including pancreas cancer  7. Lipidemia  8. Hyperbilirubinemia secondary to extensive metastatic disease involving the liver  CT abdomen 09/29/2016 with stable mild intrahepatic biliary dilatation.  Bilirubin improved 10/06/2016, 10/13/2016  9.   Bilateral knee effusions left greater than right, erythema/tenderness overlying the right patella 10/13/2016-improved  10. Admission with altered mental status 10/15/2016  She was admitted this morning with altered mental status. Her mental status appears at baseline at present. The altered mental status may have been related to steroids and pain medication given over the past few days. She has persistent severe anemia secondary to iron deficiency, bleeding from the colon tumor, and chemotherapy.  There is clinical evidence of a response to chemotherapy with improvement in the right upper quadrant pain and liver enzymes.  Recommendations: 1. Agree with red blood cell transfusion, continue iron 2. Stable for discharge after the red cell transfusion from an oncology standpoint 3. Follow-up as scheduled at the [facility] 10/27/2016 4. Please call Oncology as needed     Betsy Coder, MD   10/16/2016, 2:29 PM

## 2016-10-16 NOTE — Progress Notes (Signed)
Initial Nutrition Assessment  DOCUMENTATION CODES:   Severe malnutrition in context of chronic illness, Severe malnutrition in context of social or environmental circumstances  INTERVENTION:  - Will order Mighty Shake once/day, this supplement provides 500 kcal and 23 grams of protein. - Will order Magic Cup BID with meals, each supplement provides 290 kcal and 9 grams of protein - Will order daily multivitamin with minerals.   NUTRITION DIAGNOSIS:   Malnutrition related to chronic illness as evidenced by severe depletion of body fat, severe depletion of muscle mass.  GOAL:   Patient will meet greater than or equal to 90% of their needs  MONITOR:   PO intake, Supplement acceptance, Weight trends, Labs  REASON FOR ASSESSMENT:   Other (Comment) (Underweight BMI)  ASSESSMENT:   71 y.o. female with medical history significant of colon cancer s/p cycle 1 FOLFOX on 09/29/16 and cycle 2 on 10/13/16; anemia of chronic disease; HTN; hyperbilirubinemia 2* to metastatic colon cancer involving the liver; and severe malnutrition presenting with AMS.  Patient reports "I was hurting around my heart."  Slight confusion.  Pt seen d/t underweight BMI. Pt with hx of colon cancer and has had 2 cycles of FOLFOX (3/6 and 3/20). Breakfast tray still at bedside at this time and pt had eaten an orange a cup of grapes; reports that fruit sounded good to her. Pt's responses are often brief in nature and it is difficult to obtain all information at this time so will ask further questions at follow-up. She had one episode of emesis prior to eating this AM and had several episodes of vomiting following each of her cycles of chemo. Pt does not feel nauseated prior to these episodes. She denies nausea or abdominal pain at this time. She is not sure whether her appetite has been good or not or if it is at baseline for her. Asked pt about consuming oral nutrition supplements PTA and she stated that she was currently  drinking orange juice with medication provided by RN; unable to obtain an answer. Noted pt had Magic Cup and [employer] during previous admission earlier this month.  Physical assessment shows mainly severe muscle and severe fat wasting, no edema. Pt reports 1-2 lb weight fluctuations since beginning chemo. Per chart review, she has lost 5 lbs (5% body weight) over the course of this month which is significant for time frame.  Medications reviewed; 100 mg Colace BID, 325 mg ferrous sulfate/day, 20 mg IV Lasix x1 dose today, 30 g lactulose BID. Labs reviewed; BUN: 26 mg/dL, Ca: 8.3 mg/dL, ammonia: 41 umol/L.  IVF: LR @ 75 mL/hr.    Diet Order:  Diet regular Room service appropriate? Yes; Fluid consistency: Thin  Skin:  Reviewed, no issues  Last BM:  3/22  Height:   Ht Readings from Last 1 Encounters:  10/16/16 5\' 5"  (1.651 m)    Weight:   Wt Readings from Last 1 Encounters:  10/16/16 97 lb (44 kg)    Ideal Body Weight:  56.82 kg  BMI:  Body mass index is 16.14 kg/m.  Estimated Nutritional Needs:   Kcal:  1540-1760 (35-40 kcal/kg)  Protein:  70-80 grams (1.6-1.8 grams/kg)  Fluid:  >/= 1.6 L/day  EDUCATION NEEDS:   No education needs identified at this time    Jarome Matin, MS, RD, LDN, CNSC Inpatient Clinical Dietitian Pager # [phone] After hours/weekend pager # [phone]

## 2016-10-16 NOTE — ED Provider Notes (Signed)
[facility] DEPT Provider Note   CSN: [id number] Arrival date & time: 10/15/16  1944     History   Chief Complaint Chief Complaint  Patient presents with  . Altered Mental Status    HPI Lisa Guerra is a 71 y.o. female.  HPI  Patient presents with confusion. Reportedly started after she got home today. She was seen in the ER earlier today with a chest pain episode. It resolved with some fentanyl. Then went to cancer center and had her chemotherapy pump removed. After she got home she was confused. Normally alert and oriented 4. Was asking repetitive questions and more confused. Had been complaining of some pain but was too confused to take her pain medicine that her son was going to give her.  Past Medical History:  Diagnosis Date  . Anemia   . Cancer ([place])   . Colon cancer ([employer])   . High cholesterol   . Hypertension   . Low hemoglobin     Patient Active Problem List   Diagnosis Date Noted  . Goals of care, counseling/discussion 09/28/2016  . Iron deficiency anemia due to chronic blood loss 09/27/2016  . Protein-calorie malnutrition, severe 09/25/2016  . Anemia of chronic disease 09/24/2016  . Elevated LFTs 09/21/2016  . Chest pain 09/20/2016  . Metastatic cancer ([place]) 09/20/2016  . Anemia   . Essential hypertension   . Colon cancer ([place]) 09/14/2016    Past Surgical History:  Procedure Laterality Date  . CESAREAN SECTION     x2   . COLONOSCOPY N/A 09/07/2016   Dr. Wynetta Emery with [name]: two 5 mm polyps in descending colon, large tumor consistent with adenocarcinoma filled cecum/ascending colon, large multi-lobulated ulcerated polypoid lesion at recto-sigmoid junction 20 cm from anal verge.   . IR GENERIC HISTORICAL  09/24/2016   IR FLUORO GUIDE PORT INSERTION RIGHT 09/24/2016 Arne Cleveland, MD [facility]  . IR GENERIC HISTORICAL  09/24/2016   IR US GUIDE VASC ACCESS RIGHT 09/24/2016 Arne Cleveland, MD [facility]  . IR GENERIC HISTORICAL  09/24/2016   IR  US GUIDE BX ASP/DRAIN 09/24/2016 Arne Cleveland, MD [facility]  . TONSILLECTOMY    . TUBAL LIGATION      OB History    No data available       Home Medications    Prior to Admission medications   Medication Sig Start Date End Date Taking? Authorizing Provider  amLODipine (NORVASC) 5 MG tablet Take 5 mg by mouth daily.   Yes Historical Provider, MD  ferrous sulfate 325 (65 FE) MG tablet Take 1 tablet (325 mg total) by mouth 3 (three) times daily with meals. Patient taking differently: Take 325 mg by mouth daily with breakfast.  09/14/16  Yes Ladell Pier, MD  lidocaine-prilocaine (EMLA) cream Apply 1 application topically as needed. Apply to portacath 1-2 hours prior to use Patient taking differently: Apply 1 application topically as needed. Apply to portacath 1-2 hours prior to use On Tuesday 10/06/16  Yes Owens Shark, NP  methylPREDNISolone (MEDROL DOSEPAK) 4 MG TBPK tablet Take as directed Patient taking differently: Take 4-24 mg by mouth daily. Take as directed 10/13/16  Yes Owens Shark, NP  oxyCODONE (OXY IR/ROXICODONE) 5 MG immediate release tablet Take 1 tablet (5 mg total) by mouth every 4 (four) hours as needed for moderate pain. 10/01/16  Yes Ladell Pier, MD  prochlorperazine (COMPAZINE) 5 MG tablet Take 1 tablet (5 mg total) by mouth every 6 (six) hours as needed  for nausea or vomiting. Patient taking differently: Take 5 mg by mouth every 6 (six) hours as needed for nausea or vomiting. nausea 10/06/16  Yes Owens Shark, NP  ondansetron (ZOFRAN) 4 MG tablet Take 1 tablet (4 mg total) by mouth every 8 (eight) hours as needed for nausea. Patient not taking: Reported on 10/06/2016 10/01/16   Ladell Pier, MD  polyethylene glycol [facility] / Floria Raveling) packet Take 17 g by mouth daily as needed for mild constipation. Patient not taking: Reported on 10/15/2016 10/01/16   Ladell Pier, MD    Family History Family History  Problem Relation Age of Onset  . Pancreatic cancer  Mother   . Leukemia Father   . Colon cancer Brother     Social History Social History  Substance Use Topics  . Smoking status: Never Smoker  . Smokeless tobacco: Never Used  . Alcohol use No     Allergies   Patient has no known allergies.   Review of Systems Review of Systems  Unable to perform ROS: Mental status change  Constitutional: Positive for appetite change.     Physical Exam Updated Vital Signs BP (!) 145/84 (BP Location: Right Arm)   Pulse 79   Temp 98 F (36.7 C) (Oral)   Resp 19   SpO2 98%   Physical Exam  Constitutional: She appears well-developed.  HENT:  Head: Atraumatic.  Eyes: EOM are normal.  Neck: Neck supple.  Cardiovascular: Normal rate.   Pulmonary/Chest: Effort normal.  Abdominal: Soft.  Mild right upper quadrant tenderness without rebound or guarding.  Musculoskeletal: She exhibits no edema.  Neurological: She is alert.  Patient is awake but somewhat confused. Recognizes family members amplified some history but she cannot tell me the date or year or who the president is. Will be all extremities.  Skin: Skin is warm. Capillary refill takes less than 2 seconds.     ED Treatments / Results  Labs (all labs ordered are listed, but only abnormal results are displayed) Labs Reviewed  COMPREHENSIVE METABOLIC PANEL - Abnormal; Notable for the following:       Result Value   Glucose, Bld 105 (*)    BUN 27 (*)    Calcium 8.4 (*)    Total Protein 6.1 (*)    Albumin 2.1 (*)    AST 44 (*)    Alkaline Phosphatase 430 (*)    Total Bilirubin 3.1 (*)    GFR calc non Af Amer 59 (*)    All other components within normal limits  URINALYSIS, ROUTINE W REFLEX MICROSCOPIC - Abnormal; Notable for the following:    Hgb urine dipstick SMALL (*)    Ketones, ur 5 (*)    Nitrite POSITIVE (*)    Bacteria, UA MANY (*)    Squamous Epithelial / LPF 0-5 (*)    All other components within normal limits  CBC WITH DIFFERENTIAL/PLATELET - Abnormal; Notable  for the following:    RBC 2.65 (*)    Hemoglobin 7.1 (*)    HCT 22.3 (*)    RDW 22.1 (*)    Neutro Abs 9.0 (*)    Lymphs Abs 0.5 (*)    All other components within normal limits  ETHANOL  RAPID URINE DRUG SCREEN, HOSP PERFORMED  TROPONIN I    EKG  EKG Interpretation None       Radiology Dg Chest 2 View  Result Date: 10/15/2016 CLINICAL DATA:  Confusion EXAM: CHEST  2 VIEW COMPARISON:  09/24/2016 FINDINGS: Right-sided  central venous port tip overlies the SVC. Low lung volumes with mild bibasilar atelectasis. There are tiny pleural effusions. Stable cardiomediastinal silhouette. No pneumothorax. The previously noted small pulmonary nodules are difficult to appreciate on the current study. IMPRESSION: Low lung volumes with tiny pleural effusions and right greater than left bibasilar atelectasis. Electronically Signed   By: Donavan Foil M.D.   On: 10/15/2016 21:39   Ct Head Wo Contrast  Result Date: 10/15/2016 CLINICAL DATA:  Confusion EXAM: CT HEAD WITHOUT CONTRAST TECHNIQUE: Contiguous axial images were obtained from the base of the skull through the vertex without intravenous contrast. COMPARISON:  None. FINDINGS: Brain: No acute territorial infarction, intracranial hemorrhage or focal mass lesion is visualized. Mild periventricular white matter small vessel change. Mild atrophy. No midline shift. Vascular: No hyperdense vessels.  Carotid artery calcifications. Skull: Normal. Negative for fracture or focal lesion. Sinuses/Orbits: No acute finding. Other: None IMPRESSION: No CT evidence for acute intracranial abnormality. Electronically Signed   By: Donavan Foil M.D.   On: 10/15/2016 22:09    Procedures Procedures (including critical care time)  Medications Ordered in ED Medications  sodium chloride 0.9 % bolus 500 mL (500 mLs Intravenous New Bag/Given 10/15/16 2310)     Initial Impression / Assessment and Plan / ED Course  I have reviewed the triage vital signs and the nursing  notes.  Pertinent labs & imaging results that were available during my care of the patient were reviewed by me and considered in my medical decision making (see chart for details).     Patient with confusion. 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 Head CT reassuring. Lab work overall reassuring. BUN is mildly elevated patient could be dehydrated since she has had decreased oral intake. She also has been on steroids recently and could be a steroid-related encephalopathy. Will admit to internal medicine. Hepatic encephalopathy considered but felt less likely.  Final Clinical Impressions(s) / ED Diagnoses   Final diagnoses:  Altered mental status, unspecified altered mental status type    New Prescriptions New Prescriptions   No medications on file     Davonna Belling, MD 10/16/16 [phone]

## 2016-10-16 NOTE — Progress Notes (Signed)
OT Cancellation Note  Patient Details Name: CAREENA DEGRAFFENREID MRN: [id number] DOB: 03/22/1946   Cancelled Treatment:    Reason Eval/Treat Not Completed: Other (comment).  Noted pt is to have blood transfusion.  If pt remains here beyond today, we will try to check back.  Quinn Bartling 10/16/2016, 2:44 PM  Lesle Chris, OTR/L [phone] 10/16/2016

## 2016-10-16 NOTE — H&P (Signed)
History and Physical    Lisa Guerra SWF:[id number] DOB: June 02, 1946 DOA: 10/15/2016  PCP: [name], MD Consultants:  Benay Spice - oncology Patient coming from: home - lives with husband; NOK: husband and son, [phone]  Chief Complaint: AMS  HPI: Lisa Guerra is a 71 y.o. female with medical history significant of colon cancer s/p cycle 1 FOLFOX on 09/29/16 and cycle 2 on 10/13/16; anemia of chronic disease; HTN; hyperbilirubinemia 2* to metastatic colon cancer involving the liver; and severe malnutrition presenting with AMS.  Patient reports "I was hurting around my heart."  Slight confusion.  No current pain.    She was seen in the ER earlier in the day on 3/22 for abdominal pain and received Fentanyl by EMS which resolved the pain.  She was discharged to the cancer center and then sent home.  She started getting confused about 6:30pm 3/22 and was found on the floor, although her son does not think she fell.    At the time of my evaluation, the patient was accompanied only by her husband and he was not a reliable historian either.   ED Course:  Head CT reassuring and so was blood work.  Concern for mild BUN elevation - ?dehydration as cause.  Steroid-related or hepatic encephalopathy were also considerations.  Review of Systems: As per HPI; otherwise 10 point review of systems reviewed and negative. This was limited by the patient's somnolence and her very poor ability to provide history.  Ambulatory Status:  Ambulates without assistance  Past Medical History:  Diagnosis Date  . Anemia   . Colon cancer ([place])   . High cholesterol   . Hypertension   . Low hemoglobin     Past Surgical History:  Procedure Laterality Date  . CESAREAN SECTION     x2   . COLONOSCOPY N/A 09/07/2016   Dr. Wynetta Emery with [name]: two 5 mm polyps in descending colon, large tumor consistent with adenocarcinoma filled cecum/ascending colon, large multi-lobulated ulcerated polypoid lesion at  recto-sigmoid junction 20 cm from anal verge.   . IR GENERIC HISTORICAL  09/24/2016   IR FLUORO GUIDE PORT INSERTION RIGHT 09/24/2016 Arne Cleveland, MD [facility]  . IR GENERIC HISTORICAL  09/24/2016   IR US GUIDE VASC ACCESS RIGHT 09/24/2016 Arne Cleveland, MD [facility]  . IR GENERIC HISTORICAL  09/24/2016   IR US GUIDE BX ASP/DRAIN 09/24/2016 Arne Cleveland, MD [facility]  . TONSILLECTOMY    . TUBAL LIGATION      Social History   Social History  . Marital status: Married    Spouse name: N/A  . Number of children: N/A  . Years of education: N/A   Occupational History  . retired    Social History Main Topics  . Smoking status: Never Smoker  . Smokeless tobacco: Never Used  . Alcohol use No  . Drug use: No  . Sexual activity: Yes   Other Topics Concern  . Not on file   Social History Narrative  . No narrative on file    No Known Allergies  Family History  Problem Relation Age of Onset  . Pancreatic cancer Mother   . Leukemia Father   . Colon cancer Brother     Prior to Admission medications   Medication Sig Start Date End Date Taking? Authorizing Provider  amLODipine (NORVASC) 5 MG tablet Take 5 mg by mouth daily.   Yes Historical Provider, MD  ferrous sulfate 325 (65 FE) MG tablet Take 1 tablet (325 mg  total) by mouth 3 (three) times daily with meals. Patient taking differently: Take 325 mg by mouth daily with breakfast.  09/14/16  Yes Ladell Pier, MD  lidocaine-prilocaine (EMLA) cream Apply 1 application topically as needed. Apply to portacath 1-2 hours prior to use Patient taking differently: Apply 1 application topically as needed. Apply to portacath 1-2 hours prior to use On Tuesday 10/06/16  Yes Owens Shark, NP  methylPREDNISolone (MEDROL DOSEPAK) 4 MG TBPK tablet Take as directed Patient taking differently: Take 4-24 mg by mouth daily. Take as directed 10/13/16  Yes Owens Shark, NP  oxyCODONE (OXY IR/ROXICODONE) 5 MG immediate release tablet Take 1  tablet (5 mg total) by mouth every 4 (four) hours as needed for moderate pain. 10/01/16  Yes Ladell Pier, MD  prochlorperazine (COMPAZINE) 5 MG tablet Take 1 tablet (5 mg total) by mouth every 6 (six) hours as needed for nausea or vomiting. Patient taking differently: Take 5 mg by mouth every 6 (six) hours as needed for nausea or vomiting. nausea 10/06/16  Yes Owens Shark, NP  ondansetron (ZOFRAN) 4 MG tablet Take 1 tablet (4 mg total) by mouth every 8 (eight) hours as needed for nausea. Patient not taking: Reported on 10/06/2016 10/01/16   Ladell Pier, MD  polyethylene glycol [facility] / Floria Raveling) packet Take 17 g by mouth daily as needed for mild constipation. Patient not taking: Reported on 10/15/2016 10/01/16   Ladell Pier, MD    Physical Exam: Vitals:   10/15/16 2002 10/15/16 2338  BP: 135/87 (!) 145/84  Pulse: 77 79  Resp: 17 19  Temp: 98 F (36.7 C)   TempSrc: Oral   SpO2: 97% 98%     General: Appears calm and comfortable and is NAD Eyes:  PERRL, EOMI, normal lids, iris ENT:  grossly normal hearing, lips & tongue, mmm Neck:  no LAD, masses or thyromegaly Cardiovascular:  RRR, no m/r/g. No LE edema.  Respiratory:  CTA bilaterally, no w/r/r. Normal respiratory effort. Abdomen:  soft, ntnd, NABS Skin:  no rash or induration seen on limited exam Musculoskeletal:  grossly normal tone BUE/BLE, good ROM, no bony abnormality Psychiatric:  grossly normal mood and affect, speech fluent but clearly confused, AOx2 Neurologic:  CN 2-12 grossly intact, moves all extremities in coordinated fashion, sensation intact  Labs on Admission: I have personally reviewed following labs and imaging studies  CBC:  Recent Labs Lab 10/13/16 0842 10/15/16 2102  WBC 16.7* 9.9  NEUTROABS 14.6* 9.0*  HGB 7.9* 7.1*  HCT 24.3* 22.3*  MCV 83.1 84.2  PLT 508* 102   Basic Metabolic Panel:  Recent Labs Lab 10/13/16 0842 10/15/16 2102  NA 140 138  K 4.2 4.3  CL  --  108  CO2 22 23    GLUCOSE 89 105*  BUN 18.2 27*  CREATININE 0.9 0.96  CALCIUM 8.7 8.4*   GFR: Estimated Creatinine Clearance: 41.1 mL/min (by C-G formula based on SCr of 0.96 mg/dL). Liver Function Tests:  Recent Labs Lab 10/13/16 0842 10/15/16 2102  AST 38* 44*  ALT 38 36  ALKPHOS 482* 430*  BILITOT 2.99* 3.1*  PROT 6.6 6.1*  ALBUMIN 2.0* 2.1*   No results for input(s): LIPASE, AMYLASE in the last 168 hours. No results for input(s): AMMONIA in the last 168 hours. Coagulation Profile: No results for input(s): INR, PROTIME in the last 168 hours. Cardiac Enzymes:  Recent Labs Lab 10/15/16 2142  TROPONINI <0.03   BNP (last 3 results) No  results for input(s): PROBNP in the last 8760 hours. HbA1C: No results for input(s): HGBA1C in the last 72 hours. CBG: No results for input(s): GLUCAP in the last 168 hours. Lipid Profile: No results for input(s): CHOL, HDL, LDLCALC, TRIG, CHOLHDL, LDLDIRECT in the last 72 hours. Thyroid Function Tests: No results for input(s): TSH, T4TOTAL, FREET4, T3FREE, THYROIDAB in the last 72 hours. Anemia Panel: No results for input(s): VITAMINB12, FOLATE, FERRITIN, TIBC, IRON, RETICCTPCT in the last 72 hours. Urine analysis:    Component Value Date/Time   COLORURINE YELLOW 10/15/2016 2308   APPEARANCEUR CLEAR 10/15/2016 2308   LABSPEC 1.012 10/15/2016 2308   PHURINE 5.0 10/15/2016 2308   GLUCOSEU NEGATIVE 10/15/2016 2308   HGBUR SMALL (A) 10/15/2016 2308   BILIRUBINUR NEGATIVE 10/15/2016 2308   KETONESUR 5 (A) 10/15/2016 2308   PROTEINUR NEGATIVE 10/15/2016 2308   NITRITE POSITIVE (A) 10/15/2016 2308   LEUKOCYTESUR NEGATIVE 10/15/2016 2308    Creatinine Clearance: Estimated Creatinine Clearance: 41.1 mL/min (by C-G formula based on SCr of 0.96 mg/dL).  Sepsis Labs: @LABRCNTIP (procalcitonin:4,lacticidven:4) )No results found for this or any previous visit (from the past 240 hour(s)).   Radiological Exams on Admission: Dg Chest 2 View  Result  Date: 10/15/2016 CLINICAL DATA:  Confusion EXAM: CHEST  2 VIEW COMPARISON:  09/24/2016 FINDINGS: Right-sided central venous port tip overlies the SVC. Low lung volumes with mild bibasilar atelectasis. There are tiny pleural effusions. Stable cardiomediastinal silhouette. No pneumothorax. The previously noted small pulmonary nodules are difficult to appreciate on the current study. IMPRESSION: Low lung volumes with tiny pleural effusions and right greater than left bibasilar atelectasis. Electronically Signed   By: Donavan Foil M.D.   On: 10/15/2016 21:39   Ct Head Wo Contrast  Result Date: 10/15/2016 CLINICAL DATA:  Confusion EXAM: CT HEAD WITHOUT CONTRAST TECHNIQUE: Contiguous axial images were obtained from the base of the skull through the vertex without intravenous contrast. COMPARISON:  None. FINDINGS: Brain: No acute territorial infarction, intracranial hemorrhage or focal mass lesion is visualized. Mild periventricular white matter small vessel change. Mild atrophy. No midline shift. Vascular: No hyperdense vessels.  Carotid artery calcifications. Skull: Normal. Negative for fracture or focal lesion. Sinuses/Orbits: No acute finding. Other: None IMPRESSION: No CT evidence for acute intracranial abnormality. Electronically Signed   By: Donavan Foil M.D.   On: 10/15/2016 22:09    EKG: Independently reviewed.  NSR with rate 77; Abnormal R-wave progression, early transition with no evidence of acute ischemia  Assessment/Plan Principal Problem:   Acute encephalopathy Active Problems:   Colon cancer ([facility])   Anemia   Elevated LFTs   Protein-calorie malnutrition, severe   Acute encephalopathy -Uncertain etiology -CT negative -Mild dehydration is a consideration due to elevated BUN (BUN 27, creatinine 0.96; prior 18.2/0.9), but this could also indicate an upper GI bleed (Hgb slightly less at 7.1 than 7.9 on 3/20) or a number of other considerations -UA is mildly abnormal (many bacteria,  +mucous, positive nitrite), will culture -UDS negative, ETOH negative -Will check ammonia level -It is also not entirely clear what the patient's baseline is; this could be a mild delirium associated with underlying mild dementia -It is possible that this is a delayed reaction to the Fentanyl she received in the ER earlier today -It could also be steroid-induced encephalopathy (will hold prednisone for now) -Will observe overnight without significant intervention  -If improved in the AM, she is likely ok for discharge -If she continues to be altered, additional evaluation may need to be  considered  Colon cancer with mets to liver -Alk Phos 430, stable -Bili 3.1, stable -Will add Dr. Benay Spice to the treatment team, he may need to be called in the AM  Malnutrition -Albumin 2.1, stable  DVT prophylaxis: Lovenox  Code Status: Full - confirmed with patient/family Family Communication: Husband present throughout evaluation Disposition Plan:  Home once clinically improved Consults called: Dr. Benay Spice was added to the treatment team  Admission status: It is my clinical opinion that referral for OBSERVATION is reasonable and necessary in this patient based on the above information provided. The aforementioned taken together are felt to place the patient at high risk for further clinical deterioration. However it is anticipated that the patient may be medically stable for discharge from the hospital within 24 to 48 hours.     Karmen Bongo MD [facility]  If 7PM-7AM, please contact night-coverage [email or web address] Password [email or web address]  10/16/2016, 12:51 AM

## 2016-10-16 NOTE — Progress Notes (Signed)
I have seen and assessed patient and agree with Dr. Lorin Mercy assessment and plan. Patient is a pleasant 71 year old female history of colon cancer status post 2 cycles of chemotherapy last 1 on 10/13/2016, anemia of chronic disease, hypertension, hyperbilirubinemia secondary to metastatic colon cancer of the liver, severe malnutrition that presented with altered mental status. Workup so far consistent with a probable UTI, hepatic encephalopathy, dehydration. Patient with some clinical improvement however not at baseline in terms of encephalopathy with poor oral intake. Patient noted to have a drop in her hemoglobin currently at 6.8 from 9.6 on 09/25/2016. Likely chemotherapy induced. Will place patient on IV antibiotics  Will give lactulose. While urine cultures are pending. Transfuse 2 units packed red blood cells. Check an anemia panel. Follow H&H.

## 2016-10-17 DIAGNOSIS — R7989 Other specified abnormal findings of blood chemistry: Secondary | ICD-10-CM

## 2016-10-17 DIAGNOSIS — G9340 Encephalopathy, unspecified: Secondary | ICD-10-CM | POA: Diagnosis not present

## 2016-10-17 DIAGNOSIS — D500 Iron deficiency anemia secondary to blood loss (chronic): Secondary | ICD-10-CM | POA: Diagnosis not present

## 2016-10-17 DIAGNOSIS — C189 Malignant neoplasm of colon, unspecified: Secondary | ICD-10-CM | POA: Diagnosis not present

## 2016-10-17 DIAGNOSIS — R4182 Altered mental status, unspecified: Secondary | ICD-10-CM | POA: Diagnosis not present

## 2016-10-17 LAB — BASIC METABOLIC PANEL
ANION GAP: 9 (ref 5–15)
BUN: 23 mg/dL — ABNORMAL HIGH (ref 6–20)
CALCIUM: 8.4 mg/dL — AB (ref 8.9–10.3)
CO2: 26 mmol/L (ref 22–32)
Chloride: 104 mmol/L (ref 101–111)
Creatinine, Ser: 1.03 mg/dL — ABNORMAL HIGH (ref 0.44–1.00)
GFR calc Af Amer: >60 mL/min (ref >60)
GFR calc non Af Amer: 55 mL/min — ABNORMAL LOW (ref >60)
GLUCOSE: 72 mg/dL (ref 65–99)
Potassium: 3.7 mmol/L (ref 3.5–5.1)
Sodium: 139 mmol/L (ref 135–145)

## 2016-10-17 LAB — CBC WITH DIFFERENTIAL/PLATELET
BASOS ABS: 0 10*3/uL (ref 0.0–0.1)
Basophils Relative: 0 %
Eosinophils Absolute: 0 10*3/uL (ref 0.0–0.7)
Eosinophils Relative: 0 %
HEMATOCRIT: 30.9 % — AB (ref 36.0–46.0)
Hemoglobin: 10.1 g/dL — ABNORMAL LOW (ref 12.0–15.0)
LYMPHS ABS: 1.1 10*3/uL (ref 0.7–4.0)
LYMPHS PCT: 17 %
MCH: 27.2 pg (ref 26.0–34.0)
MCHC: 32.7 g/dL (ref 30.0–36.0)
MCV: 83.3 fL (ref 78.0–100.0)
MONO ABS: 0.7 10*3/uL (ref 0.1–1.0)
MONOS PCT: 10 %
NEUTROS ABS: 4.6 10*3/uL (ref 1.7–7.7)
Neutrophils Relative %: 73 %
Platelets: 304 10*3/uL (ref 150–400)
RBC: 3.71 MIL/uL — ABNORMAL LOW (ref 3.87–5.11)
RDW: 19.1 % — AB (ref 11.5–15.5)
WBC: 6.4 10*3/uL (ref 4.0–10.5)

## 2016-10-17 LAB — URINE CULTURE: Culture: NO GROWTH

## 2016-10-17 MED ORDER — SENNOSIDES-DOCUSATE SODIUM 8.6-50 MG PO TABS: 1.0000 | ORAL_TABLET | Freq: Two times a day (BID) | ORAL | Status: DC

## 2016-10-17 MED ORDER — ENSURE COMPLETE PO LIQD: 237.0000 mL | Freq: Two times a day (BID) | ORAL | Status: AC

## 2016-10-17 MED ORDER — HEPARIN SOD (PORK) LOCK FLUSH 100 UNIT/ML IV SOLN
500.0000 [IU] | Freq: Once | INTRAVENOUS | Status: AC
  Administered 2016-10-17: 500 [IU] via INTRAVENOUS
  Filled 2016-10-17: qty 5

## 2016-10-17 NOTE — Evaluation (Signed)
Physical Therapy Evaluation Patient Details Name: Lisa Guerra MRN: [id number] DOB: Jul 24, 1946 Today's Date: 10/17/2016   History of Present Illness  Pt admitted through ED with AMS.  Pt with hx of colon CA with liver mets currently undergoing chemo.    Clinical Impression  Pt admitted as above and presenting with functional mobility limitations 2* generalized weakness and mild ambulatory instability.  Pt mobilizing at Sup level with mild instability but no LOB observed including walking fwd, bkwd, sideways, stork standing and tandem walking.  Pt reports intermittent knee pain causing difficulty ambulating in recent past and would benefit from use of RW at home for future.    Follow Up Recommendations No PT follow up    Equipment Recommendations  Rolling walker with 5" wheels    Recommendations for Other Services       Precautions / Restrictions Precautions Precautions: Fall Restrictions Weight Bearing Restrictions: No      Mobility  Bed Mobility Overal bed mobility: Modified Independent             General bed mobility comments: Pt to EOB unassisted  Transfers Overall transfer level: Modified independent Equipment used: None             General transfer comment: min cues for safety in transition  Ambulation/Gait Ambulation/Gait assistance: Min guard;Supervision Ambulation Distance (Feet): 200 Feet Assistive device: None Gait Pattern/deviations: Step-through pattern;Decreased step length - right;Decreased step length - left;Shuffle;Wide base of support Gait velocity: decr Gait velocity interpretation: Below normal speed for age/gender General Gait Details: Mild instability but no LOB  Stairs            Wheelchair Mobility    Modified Rankin (Stroke Patients Only)       Balance Overall balance assessment: Independent                                           Pertinent Vitals/Pain Pain Assessment: No/denies pain     Home Living Family/patient expects to be discharged to:: Private residence Living Arrangements: Children;Spouse/significant other Available Help at Discharge: Family Type of Home: House Home Access: Level entry     Home Layout: Able to live on main level with bedroom/bathroom Home Equipment: None Additional Comments: Pt is staying with children to allow 24/7 assist    Prior Function Level of Independence: Needs assistance   Gait / Transfers Assistance Needed: IND "now that my knees are feeling better"  ADL's / Homemaking Assistance Needed: assist of family        Hand Dominance        Extremity/Trunk Assessment   Upper Extremity Assessment Upper Extremity Assessment: Generalized weakness    Lower Extremity Assessment Lower Extremity Assessment: Generalized weakness       Communication   Communication: No difficulties  Cognition Arousal/Alertness: Awake/alert Behavior During Therapy: WFL for tasks assessed/performed Overall Cognitive Status: Within Functional Limits for tasks assessed                                        General Comments      Exercises     Assessment/Plan    PT Assessment Patient needs continued PT services  PT Problem List Decreased strength;Decreased activity tolerance;Decreased knowledge of use of DME       PT Treatment Interventions DME  instruction;Gait training;Functional mobility training;Therapeutic activities;Balance training;Patient/family education    PT Goals (Current goals can be found in the Care Plan section)  Acute Rehab PT Goals Patient Stated Goal: Regain strength and return home PT Goal Formulation: With patient Time For Goal Achievement: 10/24/16 Potential to Achieve Goals: Good    Frequency Min 3X/week   Barriers to discharge        Co-evaluation               End of Session Equipment Utilized During Treatment: Gait belt Activity Tolerance: Patient tolerated treatment  well;Patient limited by fatigue Patient left: in chair;with call bell/phone within reach;with chair alarm set;with family/visitor present Nurse Communication: Mobility status PT Visit Diagnosis: Unsteadiness on feet (R26.81)    Time: 1030-1050 PT Time Calculation (min) (ACUTE ONLY): 20 min   Charges:   PT Evaluation $PT Eval Low Complexity: 1 Procedure     PT G Codes:   PT G-Codes **NOT FOR INPATIENT CLASS** Functional Assessment Tool Used: Clinical judgement Functional Limitation: Mobility: Walking and moving around Mobility: Walking and Moving Around Current Status (B3435): At least 1 percent but less than 20 percent impaired, limited or restricted Mobility: Walking and Moving Around Goal Status [phone]): At least 1 percent but less than 20 percent impaired, limited or restricted    [phone]  Lisa Guerra 10/17/2016, 12:55 PM

## 2016-10-17 NOTE — Progress Notes (Signed)
Patient discharged to home with family, discharge instructions reviewed with aptient who verbalized understanding. No new RX's.

## 2016-10-17 NOTE — Discharge Summary (Signed)
Physician Discharge Summary  Lisa Guerra WUJ:[id number] DOB: 07-18-1946 DOA: 10/15/2016  PCP: [name], MD  Admit date: 10/15/2016 Discharge date: 10/17/2016  Time spent: 65 minutes  Recommendations for Outpatient Follow-up:  1. Follow-up with Dr. Benay Spice as scheduled on 10/27/2016. On follow-up patient will need CBC done to follow-up on H&H as well as basic metabolic profile done to follow-up on electrolytes and renal function.   Discharge Diagnoses:  Principal Problem:   Acute encephalopathy Active Problems:   Colon cancer (HCC)   Anemia   Elevated LFTs   Protein-calorie malnutrition, severe   Iron deficiency anemia due to chronic blood loss   UTI (urinary tract infection): Probable   Discharge Condition: Stable and improved  Diet recommendation: Regular  Filed Weights   10/16/16 0129  Weight: 44 kg (97 lb)    History of present illness:  Per Dr [place] Nickel Lisa Guerra is a 71 y.o. female with medical history significant of colon cancer s/p cycle 1 FOLFOX on 09/29/16 and cycle 2 on 10/13/16; anemia of chronic disease; HTN; hyperbilirubinemia 2 to metastatic colon cancer involving the liver; and severe malnutrition presented with AMS.  Patient reports "I was hurting around my heart."  Slight confusion.  No current pain.    She was seen in the ER earlier in the day on 3/22 for abdominal pain and received Fentanyl by EMS which resolved the pain.  She was discharged to the cancer center and then sent home.  She started getting confused about 6:30pm 3/22 and was found on the floor, although her son does not think she fell.    At the time of admitting physician's evaluation, the patient was accompanied only by her husband and he was not a reliable historian either.   ED Course:  Head CT reassuring and so was blood work.  Concern for mild BUN elevation - ?dehydration as cause.  Steroid-related or hepatic encephalopathy were also considerations.  Hospital Course:   #1 acute encephalopathy Questionable etiology likely multifactorial secondary to dehydration, possible UTI, possible steroid delirium, and possibly hyper ammonemia as ammonia levels were minimally elevated at 41, possibly symptomatic anemia. Patient was admitted and hydrated with IV fluids placed empirically on IV Rocephin which she received 2 doses of and urine cultures obtained. Urine cultures were pending at time of discharge. Patient was placed on lactulose twice daily during the hospitalization. Patient was also transfused 2 units packed red blood cells secondary to anemia. Patient improved clinically palmar discharged home in stable and improved condition.  #2 symptomatic anemia During the hospitalization patient was noted to be anemic with a hemoglobin down to 6.8 from 9.6( 09/25/2016). Patient noted to have a history of iron deficiency anemia. Patient was transfused 2 units packed red blood cells with improvement with hemoglobin to 10.1 by day of discharge. Patient be discharged home in stable and improved condition and will follow-up with hematology/oncology as scheduled on 10/27/2016.  #3 dehydration Patient was hydrated with IV fluids and was euvolemic by [place] discharge.  #4 severe malnutrition Patient noted to have albumin of 2.1. Patient noted to have poor oral intake during the hospitalization. Patient will be placed on nutritional supplementation on discharge.  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 Outpatient follow-up.  Procedures:  2 units packed red blood cell transfusion 10/16/2016  Chest x-ray 10/15/2016    Consultations:  Oncology :Dr Benay Spice 10/16/2016  Discharge Exam: Vitals:   10/16/16 2039 10/17/16 0422  BP:  129/89 (!) 152/79  Pulse: 74 68  Resp: 18 16  Temp: 97.9 F (36.6 C) 98 F (36.7 C)    General: NAD. Cachetic. Frail. Cardiovascular: RRR Respiratory:  CTAB  Discharge Instructions   Discharge Instructions    Diet general    Complete by:  As directed    Increase activity slowly    Complete by:  As directed      Current Discharge Medication List    START taking these medications   Details  feeding supplement, ENSURE COMPLETE, (ENSURE COMPLETE) LIQD Take 237 mLs by mouth 2 (two) times daily between meals.    senna-docusate (SENOKOT-S) 8.6-50 MG tablet Take 1 tablet by mouth 2 (two) times daily.      CONTINUE these medications which have NOT CHANGED   Details  amLODipine (NORVASC) 5 MG tablet Take 5 mg by mouth daily.    ferrous sulfate 325 (65 FE) MG tablet Take 1 tablet (325 mg total) by mouth 3 (three) times daily with meals.    lidocaine-prilocaine (EMLA) cream Apply 1 application topically as needed. Apply to portacath 1-2 hours prior to use Qty: 30 g, Refills: 2   Associated Diagnoses: Malignant neoplasm of colon, unspecified part of colon ([place]); Metastatic cancer (HCC)    oxyCODONE (OXY IR/ROXICODONE) 5 MG immediate release tablet Take 1 tablet (5 mg total) by mouth every 4 (four) hours as needed for moderate pain. Qty: 30 tablet, Refills: 0    prochlorperazine (COMPAZINE) 5 MG tablet Take 1 tablet (5 mg total) by mouth every 6 (six) hours as needed for nausea or vomiting. Qty: 30 tablet, Refills: 2   Associated Diagnoses: Malignant neoplasm of colon, unspecified part of colon ([place]); Metastatic cancer ([place])      STOP taking these medications     methylPREDNISolone (MEDROL DOSEPAK) 4 MG TBPK tablet        No Known Allergies Follow-up Information    Betsy Coder, MD Follow up on 10/27/2016.   Specialty:  Oncology Why:  F/U AS SCHEDULED Contact information: [place] [address] [phone]        [facility] Follow up.   Why:  Home Health Physical Therapy and Occupational Therapy Contact information: [address] [place]  [address] [phone]            The results of significant diagnostics from this hospitalization (including imaging, microbiology, ancillary and laboratory) are listed below for reference.    Significant Diagnostic Studies: Dg Chest 2 View  Result Date: 10/15/2016 CLINICAL DATA:  Confusion EXAM: CHEST  2 VIEW COMPARISON:  09/24/2016 FINDINGS: Right-sided central venous port tip overlies the SVC. Low lung volumes with mild bibasilar atelectasis. There are tiny pleural effusions. Stable cardiomediastinal silhouette. No pneumothorax. The previously noted small pulmonary nodules are difficult to appreciate on the current study. IMPRESSION: Low lung volumes with tiny pleural effusions and right greater than left bibasilar atelectasis. Electronically Signed   By: Donavan Foil M.D.   On: 10/15/2016 21:39   Dg Chest 2 View  Result Date: 09/24/2016 CLINICAL DATA:  New onset of right-sided chest pain last night. Two days of cough. EXAM: CHEST  2 VIEW COMPARISON:  CT scan of the chest of September 20, 2016 and chest x-ray of the same day. FINDINGS: The lungs are well-expanded. There is no pneumothorax or pneumomediastinum. Multiple subcentimeter nodules are again demonstrated bilaterally. The heart and pulmonary vascularity are normal. The mediastinum is normal in width. There is calcification in the  wall of the aortic arch. 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 IMPRESSION: There is no pneumothorax, pleural effusion, nor pneumonia. Multiple subcentimeter pulmonary nodules remain. No CHF. Thoracic aortic atherosclerosis. Electronically Signed   By: David  Martinique M.D.   On: 09/24/2016 07:19   Dg Chest 2 View  Result Date: 09/20/2016 CLINICAL DATA:  Shortness of breath, chest pressure, nonproductive cough. EXAM: CHEST  2 VIEW COMPARISON:  CT 10 days prior 09/10/2016 FINDINGS: The cardiomediastinal contours are normal. Calcified granuloma at the right lung base. The pulmonary nodules on  recent chest CT are not well delineated radiographically, 1 of the left lung nodules potentially identified. Pulmonary vasculature is normal. No consolidation, pleural effusion, or pneumothorax. No acute osseous abnormalities are seen. IMPRESSION: No acute abnormality visualized radiographically. Majority of the pulmonary nodules on recent chest CT are not visualized by radiograph. Electronically Signed   By: Jeb Levering M.D.   On: 09/20/2016 20:33   Ct Head Wo Contrast  Result Date: 10/15/2016 CLINICAL DATA:  Confusion EXAM: CT HEAD WITHOUT CONTRAST TECHNIQUE: Contiguous axial images were obtained from the base of the skull through the vertex without intravenous contrast. COMPARISON:  None. FINDINGS: Brain: No acute territorial infarction, intracranial hemorrhage or focal mass lesion is visualized. Mild periventricular white matter small vessel change. Mild atrophy. No midline shift. Vascular: No hyperdense vessels.  Carotid artery calcifications. Skull: Normal. Negative for fracture or focal lesion. Sinuses/Orbits: No acute finding. Other: None IMPRESSION: No CT evidence for acute intracranial abnormality. Electronically Signed   By: Donavan Foil M.D.   On: 10/15/2016 22:09   Ct Angio Chest Pe W And/or Wo Contrast  Result Date: 09/20/2016 CLINICAL DATA:  Chest pain/pressure, onset today. Recent diagnosis of colon cancer. EXAM: CT ANGIOGRAPHY CHEST WITH CONTRAST TECHNIQUE: Multidetector CT imaging of the chest was performed using the standard protocol during bolus administration of intravenous contrast. Multiplanar CT image reconstructions and MIPs were obtained to evaluate the vascular anatomy. CONTRAST:  Seventy-five cc Isovue 100 IV COMPARISON:  Radiographs earlier this day. Chest CT 10 days prior 09/10/2016 FINDINGS: Cardiovascular: There are no filling defects within the pulmonary arteries to suggest pulmonary embolus. No aortic dissection or aneurysm. Mild aortic atherosclerosis. Heart is normal  in size. Scattered coronary artery calcifications. Mediastinum/Nodes: No pericardial effusion. No mediastinal or hilar adenopathy. Visualized thyroid gland is unremarkable. The esophagus is decompressed. Lungs/Pleura: No evidence of pulmonary edema or focal airspace disease. No pleural fluid. Multiple subcentimeter pulmonary nodules are scattered throughout both lungs, unchanged from recent prior exam. Upper Abdomen: Hepatic metastatic disease again seen. Porta hepatis adenopathy again seen. Upper abdomen better assessed on prior exam due to phase of contrast. Musculoskeletal: No blastic or evidence of lytic osseous lesions. No acute osseous abnormality. Review of the MIP images confirms the above findings. IMPRESSION: 1. No pulmonary embolus. 2. No acute abnormality. Multiple subcentimeter pulmonary nodules consistent with metastatic disease are unchanged from recent prior exam. 3. Mild aortic atherosclerosis.  Coronary artery calcifications. Electronically Signed   By: Jeb Levering M.D.   On: 09/20/2016 22:14   Ct Abdomen W Contrast  Result Date: 09/29/2016 CLINICAL DATA:  Right upper quadrant abdominal pain. Known metastatic colon cancer. EXAM: CT ABDOMEN WITH CONTRAST TECHNIQUE: Multidetector CT imaging of the abdomen was performed using the standard protocol following bolus administration of intravenous contrast. CONTRAST:  54mL ISOVUE-300 IOPAMIDOL (ISOVUE-300) INJECTION 61% COMPARISON:  MRI 09/25/2016 FINDINGS: Lower chest: Pulmonary metastatic nodules are again demonstrated along with a right lower lobe calcified granuloma.  The heart is normal in size. No pericardial effusion. There is a small right pleural effusion with overlying atelectasis. A right-sided Port-A-Cath is noted. Hepatobiliary: Diffuse hepatic metastatic disease as demonstrated on the recent MRI examination. Large confluent mass occupying the right hepatic lobe and measuring 10.5 x 9.0 cm. Mild stable intrahepatic biliary dilatation  likely due to bulky portal adenopathy. No common bile duct dilatation. The gallbladder is filled with high attenuation material which could be tumor, sludge or hemorrhage. Pancreas: No mass, inflammation or ductal dilatation. Mild mass effect on the pancreatic body by a bulky celiac axis adenopathy. Spleen: Normal size.  No focal lesions. Adrenals/Urinary Tract: Stable large right adrenal gland metastasis. The kidneys are grossly normal in stable. Stomach/Bowel: The stomach, duodenum and visualized small bowel are unremarkable. Stable large hepatic flexure colon mass measuring 5.7 x 4.2 cm. Vascular/Lymphatic: The aorta and branch vessels are patent. The major venous structures are patent. Significant compression of the middle and right portal veins due to the large metastatic lesions. Extensive bulky gastrohepatic ligament, portal, celiac axis, mesenteric and retroperitoneal adenopathy. Other: Small amount of ascites. Musculoskeletal: No significant bony findings. IMPRESSION: 1. Large hepatic flexure colon mass with diffuse hepatic metastasis, bulky abdominal lymphadenopathy, right adrenal gland metastasis and lung metastasis. 2. Mild gallbladder distention. The gallbladder is filled high attenuation material which could be sludge, hemorrhage or tumor. 3. Stable mild intrahepatic biliary dilatation likely due to bulky portal adenopathy. Electronically Signed   By: Marijo Sanes M.D.   On: 09/29/2016 12:32   Mr 3d Recon At Scanner  Result Date: 09/25/2016 CLINICAL DATA:  Elevated liver function tests. Metastatic colon cancer after colonoscopy 09/07/2016. Cecal mass. EXAM: MRI ABDOMEN WITHOUT CONTRAST  (INCLUDING MRCP) TECHNIQUE: Multiplanar multisequence MR imaging of the abdomen was performed. Heavily T2-weighted images of the biliary and pancreatic ducts were obtained, and three-dimensional MRCP images were rendered by post processing. COMPARISON:  09/10/2016 abdominopelvic CT. FINDINGS: Lower chest: pulmonary  metastasis, suboptimally evaluated. Normal heart size without pericardial or pleural effusion. Hepatobiliary: Extensive large volume hepatic metastatic burden. Index dominant right hepatic lobe lesion measures 12.5 x 8.3 cm on image 34/series 11002. Compare 11.6 x 9.2 cm on the prior. Index posterior left hepatic lobe lesion measures 3.9 x 2.3 cm on image 39/ series 11002. Compare 3.1 x 2.2 cm on the prior exam (when remeasured). Gallstones. Mild gallbladder distention without specific evidence of acute cholecystitis. Intrahepatic duct dilatation is mild in the right hepatic lobe. Example image 28/series 11002 and image 24/series 9. This is either new or increased . Also more conspicuous secondary to better soft tissue contrast of MRI. The common duct is normal in caliber, without choledocholithiasis or obstructive process. Pancreas:  Normal, without mass or ductal dilatation. Spleen:  Normal in size, without focal abnormality. Adrenals/Urinary Tract: Normal left adrenal gland. Right adrenal metastasis measures 2.5 cm on image 53/ series 11002. Compare 2.1 cm on the prior exam (when remeasured). Mild renal cortical thinning bilaterally. No hydronephrosis. Stomach/Bowel: Normal caliber the stomach. No small bowel obstruction. Hepatic flexure colonic mass with direct pericolonic extension, better evaluated on prior CT. Vascular/Lymphatic: Normal caliber of the aorta and branch vessels. Extensive mass-effect upon the portal vein, including image 54/ series 11002. No portal vein thrombus. Extensive abdominal adenopathy. Example mass in the porta hepatis measures 5.9 x 3.3 cm on image 50/ series 11002. Compare 5.4 x 3.3 cm on the prior exam. A left retroperitoneal node measures 2.0 x 1.6 cm on image 55/series 11002. Compare 1.8 x 1.6  cm on the prior exam (when remeasured). Other:  Small volume ascites is increased. Musculoskeletal: S-shaped thoracolumbar spine curvature. IMPRESSION: 1. Increase in large volume hepatic  metastatic burden. 2. New or increased mild right intrahepatic biliary duct dilatation, likely due to mass effect from the dominant right-sided mass. No common duct dilatation. 3. Abdominal adenopathy, mildly progressive. 4. Cholelithiasis. 5. Increase in small volume ascites. 6. Pulmonary metastasis. 7. Progressive right adrenal metastasis. Electronically Signed   By: Abigail Miyamoto M.D.   On: 09/25/2016 20:06   Ir US Guide Vasc Access Right  Result Date: 09/25/2016 CLINICAL DATA:  Colon carcinoma. Liver lesions. Needs durable venous access for chemotherapy regimen. EXAM: TUNNELED PORT CATHETER PLACEMENT WITH ULTRASOUND AND FLUOROSCOPIC GUIDANCE FLUOROSCOPY TIME:  0.2 minutes (829 uGym2 DAP) ANESTHESIA/SEDATION: Intravenous Fentanyl and Versed were administered as conscious sedation during continuous monitoring of the patient's level of consciousness and physiological / cardiorespiratory status by the radiology RN, with a total moderate sedation time of 22 minutes. TECHNIQUE: The procedure, risks, benefits, and alternatives were explained to the patient. Questions regarding the procedure were encouraged and answered. The patient understands and consents to the procedure. As antibiotic prophylaxis, cefazolin 2 g was ordered pre-procedure and administered intravenously within one hour of incision. Patency of the right IJ vein was confirmed with ultrasound with image documentation. An appropriate skin site was determined. Skin site was marked. Region was prepped using maximum barrier technique including cap and mask, sterile gown, sterile gloves, large sterile sheet, and Chlorhexidine as cutaneous antisepsis. The region was infiltrated locally with 1% lidocaine. Under real-time ultrasound guidance, the right IJ vein was accessed with a 21 gauge micropuncture needle; the needle tip within the vein was confirmed with ultrasound image documentation. Needle was exchanged over a 018 guidewire for transitional dilator  which allowed passage of the [facility] wire into the IVC. Over this, the transitional dilator was exchanged for a 5 [place] MPA catheter. A small incision was made on the right anterior chest wall and a subcutaneous pocket fashioned. The power-injectable port was positioned and its catheter tunneled to the right IJ dermatotomy site. The MPA catheter was exchanged over an Amplatz wire for a peel-away sheath, through which the port catheter, which had been trimmed to the appropriate length, was advanced and positioned under fluoroscopy with its tip at the cavoatrial junction. Spot chest radiograph confirms good catheter position and no pneumothorax. The pocket was closed with deep interrupted and subcuticular continuous 3-0 Monocryl sutures. The port was flushed per protocol. The incisions were covered with Dermabond then covered with a sterile dressing. COMPLICATIONS: COMPLICATIONS None immediate IMPRESSION: Technically successful right IJ power-injectable port catheter placement. Ready for routine use. Electronically Signed   By: Lucrezia Europe M.D.   On: 09/25/2016 08:22   Ir US Guide Bx Asp/drain  Result Date: 09/25/2016 CLINICAL DATA:  Colon carcinoma.  Multiple liver lesions. EXAM: ULTRASOUND GUIDED CORE BIOPSY OF LIVER LESION MEDICATIONS: Intravenous Fentanyl and Versed were administered as conscious sedation during continuous monitoring of the patient's level of consciousness and physiological / cardiorespiratory status by the radiology RN, with a total moderate sedation time of 7 minutes. PROCEDURE: The procedure, risks, benefits, and alternatives were explained to the patient. Questions regarding the procedure were encouraged and answered. The patient understands and consents to the procedure. Survey ultrasound of the liver performed. An appropriate representative right lobe lesion was localized and skin entry site determined and marked. The operative field was prepped with chlorhexidine in a sterile fashion, and  a  sterile drape was applied covering the operative field. A sterile gown and sterile gloves were used for the procedure. Local anesthesia was provided with 1% Lidocaine. Under real-time ultrasound guidance, a 17 gauge trocar needle was advanced to the margin of the lesion. Once needle tip position was confirmed, coaxial 18-gauge core biopsy samples were obtained, submitted in formalin to surgical pathology. The guide needle was removed. Postprocedure scans show no hemorrhage or other apparent complication. The patient tolerated the procedure well. COMPLICATIONS: None. FINDINGS: Multiple echogenic liver lesions were localized corresponding to findings on recent CT. Representative core biopsy samples were obtained as above. IMPRESSION: 1. Technically successful ultrasound-guided core biopsy, liver lesion. Electronically Signed   By: Lucrezia Europe M.D.   On: 09/25/2016 08:24   Mr Abdomen With Mrcp W Contrast  Result Date: 09/25/2016 CLINICAL DATA:  Elevated liver function tests. Metastatic colon cancer after colonoscopy 09/07/2016. Cecal mass. EXAM: MRI ABDOMEN WITHOUT CONTRAST  (INCLUDING MRCP) TECHNIQUE: Multiplanar multisequence MR imaging of the abdomen was performed. Heavily T2-weighted images of the biliary and pancreatic ducts were obtained, and three-dimensional MRCP images were rendered by post processing. COMPARISON:  09/10/2016 abdominopelvic CT. FINDINGS: Lower chest: pulmonary metastasis, suboptimally evaluated. Normal heart size without pericardial or pleural effusion. Hepatobiliary: Extensive large volume hepatic metastatic burden. Index dominant right hepatic lobe lesion measures 12.5 x 8.3 cm on image 34/series 11002. Compare 11.6 x 9.2 cm on the prior. Index posterior left hepatic lobe lesion measures 3.9 x 2.3 cm on image 39/ series 11002. Compare 3.1 x 2.2 cm on the prior exam (when remeasured). Gallstones. Mild gallbladder distention without specific evidence of acute cholecystitis. Intrahepatic  duct dilatation is mild in the right hepatic lobe. Example image 28/series 11002 and image 24/series 9. This is either new or increased . Also more conspicuous secondary to better soft tissue contrast of MRI. The common duct is normal in caliber, without choledocholithiasis or obstructive process. Pancreas:  Normal, without mass or ductal dilatation. Spleen:  Normal in size, without focal abnormality. Adrenals/Urinary Tract: Normal left adrenal gland. Right adrenal metastasis measures 2.5 cm on image 53/ series 11002. Compare 2.1 cm on the prior exam (when remeasured). Mild renal cortical thinning bilaterally. No hydronephrosis. Stomach/Bowel: Normal caliber the stomach. No small bowel obstruction. Hepatic flexure colonic mass with direct pericolonic extension, better evaluated on prior CT. Vascular/Lymphatic: Normal caliber of the aorta and branch vessels. Extensive mass-effect upon the portal vein, including image 54/ series 11002. No portal vein thrombus. Extensive abdominal adenopathy. Example mass in the porta hepatis measures 5.9 x 3.3 cm on image 50/ series 11002. Compare 5.4 x 3.3 cm on the prior exam. A left retroperitoneal node measures 2.0 x 1.6 cm on image 55/series 11002. Compare 1.8 x 1.6 cm on the prior exam (when remeasured). Other:  Small volume ascites is increased. Musculoskeletal: S-shaped thoracolumbar spine curvature. IMPRESSION: 1. Increase in large volume hepatic metastatic burden. 2. New or increased mild right intrahepatic biliary duct dilatation, likely due to mass effect from the dominant right-sided mass. No common duct dilatation. 3. Abdominal adenopathy, mildly progressive. 4. Cholelithiasis. 5. Increase in small volume ascites. 6. Pulmonary metastasis. 7. Progressive right adrenal metastasis. Electronically Signed   By: Abigail Miyamoto M.D.   On: 09/25/2016 20:06   Ir Fluoro Guide Port Insertion Right  Result Date: 09/25/2016 CLINICAL DATA:  Colon carcinoma. Liver lesions. Needs  durable venous access for chemotherapy regimen. EXAM: TUNNELED PORT CATHETER PLACEMENT WITH ULTRASOUND AND FLUOROSCOPIC GUIDANCE FLUOROSCOPY TIME:  0.2 minutes (829 uGym2  DAP) ANESTHESIA/SEDATION: Intravenous Fentanyl and Versed were administered as conscious sedation during continuous monitoring of the patient's level of consciousness and physiological / cardiorespiratory status by the radiology RN, with a total moderate sedation time of 22 minutes. TECHNIQUE: The procedure, risks, benefits, and alternatives were explained to the patient. Questions regarding the procedure were encouraged and answered. The patient understands and consents to the procedure. As antibiotic prophylaxis, cefazolin 2 g was ordered pre-procedure and administered intravenously within one hour of incision. Patency of the right IJ vein was confirmed with ultrasound with image documentation. An appropriate skin site was determined. Skin site was marked. Region was prepped using maximum barrier technique including cap and mask, sterile gown, sterile gloves, large sterile sheet, and Chlorhexidine as cutaneous antisepsis. The region was infiltrated locally with 1% lidocaine. Under real-time ultrasound guidance, the right IJ vein was accessed with a 21 gauge micropuncture needle; the needle tip within the vein was confirmed with ultrasound image documentation. Needle was exchanged over a 018 guidewire for transitional dilator which allowed passage of the [facility] wire into the IVC. Over this, the transitional dilator was exchanged for a 5 [place] MPA catheter. A small incision was made on the right anterior chest wall and a subcutaneous pocket fashioned. The power-injectable port was positioned and its catheter tunneled to the right IJ dermatotomy site. The MPA catheter was exchanged over an Amplatz wire for a peel-away sheath, through which the port catheter, which had been trimmed to the appropriate length, was advanced and positioned under  fluoroscopy with its tip at the cavoatrial junction. Spot chest radiograph confirms good catheter position and no pneumothorax. The pocket was closed with deep interrupted and subcuticular continuous 3-0 Monocryl sutures. The port was flushed per protocol. The incisions were covered with Dermabond then covered with a sterile dressing. COMPLICATIONS: COMPLICATIONS None immediate IMPRESSION: Technically successful right IJ power-injectable port catheter placement. Ready for routine use. Electronically Signed   By: Lucrezia Europe M.D.   On: 09/25/2016 08:22   US Abdomen Limited Ruq  Result Date: 09/21/2016 CLINICAL DATA:  Elevated liver enzymes.  History of colon cancer . EXAM: US ABDOMEN LIMITED - RIGHT UPPER QUADRANT COMPARISON:  CT 09/10/2016 . FINDINGS: Gallbladder: Gallbladder is contracted. Gallbladder wall is thickened at 7.3 mm. This may be from contracted state however cholecystitis cannot be excluded. Hyperproteinemia can also present this fashion . No gallstones noted . Common bile duct: Diameter: 4.0 mm Liver: Liver has a heterogeneous echotexture with multiple solid liver lesions again noted. The largest measures 5.0 x 4.3 x 6.5 cm and is in the right hepatic lobe. A 3.8 cm mass is noted within or adjacent to the left hepatic lobe as well. These findings are consistent with metastatic disease. IMPRESSION: 1. No gallstones. Gallbladder wall is thickened. This may be from partially contracted state. This could also be from hypoproteinemia cholecystitis. No biliary distention noted. 2. Multiple hepatic lesions consistent with metastatic disease . A a mass lesion in the periphery of the left hepatic lobe or adjacent to the left hepatic lobe is noted. This also most likely a exophytic metastatic lesion in the liver versus adjacent adenopathy. Electronically Signed   By: Marcello Moores  Register   On: 09/21/2016 14:06    Microbiology: Recent Results (from the past 240 hour(s))  Culture, Urine     Status: Abnormal  (Preliminary result)   Collection Time: 10/15/16 11:08 PM  Result Value Ref Range Status   Specimen Description URINE, RANDOM  Final   Special Requests NONE  Final   Culture >=100,000 COLONIES/mL GRAM NEGATIVE RODS (A)  Final   Report Status PENDING  Incomplete     Labs: Basic Metabolic Panel:  Recent Labs Lab 10/13/16 0842 10/15/16 2102 10/16/16 0600 10/17/16 0430  NA 140 138 140 139  K 4.2 4.3 4.1 3.7  CL  --  108 109 104  CO2 22 23 24 26   GLUCOSE 89 105* 85 72  BUN 18.2 27* 26* 23*  CREATININE 0.9 0.96 0.91 1.03*  CALCIUM 8.7 8.4* 8.3* 8.4*   Liver Function Tests:  Recent Labs Lab 10/13/16 0842 10/15/16 2102  AST 38* 44*  ALT 38 36  ALKPHOS 482* 430*  BILITOT 2.99* 3.1*  PROT 6.6 6.1*  ALBUMIN 2.0* 2.1*   No results for input(s): LIPASE, AMYLASE in the last 168 hours.  Recent Labs Lab 10/16/16 0245  AMMONIA 41*   CBC:  Recent Labs Lab 10/13/16 0842 10/15/16 2102 10/16/16 0600 10/16/16 2300 10/17/16 0430  WBC 16.7* 9.9 9.3 6.7 6.4  NEUTROABS 14.6* 9.0*  --   --  4.6  HGB 7.9* 7.1* 6.8* 9.9* 10.1*  HCT 24.3* 22.3* 21.2* 30.1* 30.9*  MCV 83.1 84.2 84.5 83.4 83.3  PLT 508* 391 336 306 304   Cardiac Enzymes:  Recent Labs Lab 10/15/16 2142  TROPONINI <0.03   BNP: BNP (last 3 results) No results for input(s): BNP in the last 8760 hours.  ProBNP (last 3 results) No results for input(s): PROBNP in the last 8760 hours.  CBG: No results for input(s): GLUCAP in the last 168 hours.     SignedIrine Seal MD.  [facility] 10/17/2016, 10:53 AM

## 2016-10-17 NOTE — Care Management Note (Signed)
Case Management Note  Patient Details  Name: Lisa Guerra MRN: [id number] Date of Birth: [birth year]  Subjective/Objective:     Metastatic colon cancer               Action/Plan: Discharge Planning: AVS reviewed:  NCM spoke to pt and husband, Lisa Guerra at bedside. Pt will staying with son, Lisa Guerra for a period of time, [address], [place] [address]. Offered choice for HH/list provided. Pt agreeable to [facility] for [facility]. Requested RW for home. Contacted [facility] DME rep for RW for home. Updated [facility] Liaison of address.   PCP [name] MD   Expected Discharge Date:  10/17/16               Expected Discharge Plan:  [place]  In-House Referral:  NA  Discharge planning Services  CM Consult  Post Acute Care Choice:  Home Health Choice offered to:  Patient, Spouse  DME Arranged:  [place] [facility] D[facility] Agency:  [place] Arranged:  PT, OT [facility] Agency:  [place]  Status of Service:  Completed, signed off  If discussed at [place] of Stay Meetings, dates discussed:    Additional Comments:  Erenest Rasher, RN 10/17/2016, 11:06 AM

## 2016-10-18 LAB — URINE CULTURE: Culture: >=100000 — AB

## 2016-10-19 LAB — TYPE AND SCREEN
ABO/RH(D): O POS
Antibody Screen: NEGATIVE
Unit division: 0
Unit division: 0

## 2016-10-19 LAB — BPAM RBC
Blood Product Expiration Date: 201804182359
Blood Product Expiration Date: 201804182359
ISSUE DATE / TIME: 201803231211
ISSUE DATE / TIME: 201803231542
Unit Type and Rh: 5100
Unit Type and Rh: 5100

## 2016-10-20 ENCOUNTER — Encounter (HOSPITAL_COMMUNITY): Payer: Self-pay

## 2016-10-20 ENCOUNTER — Telehealth: Payer: Self-pay | Admitting: Oncology

## 2016-10-20 NOTE — Telephone Encounter (Signed)
Faxed FMLA/Disability paperwork to [employer] [phone]

## 2016-10-25 ENCOUNTER — Other Ambulatory Visit: Payer: Self-pay | Admitting: Oncology

## 2016-10-27 ENCOUNTER — Ambulatory Visit (HOSPITAL_BASED_OUTPATIENT_CLINIC_OR_DEPARTMENT_OTHER): Payer: Medicare Other | Admitting: Oncology

## 2016-10-27 ENCOUNTER — Other Ambulatory Visit: Payer: Self-pay | Admitting: *Deleted

## 2016-10-27 ENCOUNTER — Ambulatory Visit (HOSPITAL_BASED_OUTPATIENT_CLINIC_OR_DEPARTMENT_OTHER): Payer: Medicare Other

## 2016-10-27 ENCOUNTER — Ambulatory Visit: Payer: Medicare Other

## 2016-10-27 ENCOUNTER — Other Ambulatory Visit (HOSPITAL_BASED_OUTPATIENT_CLINIC_OR_DEPARTMENT_OTHER): Payer: Medicare Other

## 2016-10-27 VITALS — BP 140/97 | HR 81 | Temp 98.5°F | Resp 18 | Ht 65.0 in | Wt 99.5 lb

## 2016-10-27 DIAGNOSIS — D630 Anemia in neoplastic disease: Secondary | ICD-10-CM

## 2016-10-27 DIAGNOSIS — I10 Essential (primary) hypertension: Secondary | ICD-10-CM

## 2016-10-27 DIAGNOSIS — N189 Chronic kidney disease, unspecified: Secondary | ICD-10-CM | POA: Diagnosis not present

## 2016-10-27 DIAGNOSIS — C189 Malignant neoplasm of colon, unspecified: Secondary | ICD-10-CM

## 2016-10-27 DIAGNOSIS — C180 Malignant neoplasm of cecum: Secondary | ICD-10-CM

## 2016-10-27 DIAGNOSIS — C787 Secondary malignant neoplasm of liver and intrahepatic bile duct: Secondary | ICD-10-CM

## 2016-10-27 DIAGNOSIS — Z5111 Encounter for antineoplastic chemotherapy: Secondary | ICD-10-CM

## 2016-10-27 DIAGNOSIS — C799 Secondary malignant neoplasm of unspecified site: Secondary | ICD-10-CM

## 2016-10-27 LAB — COMPREHENSIVE METABOLIC PANEL
ALT: 58 U/L — ABNORMAL HIGH (ref 0–55)
ANION GAP: 9 meq/L (ref 3–11)
AST: 68 U/L — ABNORMAL HIGH (ref 5–34)
Albumin: 2.2 g/dL — ABNORMAL LOW (ref 3.5–5.0)
Alkaline Phosphatase: 460 U/L — ABNORMAL HIGH (ref 40–150)
BUN: 16.2 mg/dL (ref 7.0–26.0)
CHLORIDE: 106 meq/L (ref 98–109)
CO2: 23 meq/L (ref 22–29)
Calcium: 8.9 mg/dL (ref 8.4–10.4)
Creatinine: 0.8 mg/dL (ref 0.6–1.1)
EGFR: 86 mL/min/{1.73_m2} — ABNORMAL LOW (ref >90)
GLUCOSE: 99 mg/dL (ref 70–140)
POTASSIUM: 3.7 meq/L (ref 3.5–5.1)
SODIUM: 138 meq/L (ref 136–145)
Total Bilirubin: 4.88 mg/dL (ref 0.20–1.20)
Total Protein: 6.1 g/dL — ABNORMAL LOW (ref 6.4–8.3)

## 2016-10-27 LAB — CBC WITH DIFFERENTIAL/PLATELET
BASO%: 0.9 % (ref 0.0–2.0)
BASOS ABS: 0.1 10*3/uL (ref 0.0–0.1)
EOS ABS: 0 10*3/uL (ref 0.0–0.5)
EOS%: 0.4 % (ref 0.0–7.0)
HCT: 31.8 % — ABNORMAL LOW (ref 34.8–46.6)
HGB: 10.5 g/dL — ABNORMAL LOW (ref 11.6–15.9)
LYMPH%: 8.1 % — ABNORMAL LOW (ref 14.0–49.7)
MCH: 27.6 pg (ref 25.1–34.0)
MCHC: 33 g/dL (ref 31.5–36.0)
MCV: 83.7 fL (ref 79.5–101.0)
MONO#: 1 10*3/uL — ABNORMAL HIGH (ref 0.1–0.9)
MONO%: 8 % (ref 0.0–14.0)
NEUT%: 82.6 % — ABNORMAL HIGH (ref 38.4–76.8)
NEUTROS ABS: 10.1 10*3/uL — AB (ref 1.5–6.5)
PLATELETS: 291 10*3/uL (ref 145–400)
RBC: 3.8 10*6/uL (ref 3.70–5.45)
RDW: 19.9 % — ABNORMAL HIGH (ref 11.2–14.5)
WBC: 12.3 10*3/uL — ABNORMAL HIGH (ref 3.9–10.3)
lymph#: 1 10*3/uL (ref 0.9–3.3)

## 2016-10-27 LAB — CEA (IN HOUSE-CHCC): CEA (CHCC-In House): 765.15 ng/mL — ABNORMAL HIGH (ref 0.00–5.00)

## 2016-10-27 MED ORDER — SODIUM CHLORIDE 0.9% FLUSH
10.0000 mL | INTRAVENOUS | Status: DC | PRN
  Filled 2016-10-27: qty 10

## 2016-10-27 MED ORDER — DEXTROSE 5 % IV SOLN
Freq: Once | INTRAVENOUS | Status: AC
  Administered 2016-10-27: 10:00:00 via INTRAVENOUS

## 2016-10-27 MED ORDER — DEXAMETHASONE SODIUM PHOSPHATE 10 MG/ML IJ SOLN
INTRAMUSCULAR | Status: AC
  Filled 2016-10-27: qty 1

## 2016-10-27 MED ORDER — PALONOSETRON HCL INJECTION 0.25 MG/5ML
0.2500 mg | Freq: Once | INTRAVENOUS | Status: AC
Start: 2016-10-27 — End: 2016-10-27
  Administered 2016-10-27: 0.25 mg via INTRAVENOUS

## 2016-10-27 MED ORDER — OXALIPLATIN CHEMO INJECTION 100 MG/20ML
65.0000 mg/m2 | Freq: Once | INTRAVENOUS | Status: AC
  Administered 2016-10-27: 95 mg via INTRAVENOUS
  Filled 2016-10-27: qty 19

## 2016-10-27 MED ORDER — HEPARIN SOD (PORK) LOCK FLUSH 100 UNIT/ML IV SOLN
500.0000 [IU] | Freq: Once | INTRAVENOUS | Status: DC | PRN
  Filled 2016-10-27: qty 5

## 2016-10-27 MED ORDER — FLUOROURACIL CHEMO INJECTION 5 GM/100ML
1600.0000 mg/m2 | INTRAVENOUS | Status: DC
  Administered 2016-10-27: 2350 mg via INTRAVENOUS
  Filled 2016-10-27: qty 47

## 2016-10-27 MED ORDER — OXYCODONE HCL 5 MG PO TABS: 5.0000 mg | ORAL_TABLET | ORAL | 0 refills | Status: AC | PRN

## 2016-10-27 MED ORDER — PALONOSETRON HCL INJECTION 0.25 MG/5ML
INTRAVENOUS | Status: AC
  Filled 2016-10-27: qty 5

## 2016-10-27 MED ORDER — LEUCOVORIN CALCIUM INJECTION 350 MG
300.0000 mg/m2 | Freq: Once | INTRAMUSCULAR | Status: AC
  Administered 2016-10-27: 442 mg via INTRAVENOUS
  Filled 2016-10-27: qty 22.1

## 2016-10-27 MED ORDER — DEXAMETHASONE SODIUM PHOSPHATE 10 MG/ML IJ SOLN
10.0000 mg | Freq: Once | INTRAMUSCULAR | Status: AC
  Administered 2016-10-27: 10 mg via INTRAVENOUS

## 2016-10-27 NOTE — Patient Instructions (Signed)
Implanted Port Home Guide An implanted port is a type of central line that is placed under the skin. Central lines are used to provide IV access when treatment or nutrition needs to be given through a person's veins. Implanted ports are used for long-term IV access. An implanted port may be placed because:  You need IV medicine that would be irritating to the small veins in your hands or arms.  You need long-term IV medicines, such as antibiotics.  You need IV nutrition for a long period.  You need frequent blood draws for lab tests.  You need dialysis.  Implanted ports are usually placed in the chest area, but they can also be placed in the upper arm, the abdomen, or the leg. An implanted port has two main parts:  Reservoir. The reservoir is round and will appear as a small, raised area under your skin. The reservoir is the part where a needle is inserted to give medicines or draw blood.  Catheter. The catheter is a thin, flexible tube that extends from the reservoir. The catheter is placed into a large vein. Medicine that is inserted into the reservoir goes into the catheter and then into the vein.  How will I care for my incision site? Do not get the incision site wet. Bathe or shower as directed by your health care provider. How is my port accessed? Special steps must be taken to access the port:  Before the port is accessed, a numbing cream can be placed on the skin. This helps numb the skin over the port site.  Your health care provider uses a sterile technique to access the port. ? Your health care provider must put on a mask and sterile gloves. ? The skin over your port is cleaned carefully with an antiseptic and allowed to dry. ? The port is gently pinched between sterile gloves, and a needle is inserted into the port.  Only "non-coring" port needles should be used to access the port. Once the port is accessed, a blood return should be checked. This helps ensure that the port  is in the vein and is not clogged.  If your port needs to remain accessed for a constant infusion, a clear (transparent) bandage will be placed over the needle site. The bandage and needle will need to be changed every week, or as directed by your health care provider.  Keep the bandage covering the needle clean and dry. Do not get it wet. Follow your health care provider's instructions on how to take a shower or bath while the port is accessed.  If your port does not need to stay accessed, no bandage is needed over the port.  What is flushing? Flushing helps keep the port from getting clogged. Follow your health care provider's instructions on how and when to flush the port. Ports are usually flushed with saline solution or a medicine called heparin. The need for flushing will depend on how the port is used.  If the port is used for intermittent medicines or blood draws, the port will need to be flushed: ? After medicines have been given. ? After blood has been drawn. ? As part of routine maintenance.  If a constant infusion is running, the port may not need to be flushed.  How long will my port stay implanted? The port can stay in for as long as your health care provider thinks it is needed. When it is time for the port to come out, surgery will be   done to remove it. The procedure is similar to the one performed when the port was put in. When should I seek immediate medical care? When you have an implanted port, you should seek immediate medical care if:  You notice a bad smell coming from the incision site.  You have swelling, redness, or drainage at the incision site.  You have more swelling or pain at the port site or the surrounding area.  You have a fever that is not controlled with medicine.  This information is not intended to replace advice given to you by your health care provider. Make sure you discuss any questions you have with your health care provider. Document  Released: 07/13/2005 Document Revised: 12/19/2015 Document Reviewed: 03/20/2013 Elsevier Interactive Patient Education  2017 Elsevier Inc.  

## 2016-10-27 NOTE — Progress Notes (Addendum)
  [facility] OFFICE PROGRESS NOTE   Diagnosis: Colon cancer  INTERVAL HISTORY:   Ms. Copus returns as scheduled. She completed cycle 2 FOLFOX 10/13/2016. She was seen in the emergency room on 10/15/2016 with abdominal pain and then was admitted with altered mental status on 10/15/2016. Her mental status returned to baseline. She was transfused with packed red blood cells secondary to a hemoglobin of 6.8. She was discharged 10/17/2016. She reports feeling well. Good appetite. She no longer has knee pain. No abdominal pain. No mouth sores, diarrhea, or neuropathy symptoms.    Objective:  Vital signs in last 24 hours:  Blood pressure (!) 140/97, pulse 81, temperature 98.5 F (36.9 C), temperature source Oral, resp. rate 18, height '5\' 5"'$  (1.651 m), weight 99 lb 8 oz (45.1 kg).    HEENT: Scleral icterus, no thrush or ulcers Resp: Lungs clear bilaterally Cardio: Regular rate and rhythm GI: Fullness in the right upper quadrant without a discrete liver edge, nontender Vascular: No leg edema  Musculoskeletal: Small left knee effusion, no right knee effusion   Portacath/PICC-without erythema  Lab Results:  Lab Results  Component Value Date   WBC 12.3 (H) 10/27/2016   HGB 10.5 (L) 10/27/2016   HCT 31.8 (L) 10/27/2016   MCV 83.7 10/27/2016   PLT 291 10/27/2016   NEUTROABS 10.1 (H) 10/27/2016    Medications: I have reviewed the patient's current medications.  Assessment/Plan: 1. Metastatic colon cancer-adenocarcinoma on biopsy of a cecal mass 09/07/2016  Separate polypoid mass at the sigmoid colon, biopsy to 15 2018 revealed a tubular adenoma with at least high-grade dysplasia  Staging CTs of the chest, abdomen, and pelvis 09/08/2016-extensive liver and lung metastases, bulky abdominal/retroperitoneal lymphadenopathy  Daryel Gerald elevated CEA  Ultrasound-guided biopsy of a liver lesion 09/25/2016 confirmed metastatic colon cancer, Foundation 1 testing- KRAS G13D  positive, MSI-stable, intermediate tumor burden, BRAF negative  Cycle 1 FOLFOX 09/29/2016  Cycle 2 FOLFOX 10/13/2016  Cycle 3 FOLFOX 10/27/2016-leucovorin added  2. Iron deficiency anemia secondary to #1  Red cell transfusion 09/24/2016   IV iron 09/27/2016  2 units of packed red blood cells 10/16/2016  3. Chronic renal insufficiency  4. Polyps noted on the colonoscopy 09/07/2016 including a polyp with high-grade dysplasia  5. Hypertension  6. Family history of multiple cancers including pancreas cancer  7. Lipidemia  8. Hyperbilirubinemia secondary to extensive metastatic disease involving the liver  CT abdomen 09/29/2016 with stable mild intrahepatic biliary dilatation.  Bilirubin improved 10/06/2016, 10/13/2016  9. Bilateral knee effusions left greater than right, erythema/tenderness overlying the right patella 10/13/2016-improved  10. Admission with altered mental status 10/15/2016    Disposition:  She appears well. The plan is to proceed with cycle 3 FOLFOX today. The transaminases and bilirubin remain elevated. Her overall status has improved and she has tolerated the chemotherapy well. The plan is to leucovorin to the FOLFOX regimen today. She will contact us for new symptoms following this cycle of chemotherapy. We will follow-up on the CEA from today. Ms. Selmer will return for an office visit and chemotherapy in 2 weeks. The plan is to schedule a restaging CT after cycle 5 for cycle 6.  25 minutes were spent with the patient today. The majority of the time was used for counseling and coordination of care.  Betsy Coder, MD  10/27/2016  9:28 AM

## 2016-10-27 NOTE — Progress Notes (Signed)
Per Dr Benay Spice ok to treat with bilirubin 4.88.

## 2016-10-27 NOTE — Patient Instructions (Addendum)
Return to [facility] on Thursday, 10/29/2016 @ 11:30 am to have your infusion pump disconnected.  Discharge Instructions for Patients Receiving Chemotherapy  Today you received the following chemotherapy agents: Oxaliplatin, 5FU, leucovorin  To help prevent nausea and vomiting after your treatment, we encourage you to take your nausea medication as prescribed.    If you develop nausea and vomiting that is not controlled by your nausea medication, call the clinic.   BELOW ARE SYMPTOMS THAT SHOULD BE REPORTED IMMEDIATELY:  *FEVER GREATER THAN 100.5 F  *CHILLS WITH OR WITHOUT FEVER  NAUSEA AND VOMITING THAT IS NOT CONTROLLED WITH YOUR NAUSEA MEDICATION  *UNUSUAL SHORTNESS OF BREATH  *UNUSUAL BRUISING OR BLEEDING  TENDERNESS IN MOUTH AND THROAT WITH OR WITHOUT PRESENCE OF ULCERS  *URINARY PROBLEMS  *BOWEL PROBLEMS  UNUSUAL RASH Items with * indicate a potential emergency and should be followed up as soon as possible.  Feel free to call the clinic you have any questions or concerns. The clinic phone number is [phone].  Please show the [place] at check-in to the Emergency Department and triage nurse.  Oxaliplatin Injection What is this medicine? OXALIPLATIN (ox AL i PLA tin) is a chemotherapy drug. It targets fast dividing cells, like cancer cells, and causes these cells to die. This medicine is used to treat cancers of the colon and rectum, and many other cancers. This medicine may be used for other purposes; ask your health care provider or pharmacist if you have questions. COMMON BRAND NAME(S): Eloxatin What should I tell my health care provider before I take this medicine? They need to know if you have any of these conditions: -kidney disease -an unusual or allergic reaction to oxaliplatin, other chemotherapy, other medicines, foods, dyes, or preservatives -pregnant or trying to get pregnant -breast-feeding How should I use this medicine? This  drug is given as an infusion into a vein. It is administered in a hospital or clinic by a specially trained health care professional. Talk to your pediatrician regarding the use of this medicine in children. Special care may be needed. Overdosage: If you think you have taken too much of this medicine contact a poison control center or emergency room at once. NOTE: This medicine is only for you. Do not share this medicine with others. What if I miss a dose? It is important not to miss a dose. Call your doctor or health care professional if you are unable to keep an appointment. What may interact with this medicine? -medicines to increase blood counts like filgrastim, pegfilgrastim, sargramostim -probenecid -some antibiotics like amikacin, gentamicin, neomycin, polymyxin B, streptomycin, tobramycin -zalcitabine Talk to your doctor or health care professional before taking any of these medicines: -acetaminophen -aspirin -ibuprofen -ketoprofen -naproxen This list may not describe all possible interactions. Give your health care provider a list of all the medicines, herbs, non-prescription drugs, or dietary supplements you use. Also tell them if you smoke, drink alcohol, or use illegal drugs. Some items may interact with your medicine. What should I watch for while using this medicine? Your condition will be monitored carefully while you are receiving this medicine. You will need important blood work done while you are taking this medicine. This medicine can make you more sensitive to cold. Do not drink cold drinks or use ice. Cover exposed skin before coming in contact with cold temperatures or cold objects. When out in cold weather wear warm clothing and cover your mouth and nose to warm the air that  goes into your lungs. Tell your doctor if you get sensitive to the cold. This drug may make you feel generally unwell. This is not uncommon, as chemotherapy can affect healthy cells as well as cancer  cells. Report any side effects. Continue your course of treatment even though you feel ill unless your doctor tells you to stop. In some cases, you may be given additional medicines to help with side effects. Follow all directions for their use. Call your doctor or health care professional for advice if you get a fever, chills or sore throat, or other symptoms of a cold or flu. Do not treat yourself. This drug decreases your body's ability to fight infections. Try to avoid being around people who are sick. This medicine may increase your risk to bruise or bleed. Call your doctor or health care professional if you notice any unusual bleeding. Be careful brushing and flossing your teeth or using a toothpick because you may get an infection or bleed more easily. If you have any dental work done, tell your dentist you are receiving this medicine. Avoid taking products that contain aspirin, acetaminophen, ibuprofen, naproxen, or ketoprofen unless instructed by your doctor. These medicines may hide a fever. Do not become pregnant while taking this medicine. Women should inform their doctor if they wish to become pregnant or think they might be pregnant. There is a potential for serious side effects to an unborn child. Talk to your health care professional or pharmacist for more information. Do not breast-feed an infant while taking this medicine. Call your doctor or health care professional if you get diarrhea. Do not treat yourself. What side effects may I notice from receiving this medicine? Side effects that you should report to your doctor or health care professional as soon as possible: -allergic reactions like skin rash, itching or hives, swelling of the face, lips, or tongue -low blood counts - This drug may decrease the number of white blood cells, red blood cells and platelets. You may be at increased risk for infections and bleeding. -signs of infection - fever or chills, cough, sore throat, pain or  difficulty passing urine -signs of decreased platelets or bleeding - bruising, pinpoint red spots on the skin, black, tarry stools, nosebleeds -signs of decreased red blood cells - unusually weak or tired, fainting spells, lightheadedness -breathing problems -chest pain, pressure -cough -diarrhea -jaw tightness -mouth sores -nausea and vomiting -pain, swelling, redness or irritation at the injection site -pain, tingling, numbness in the hands or feet -problems with balance, talking, walking -redness, blistering, peeling or loosening of the skin, including inside the mouth -trouble passing urine or change in the amount of urine Side effects that usually do not require medical attention (report to your doctor or health care professional if they continue or are bothersome): -changes in vision -constipation -hair loss -loss of appetite -metallic taste in the mouth or changes in taste -stomach pain This list may not describe all possible side effects. Call your doctor for medical advice about side effects. You may report side effects to FDA at 1-800-FDA-1088. Where should I keep my medicine? This drug is given in a hospital or clinic and will not be stored at home. NOTE: This sheet is a summary. It may not cover all possible information. If you have questions about this medicine, talk to your doctor, pharmacist, or health care provider.  2018 [employer]/Gold Standard (2008-02-07 17:22:47)  Fluorouracil, 5-FU injection What is this medicine? FLUOROURACIL, 5-FU (flure oh YOOR a sil) is  a chemotherapy drug. It slows the growth of cancer cells. This medicine is used to treat many types of cancer like breast cancer, colon or rectal cancer, pancreatic cancer, and stomach cancer. This medicine may be used for other purposes; ask your health care provider or pharmacist if you have questions. COMMON BRAND NAME(S): Adrucil What should I tell my health care provider before I take this medicine? They  need to know if you have any of these conditions: -blood disorders -dihydropyrimidine dehydrogenase (DPD) deficiency -infection (especially a virus infection such as chickenpox, cold sores, or herpes) -kidney disease -liver disease -malnourished, poor nutrition -recent or ongoing radiation therapy -an unusual or allergic reaction to fluorouracil, other chemotherapy, other medicines, foods, dyes, or preservatives -pregnant or trying to get pregnant -breast-feeding How should I use this medicine? This drug is given as an infusion or injection into a vein. It is administered in a hospital or clinic by a specially trained health care professional. Talk to your pediatrician regarding the use of this medicine in children. Special care may be needed. Overdosage: If you think you have taken too much of this medicine contact a poison control center or emergency room at once. NOTE: This medicine is only for you. Do not share this medicine with others. What if I miss a dose? It is important not to miss your dose. Call your doctor or health care professional if you are unable to keep an appointment. What may interact with this medicine? -allopurinol -cimetidine -dapsone -digoxin -hydroxyurea -leucovorin -levamisole -medicines for seizures like ethotoin, fosphenytoin, phenytoin -medicines to increase blood counts like filgrastim, pegfilgrastim, sargramostim -medicines that treat or prevent blood clots like warfarin, enoxaparin, and dalteparin -methotrexate -metronidazole -pyrimethamine -some other chemotherapy drugs like busulfan, cisplatin, estramustine, vinblastine -trimethoprim -trimetrexate -vaccines Talk to your doctor or health care professional before taking any of these medicines: -acetaminophen -aspirin -ibuprofen -ketoprofen -naproxen This list may not describe all possible interactions. Give your health care provider a list of all the medicines, herbs, non-prescription drugs,  or dietary supplements you use. Also tell them if you smoke, drink alcohol, or use illegal drugs. Some items may interact with your medicine. What should I watch for while using this medicine? Visit your doctor for checks on your progress. This drug may make you feel generally unwell. This is not uncommon, as chemotherapy can affect healthy cells as well as cancer cells. Report any side effects. Continue your course of treatment even though you feel ill unless your doctor tells you to stop. In some cases, you may be given additional medicines to help with side effects. Follow all directions for their use. Call your doctor or health care professional for advice if you get a fever, chills or sore throat, or other symptoms of a cold or flu. Do not treat yourself. This drug decreases your body's ability to fight infections. Try to avoid being around people who are sick. This medicine may increase your risk to bruise or bleed. Call your doctor or health care professional if you notice any unusual bleeding. Be careful brushing and flossing your teeth or using a toothpick because you may get an infection or bleed more easily. If you have any dental work done, tell your dentist you are receiving this medicine. Avoid taking products that contain aspirin, acetaminophen, ibuprofen, naproxen, or ketoprofen unless instructed by your doctor. These medicines may hide a fever. Do not become pregnant while taking this medicine. Women should inform their doctor if they wish to become pregnant or think  they might be pregnant. There is a potential for serious side effects to an unborn child. Talk to your health care professional or pharmacist for more information. Do not breast-feed an infant while taking this medicine. Men should inform their doctor if they wish to father a child. This medicine may lower sperm counts. Do not treat diarrhea with over the counter products. Contact your doctor if you have diarrhea that lasts  more than 2 days or if it is severe and watery. This medicine can make you more sensitive to the sun. Keep out of the sun. If you cannot avoid being in the sun, wear protective clothing and use sunscreen. Do not use sun lamps or tanning beds/booths. What side effects may I notice from receiving this medicine? Side effects that you should report to your doctor or health care professional as soon as possible: -allergic reactions like skin rash, itching or hives, swelling of the face, lips, or tongue -low blood counts - this medicine may decrease the number of white blood cells, red blood cells and platelets. You may be at increased risk for infections and bleeding. -signs of infection - fever or chills, cough, sore throat, pain or difficulty passing urine -signs of decreased platelets or bleeding - bruising, pinpoint red spots on the skin, black, tarry stools, blood in the urine -signs of decreased red blood cells - unusually weak or tired, fainting spells, lightheadedness -breathing problems -changes in vision -chest pain -mouth sores -nausea and vomiting -pain, swelling, redness at site where injected -pain, tingling, numbness in the hands or feet -redness, swelling, or sores on hands or feet -stomach pain -unusual bleeding Side effects that usually do not require medical attention (report to your doctor or health care professional if they continue or are bothersome): -changes in finger or toe nails -diarrhea -dry or itchy skin -hair loss -headache -loss of appetite -sensitivity of eyes to the light -stomach upset -unusually teary eyes This list may not describe all possible side effects. Call your doctor for medical advice about side effects. You may report side effects to FDA at 1-800-FDA-1088. Where should I keep my medicine? This drug is given in a hospital or clinic and will not be stored at home. NOTE: This sheet is a summary. It may not cover all possible information. If you  have questions about this medicine, talk to your doctor, pharmacist, or health care provider.  2018 Elsevier/Gold Standard (2007-11-16 13:53:16)   Leucovorin injection What is this medicine? LEUCOVORIN (loo koe VOR in) is used to prevent or treat the harmful effects of some medicines. This medicine is used to treat anemia caused by a low amount of folic acid in the body. It is also used with 5-fluorouracil (5-FU) to treat colon cancer. This medicine may be used for other purposes; ask your health care provider or pharmacist if you have questions. What should I tell my health care provider before I take this medicine? They need to know if you have any of these conditions: -anemia from low levels of vitamin B-12 in the blood -an unusual or allergic reaction to leucovorin, folic acid, other medicines, foods, dyes, or preservatives -pregnant or trying to get pregnant -breast-feeding How should I use this medicine? This medicine is for injection into a muscle or into a vein. It is given by a health care professional in a hospital or clinic setting. Talk to your pediatrician regarding the use of this medicine in children. Special care may be needed. Overdosage: If you think you  have taken too much of this medicine contact a poison control center or emergency room at once. NOTE: This medicine is only for you. Do not share this medicine with others. What if I miss a dose? This does not apply. What may interact with this medicine? -capecitabine -fluorouracil -phenobarbital -phenytoin -primidone -trimethoprim-sulfamethoxazole This list may not describe all possible interactions. Give your health care provider a list of all the medicines, herbs, non-prescription drugs, or dietary supplements you use. Also tell them if you smoke, drink alcohol, or use illegal drugs. Some items may interact with your medicine. What should I watch for while using this medicine? Your condition will be monitored  carefully while you are receiving this medicine. This medicine may increase the side effects of 5-fluorouracil, 5-FU. Tell your doctor or health care professional if you have diarrhea or mouth sores that do not get better or that get worse. What side effects may I notice from receiving this medicine? Side effects that you should report to your doctor or health care professional as soon as possible: -allergic reactions like skin rash, itching or hives, swelling of the face, lips, or tongue -breathing problems -fever, infection -mouth sores -unusual bleeding or bruising -unusually weak or tired Side effects that usually do not require medical attention (report to your doctor or health care professional if they continue or are bothersome): -constipation or diarrhea -loss of appetite -nausea, vomiting This list may not describe all possible side effects. Call your doctor for medical advice about side effects. You may report side effects to FDA at 1-800-FDA-1088. Where should I keep my medicine? This drug is given in a hospital or clinic and will not be stored at home. NOTE: This sheet is a summary. It may not cover all possible information. If you have questions about this medicine, talk to your doctor, pharmacist, or health care provider.  2018 [employer] Standard (2008-01-17 16:50:29)

## 2016-10-29 ENCOUNTER — Ambulatory Visit (HOSPITAL_BASED_OUTPATIENT_CLINIC_OR_DEPARTMENT_OTHER): Payer: Medicare Other

## 2016-10-29 ENCOUNTER — Ambulatory Visit (HOSPITAL_BASED_OUTPATIENT_CLINIC_OR_DEPARTMENT_OTHER): Payer: Medicare Other | Admitting: Nurse Practitioner

## 2016-10-29 ENCOUNTER — Other Ambulatory Visit: Payer: Medicare Other

## 2016-10-29 ENCOUNTER — Other Ambulatory Visit (HOSPITAL_COMMUNITY)
Admission: AD | Admit: 2016-10-29 | Discharge: 2016-10-29 | Disposition: A | Discharge status: Home / Self Care | Payer: Medicare Other | Source: Ambulatory Visit | Attending: Nurse Practitioner | Admitting: Nurse Practitioner

## 2016-10-29 ENCOUNTER — Encounter: Payer: Self-pay | Admitting: *Deleted

## 2016-10-29 VITALS — BP 135/94 | HR 99 | Temp 97.6°F | Resp 18

## 2016-10-29 DIAGNOSIS — C182 Malignant neoplasm of ascending colon: Secondary | ICD-10-CM

## 2016-10-29 DIAGNOSIS — C189 Malignant neoplasm of colon, unspecified: Secondary | ICD-10-CM

## 2016-10-29 DIAGNOSIS — R404 Transient alteration of awareness: Secondary | ICD-10-CM | POA: Diagnosis not present

## 2016-10-29 DIAGNOSIS — R112 Nausea with vomiting, unspecified: Secondary | ICD-10-CM

## 2016-10-29 LAB — COMPREHENSIVE METABOLIC PANEL
ALBUMIN: 2.3 g/dL — AB (ref 3.5–5.0)
ALT: 68 U/L — AB (ref 14–54)
AST: 80 U/L — AB (ref 15–41)
Alkaline Phosphatase: 412 U/L — ABNORMAL HIGH (ref 38–126)
Anion gap: 9 (ref 5–15)
BUN: 19 mg/dL (ref 6–20)
CHLORIDE: 105 mmol/L (ref 101–111)
CO2: 22 mmol/L (ref 22–32)
CREATININE: 0.77 mg/dL (ref 0.44–1.00)
Calcium: 8.3 mg/dL — ABNORMAL LOW (ref 8.9–10.3)
GFR calc Af Amer: >60 mL/min (ref >60)
GLUCOSE: 94 mg/dL (ref 65–99)
Potassium: 3.8 mmol/L (ref 3.5–5.1)
Sodium: 136 mmol/L (ref 135–145)
Total Bilirubin: 5.4 mg/dL — ABNORMAL HIGH (ref 0.3–1.2)
Total Protein: 6.4 g/dL — ABNORMAL LOW (ref 6.5–8.1)

## 2016-10-29 LAB — CBC WITH DIFFERENTIAL/PLATELET
BASO%: 0.3 % (ref 0.0–2.0)
Basophils Absolute: 0 10*3/uL (ref 0.0–0.1)
EOS%: 0 % (ref 0.0–7.0)
Eosinophils Absolute: 0 10*3/uL (ref 0.0–0.5)
HCT: 33.5 % — ABNORMAL LOW (ref 34.8–46.6)
HGB: 11.1 g/dL — ABNORMAL LOW (ref 11.6–15.9)
LYMPH%: 2.2 % — AB (ref 14.0–49.7)
MCH: 27.6 pg (ref 25.1–34.0)
MCHC: 33 g/dL (ref 31.5–36.0)
MCV: 83.5 fL (ref 79.5–101.0)
MONO#: 0.1 10*3/uL (ref 0.1–0.9)
MONO%: 1.1 % (ref 0.0–14.0)
NEUT#: 11.3 10*3/uL — ABNORMAL HIGH (ref 1.5–6.5)
NEUT%: 96.4 % — AB (ref 38.4–76.8)
Platelets: 292 10*3/uL (ref 145–400)
RBC: 4.02 10*6/uL (ref 3.70–5.45)
RDW: 19.9 % — ABNORMAL HIGH (ref 11.2–14.5)
WBC: 11.7 10*3/uL — AB (ref 3.9–10.3)
lymph#: 0.3 10*3/uL — ABNORMAL LOW (ref 0.9–3.3)

## 2016-10-29 LAB — AMMONIA: AMMONIA: 51 umol/L — AB (ref 9–35)

## 2016-10-29 MED ORDER — DEXAMETHASONE SODIUM PHOSPHATE 10 MG/ML IJ SOLN
5.0000 mg | Freq: Once | INTRAMUSCULAR | Status: AC
  Administered 2016-10-29: 5 mg via INTRAVENOUS

## 2016-10-29 MED ORDER — LORAZEPAM 2 MG/ML IJ SOLN
INTRAMUSCULAR | Status: AC
  Filled 2016-10-29: qty 1

## 2016-10-29 MED ORDER — LORAZEPAM 1 MG PO TABS: 0.5000 mg | ORAL_TABLET | Freq: Once | ORAL | Status: AC

## 2016-10-29 MED ORDER — SODIUM CHLORIDE 0.9 % IV SOLN
Freq: Once | INTRAVENOUS | Status: AC
  Administered 2016-10-29: 12:00:00 via INTRAVENOUS
  Filled 2016-10-29: qty 1000

## 2016-10-29 MED ORDER — DEXAMETHASONE SODIUM PHOSPHATE 10 MG/ML IJ SOLN
INTRAMUSCULAR | Status: AC
  Filled 2016-10-29: qty 1

## 2016-10-29 MED ORDER — SODIUM CHLORIDE 0.9 % IV SOLN: 5.0000 mg | Freq: Once | INTRAVENOUS | Status: DC

## 2016-10-29 MED ORDER — HEPARIN SOD (PORK) LOCK FLUSH 100 UNIT/ML IV SOLN
500.0000 [IU] | Freq: Once | INTRAVENOUS | Status: AC | PRN
  Administered 2016-10-29: 500 [IU]
  Filled 2016-10-29: qty 5

## 2016-10-29 MED ORDER — SODIUM CHLORIDE 0.9% FLUSH
10.0000 mL | INTRAVENOUS | Status: DC | PRN
  Administered 2016-10-29: 10 mL
  Filled 2016-10-29: qty 10

## 2016-10-29 MED ORDER — LORAZEPAM 2 MG/ML IJ SOLN
0.5000 mg | Freq: Once | INTRAMUSCULAR | Status: AC
  Administered 2016-10-29: 0.5 mg via INTRAVENOUS

## 2016-10-29 MED ORDER — ONDANSETRON HCL 8 MG PO TABS: 8.0000 mg | ORAL_TABLET | Freq: Three times a day (TID) | ORAL | 1 refills | Status: AC | PRN

## 2016-10-29 NOTE — Patient Instructions (Addendum)
ZOFRAN was called in to pharmacy.  Stop taking the Compazine    Implanted [facility] Guide An implanted port is a type of central line that is placed under the skin. Central lines are used to provide IV access when treatment or nutrition needs to be given through a person's veins. Implanted ports are used for long-term IV access. An implanted port may be placed because:  You need IV medicine that would be irritating to the small veins in your hands or arms.  You need long-term IV medicines, such as antibiotics.  You need IV nutrition for a long period.  You need frequent blood draws for lab tests.  You need dialysis. Implanted ports are usually placed in the chest area, but they can also be placed in the upper arm, the abdomen, or the leg. An implanted port has two main parts:  Reservoir. The reservoir is round and will appear as a small, raised area under your skin. The reservoir is the part where a needle is inserted to give medicines or draw blood.  Catheter. The catheter is a thin, flexible tube that extends from the reservoir. The catheter is placed into a large vein. Medicine that is inserted into the reservoir goes into the catheter and then into the vein. How will I care for my incision site? Do not get the incision site wet. Bathe or shower as directed by your health care provider. How is my port accessed? Special steps must be taken to access the port:  Before the port is accessed, a numbing cream can be placed on the skin. This helps numb the skin over the port site.  Your health care provider uses a sterile technique to access the port.  Your health care provider must put on a mask and sterile gloves.  The skin over your port is cleaned carefully with an antiseptic and allowed to dry.  The port is gently pinched between sterile gloves, and a needle is inserted into the port.  Only "non-coring" port needles should be used to access the port. Once the port is accessed,  a blood return should be checked. This helps ensure that the port is in the vein and is not clogged.  If your port needs to remain accessed for a constant infusion, a clear (transparent) bandage will be placed over the needle site. The bandage and needle will need to be changed every week, or as directed by your health care provider.  Keep the bandage covering the needle clean and dry. Do not get it wet. Follow your health care provider's instructions on how to take a shower or bath while the port is accessed.  If your port does not need to stay accessed, no bandage is needed over the port. What is flushing? Flushing helps keep the port from getting clogged. Follow your health care provider's instructions on how and when to flush the port. Ports are usually flushed with saline solution or a medicine called heparin. The need for flushing will depend on how the port is used.  If the port is used for intermittent medicines or blood draws, the port will need to be flushed:  After medicines have been given.  After blood has been drawn.  As part of routine maintenance.  If a constant infusion is running, the port may not need to be flushed. How long will my port stay implanted? The port can stay in for as long as your health care provider thinks it is needed. When it is  time for the port to come out, surgery will be done to remove it. The procedure is similar to the one performed when the port was put in. When should I seek immediate medical care? When you have an implanted port, you should seek immediate medical care if:  You notice a bad smell coming from the incision site.  You have swelling, redness, or drainage at the incision site.  You have more swelling or pain at the port site or the surrounding area.  You have a fever that is not controlled with medicine. This information is not intended to replace advice given to you by your health care provider. Make sure you discuss any  questions you have with your health care provider. Document Released: 07/13/2005 Document Revised: 12/19/2015 Document Reviewed: 03/20/2013 Elsevier Interactive Patient Education  2017 Elsevier Inc[employer]     Dehydration, Adult Dehydration is when there is not enough fluid or water in your body. This happens when you lose more fluids than you take in. Dehydration can range from mild to very bad. It should be treated right away to keep it from getting very bad. Symptoms of mild dehydration may include:   Thirst.  Dry lips.  Slightly dry mouth.  Dry, warm skin.  Dizziness. Symptoms of moderate dehydration may include:   Very dry mouth.  Muscle cramps.  Dark pee (urine). Pee may be the color of tea.  Your body making less pee.  Your eyes making fewer tears.  Heartbeat that is uneven or faster than normal (palpitations).  Headache.  Light-headedness, especially when you stand up from sitting.  Fainting (syncope). Symptoms of very bad dehydration may include:   Changes in skin, such as:  Cold and clammy skin.  Blotchy (mottled) or pale skin.  Skin that does not quickly return to normal after being lightly pinched and let go (poor skin turgor).  Changes in body fluids, such as:  Feeling very thirsty.  Your eyes making fewer tears.  Not sweating when body temperature is high, such as in hot weather.  Your body making very little pee.  Changes in vital signs, such as:  Weak pulse.  Pulse that is more than 100 beats a minute when you are sitting still.  Fast breathing.  Low blood pressure.  Other changes, such as:  Sunken eyes.  Cold hands and feet.  Confusion.  Lack of energy (lethargy).  Trouble waking up from sleep.  Short-term weight loss.  Unconsciousness. Follow these instructions at home:  If told by your doctor, drink an ORS:  Make an ORS by using instructions on the package.  Start by drinking small amounts, about  cup (120 mL)  every 5-10 minutes.  Slowly drink more until you have had the amount that your doctor said to have.  Drink enough clear fluid to keep your pee clear or pale yellow. If you were told to drink an ORS, finish the ORS first, then start slowly drinking clear fluids. Drink fluids such as:  Water. Do not drink only water by itself. Doing that can make the salt (sodium) level in your body get too low (hyponatremia).  Ice chips.  Fruit juice that you have added water to (diluted).  Low-calorie sports drinks.  Avoid:  Alcohol.  Drinks that have a lot of sugar. These include high-calorie sports drinks, fruit juice that does not have water added, and soda.  Caffeine.  Foods that are greasy or have a lot of fat or sugar.  Take over-the-counter and prescription  medicines only as told by your doctor.  Do not take salt tablets. Doing that can make the salt level in your body get too high (hypernatremia).  Eat foods that have minerals (electrolytes). Examples include bananas, oranges, potatoes, tomatoes, and spinach.  Keep all follow-up visits as told by your doctor. This is important. Contact a doctor if:  You have belly (abdominal) pain that:  Gets worse.  Stays in one area (localizes).  You have a rash.  You have a stiff neck.  You get angry or annoyed more easily than normal (irritability).  You are more sleepy than normal.  You have a harder time waking up than normal.  You feel:  Weak.  Dizzy.  Very thirsty.  You have peed (urinated) only a small amount of very dark pee during 6-8 hours. Get help right away if:  You have symptoms of very bad dehydration.  You cannot drink fluids without throwing up (vomiting).  Your symptoms get worse with treatment.  You have a fever.  You have a very bad headache.  You are throwing up or having watery poop (diarrhea) and it:  Gets worse.  Does not go away.  You have blood or something green (bile) in your  throw-up.  You have blood in your poop (stool). This may cause poop to look black and tarry.  You have not peed in 6-8 hours.  You pass out (faint).  Your heart rate when you are sitting still is more than 100 beats a minute.  You have trouble breathing. This information is not intended to replace advice given to you by your health care provider. Make sure you discuss any questions you have with your health care provider. Document Released: 05/09/2009 Document Revised: 01/31/2016 Document Reviewed: 09/06/2015 Elsevier Interactive Patient Education  2017 [employer].

## 2016-10-29 NOTE — Progress Notes (Signed)
Pt is here with son for pump d/c. Son stated Pt has not eaten or had anything to drink in the past 24hrs, pt has been very nauseas, vomited 1 time in the past 24 hours, is very weak and having trouble doing ADL's, pt is a bit confused, son is reminding her of the things that have occurred in the past 24 hours. Per Dr Benay Spice pt is to d/c compazine, is phoning in a script for zofran, is to receive 5mg  decadron in tx area and 1L fluids in tx area today. Pt and son verbalized understanding.   1345 Pt spitting up/vomiting, per Dr Benay Spice to give 0.5mg  ativan SL in tx area. Gave IV ativan to pt, due to vomiting.  Per Dr Benay Spice, Selena Lesser NP saw pt in tx area. Ordered labs to be drawn STAT, drew them from port. Per Dr Benay Spice maintain fluids at [facility] rate after 1L is finished.   1500 Pt continues  vomiting and disoriented. Will continue to monitor and keep pt comfortable.  37 Pt's son is leaving and his father/ pt's husband is sitting with pt. Pt is asleep.   1600 Pt taken to bathroom via wheelchair. Pt appears less confused.   [place] NP assessed pt in tx area, will discuss tx plan with Dr Benay Spice, pt's labs are back. Will continue to monitor.

## 2016-10-29 NOTE — Progress Notes (Unsigned)
Critical value bilirubin 5.4 taken to [facility]. [facility] at 4:20 pm 10-29-2016 SDD

## 2016-10-30 ENCOUNTER — Telehealth: Payer: Self-pay

## 2016-10-30 ENCOUNTER — Ambulatory Visit (HOSPITAL_BASED_OUTPATIENT_CLINIC_OR_DEPARTMENT_OTHER): Payer: Medicare Other | Admitting: Nurse Practitioner

## 2016-10-30 ENCOUNTER — Encounter: Payer: Self-pay | Admitting: Nurse Practitioner

## 2016-10-30 VITALS — BP 139/92 | HR 78 | Temp 98.0°F | Resp 16 | Ht 65.0 in | Wt 101.0 lb

## 2016-10-30 DIAGNOSIS — R112 Nausea with vomiting, unspecified: Secondary | ICD-10-CM | POA: Diagnosis not present

## 2016-10-30 DIAGNOSIS — C182 Malignant neoplasm of ascending colon: Secondary | ICD-10-CM

## 2016-10-30 DIAGNOSIS — R404 Transient alteration of awareness: Secondary | ICD-10-CM

## 2016-10-30 DIAGNOSIS — C189 Malignant neoplasm of colon, unspecified: Secondary | ICD-10-CM

## 2016-10-30 DIAGNOSIS — C799 Secondary malignant neoplasm of unspecified site: Secondary | ICD-10-CM

## 2016-10-30 MED ORDER — LACTULOSE 10 GM/15ML PO SOLN: 20.0000 g | Freq: Two times a day (BID) | ORAL | 0 refills | Status: DC

## 2016-10-30 NOTE — Progress Notes (Signed)
SYMPTOM MANAGEMENT CLINIC    Chief Complaint: Altered level of consciousness.  HPI:  Lisa Guerra 71 y.o. female diagnosed with colon cancer.  Currently undergoing FOLFOX chemotherapy therapy regimen.    No history exists.    Review of Systems  Constitutional: Positive for malaise/fatigue and weight loss.  Gastrointestinal: Positive for nausea and vomiting.  Neurological: Positive for loss of consciousness and weakness.  All other systems reviewed and are negative.   Past Medical History:  Diagnosis Date  . Anemia   . Colon cancer ([place])   . High cholesterol   . Hypertension   . Low hemoglobin     Past Surgical History:  Procedure Laterality Date  . CESAREAN SECTION     x2   . COLONOSCOPY N/A 09/07/2016   Dr. Wynetta Emery with [name]: two 5 mm polyps in descending colon, large tumor consistent with adenocarcinoma filled cecum/ascending colon, large multi-lobulated ulcerated polypoid lesion at recto-sigmoid junction 20 cm from anal verge.   . IR GENERIC HISTORICAL  09/24/2016   IR FLUORO GUIDE PORT INSERTION RIGHT 09/24/2016 Arne Cleveland, MD [facility]  . IR GENERIC HISTORICAL  09/24/2016   IR US GUIDE VASC ACCESS RIGHT 09/24/2016 Arne Cleveland, MD [facility]  . IR GENERIC HISTORICAL  09/24/2016   IR US GUIDE BX ASP/DRAIN 09/24/2016 Arne Cleveland, MD [facility]  . TONSILLECTOMY    . TUBAL LIGATION      has Colon cancer ([place]); Chest pain; Anemia; Essential hypertension; Metastatic cancer ([place]); Elevated LFTs; Anemia of chronic disease; Protein-calorie malnutrition, severe; Iron deficiency anemia due to chronic blood loss; Goals of care, counseling/discussion; Acute encephalopathy; UTI (urinary tract infection): Probable; Altered level of consciousness; and Nausea with vomiting on her problem list.    has No Known Allergies.  Allergies as of 10/29/2016   No Known Allergies     Medication List       Accurate as of 10/29/16 11:59 PM. Always use your most recent med  list.          amLODipine 5 MG tablet Commonly known as:  NORVASC Take 5 mg by mouth daily.   feeding supplement (ENSURE COMPLETE) Liqd Take 237 mLs by mouth 2 (two) times daily between meals.   ferrous sulfate 325 (65 FE) MG tablet Take 1 tablet (325 mg total) by mouth 3 (three) times daily with meals.   lactulose 10 GM/15ML solution Commonly known as:  CHRONULAC Take 30 mLs (20 g total) by mouth 2 (two) times daily.   lidocaine-prilocaine cream Commonly known as:  EMLA Apply 1 application topically as needed. Apply to portacath 1-2 hours prior to use   ondansetron 8 MG tablet Commonly known as:  ZOFRAN Take 1 tablet (8 mg total) by mouth every 8 (eight) hours as needed for nausea or vomiting.   oxyCODONE 5 MG immediate release tablet Commonly known as:  Oxy IR/ROXICODONE Take 1 tablet (5 mg total) by mouth every 4 (four) hours as needed for moderate pain.   senna-docusate 8.6-50 MG tablet Commonly known as:  Senokot-S Take 1 tablet by mouth 2 (two) times daily.        PHYSICAL EXAMINATION  Oncology Vitals 10/30/2016 10/29/2016  Height 165 cm -  Weight 45.813 kg -  Weight (lbs) 101 lbs -  BMI (kg/m2) 16.81 kg/m2 -  Temp 98 97.6  Pulse 78 99  Resp 16 18  SpO2 100 100  BSA (m2) 1.45 m2 -   BP Readings from Last 2 Encounters:  10/30/16 Mar[place] Kitchen)  139/92  10/29/16 (!) 135/94    Physical Exam  Constitutional: She is oriented to person, place, and time. She appears malnourished and dehydrated. She appears unhealthy. She appears cachectic.  HENT:  Head: Normocephalic and atraumatic.  Mouth/Throat: Oropharynx is clear and moist.  Eyes: Conjunctivae and EOM are normal. Pupils are equal, round, and reactive to light. Right eye exhibits no discharge. Left eye exhibits no discharge. Scleral icterus is present.  Neck: Normal range of motion. Neck supple. No JVD present. No tracheal deviation present. No thyromegaly present.  Cardiovascular: Normal rate, regular rhythm, normal  heart sounds and intact distal pulses.   Pulmonary/Chest: Effort normal and breath sounds normal. No respiratory distress. She has no wheezes. She has no rales. She exhibits no tenderness.  Abdominal: Soft. Bowel sounds are normal. She exhibits no distension and no mass. There is no tenderness. There is no rebound and no guarding.  Musculoskeletal: Normal range of motion. She exhibits no edema or tenderness.  Lymphadenopathy:    She has no cervical adenopathy.  Neurological: She is alert and oriented to person, place, and time. Gait normal.  Skin: Skin is warm and dry. No rash noted. No erythema. No pallor.  Psychiatric: Affect normal.  Nursing note and vitals reviewed.   LABORATORY DATA:. Hospital Outpatient Visit on 10/29/2016  Component Date Value Ref Range Status  . Sodium 10/29/2016 136  135 - 145 mmol/L Final  . Potassium 10/29/2016 3.8  3.5 - 5.1 mmol/L Final  . Chloride 10/29/2016 105  101 - 111 mmol/L Final  . CO2 10/29/2016 22  22 - 32 mmol/L Final  . Glucose, Bld 10/29/2016 94  65 - 99 mg/dL Final  . BUN 10/29/2016 19  6 - 20 mg/dL Final  . Creatinine, Ser 10/29/2016 0.77  0.44 - 1.00 mg/dL Final  . Calcium 10/29/2016 8.3* 8.9 - 10.3 mg/dL Final  . Total Protein 10/29/2016 6.4* 6.5 - 8.1 g/dL Final  . Albumin 10/29/2016 2.3* 3.5 - 5.0 g/dL Final  . AST 10/29/2016 80* 15 - 41 U/L Final  . ALT 10/29/2016 68* 14 - 54 U/L Final  . Alkaline Phosphatase 10/29/2016 412* 38 - 126 U/L Final  . Total Bilirubin 10/29/2016 5.4* 0.3 - 1.2 mg/dL Final  . GFR calc non Af Amer 10/29/2016 >60  >60 mL/min Final  . GFR calc Af Amer 10/29/2016 >60  >60 mL/min Final   Comment: (NOTE) The eGFR has been calculated using the CKD EPI equation. This calculation has not been validated in all clinical situations. eGFR's persistently <60 mL/min signify possible Chronic Kidney Disease.   . Anion gap 10/29/2016 9  5 - 15 Final  . Ammonia 10/29/2016 51* 9 - 35 umol/L Final  Appointment on  10/29/2016  Component Date Value Ref Range Status  . WBC 10/29/2016 11.7* 3.9 - 10.3 10e3/uL Final  . NEUT# 10/29/2016 11.3* 1.5 - 6.5 10e3/uL Final  . HGB 10/29/2016 11.1* 11.6 - 15.9 g/dL Final  . HCT 10/29/2016 33.5* 34.8 - 46.6 % Final  . Platelets 10/29/2016 292  145 - 400 10e3/uL Final  . MCV 10/29/2016 83.5  79.5 - 101.0 fL Final  . MCH 10/29/2016 27.6  25.1 - 34.0 pg Final  . MCHC 10/29/2016 33.0  31.5 - 36.0 g/dL Final  . RBC 10/29/2016 4.02  3.70 - 5.45 10e6/uL Final  . RDW 10/29/2016 19.9* 11.2 - 14.5 % Final  . lymph# 10/29/2016 0.3* 0.9 - 3.3 10e3/uL Final  . MONO# 10/29/2016 0.1  0.1 - 0.9 10e3/uL  Final  . Eosinophils Absolute 10/29/2016 0.0  0.0 - 0.5 10e3/uL Final  . Basophils Absolute 10/29/2016 0.0  0.0 - 0.1 10e3/uL Final  . NEUT% 10/29/2016 96.4* 38.4 - 76.8 % Final  . LYMPH% 10/29/2016 2.2* 14.0 - 49.7 % Final  . MONO% 10/29/2016 1.1  0.0 - 14.0 % Final  . EOS% 10/29/2016 0.0  0.0 - 7.0 % Final  . BASO% 10/29/2016 0.3  0.0 - 2.0 % Final  Appointment on 10/27/2016  Component Date Value Ref Range Status  . WBC 10/27/2016 12.3* 3.9 - 10.3 10e3/uL Final  . NEUT# 10/27/2016 10.1* 1.5 - 6.5 10e3/uL Final  . HGB 10/27/2016 10.5* 11.6 - 15.9 g/dL Final  . HCT 10/27/2016 31.8* 34.8 - 46.6 % Final  . Platelets 10/27/2016 291  145 - 400 10e3/uL Final  . MCV 10/27/2016 83.7  79.5 - 101.0 fL Final  . MCH 10/27/2016 27.6  25.1 - 34.0 pg Final  . MCHC 10/27/2016 33.0  31.5 - 36.0 g/dL Final  . RBC 10/27/2016 3.80  3.70 - 5.45 10e6/uL Final  . RDW 10/27/2016 19.9* 11.2 - 14.5 % Final  . lymph# 10/27/2016 1.0  0.9 - 3.3 10e3/uL Final  . MONO# 10/27/2016 1.0* 0.1 - 0.9 10e3/uL Final  . Eosinophils Absolute 10/27/2016 0.0  0.0 - 0.5 10e3/uL Final  . Basophils Absolute 10/27/2016 0.1  0.0 - 0.1 10e3/uL Final  . NEUT% 10/27/2016 82.6* 38.4 - 76.8 % Final  . LYMPH% 10/27/2016 8.1* 14.0 - 49.7 % Final  . MONO% 10/27/2016 8.0  0.0 - 14.0 % Final  . EOS% 10/27/2016 0.4  0.0 - 7.0  % Final  . BASO% 10/27/2016 0.9  0.0 - 2.0 % Final  . Sodium 10/27/2016 138  136 - 145 mEq/L Final  . Potassium 10/27/2016 3.7  3.5 - 5.1 mEq/L Final  . Chloride 10/27/2016 106  98 - 109 mEq/L Final  . CO2 10/27/2016 23  22 - 29 mEq/L Final  . Glucose 10/27/2016 99  70 - 140 mg/dl Final  . BUN 10/27/2016 16.2  7.0 - 26.0 mg/dL Final  . Creatinine 10/27/2016 0.8  0.6 - 1.1 mg/dL Final  . Total Bilirubin 10/27/2016 4.88* 0.20 - 1.20 mg/dL Final  . Alkaline Phosphatase 10/27/2016 460* 40 - 150 U/L Final  . AST 10/27/2016 68* 5 - 34 U/L Final  . ALT 10/27/2016 58* 0 - 55 U/L Final  . Total Protein 10/27/2016 6.1* 6.4 - 8.3 g/dL Final  . Albumin 10/27/2016 2.2* 3.5 - 5.0 g/dL Final  . Calcium 10/27/2016 8.9  8.4 - 10.4 mg/dL Final  . Anion Gap 10/27/2016 9  3 - 11 mEq/L Final  . EGFR 10/27/2016 86* >90 ml/min/1.73 m2 Final  . CEA ([facility]) 10/27/2016 765.15* 0.00 - 5.00 ng/mL Final    RADIOGRAPHIC STUDIES: No results found.  ASSESSMENT/PLAN:    Colon cancer [facility]) Patient received cycle 3 of her FOLFOX chemotherapy on April 13,018.  See further notes for details of today's visit.  Patient is scheduled to return on 11/10/2016 for labs, flush, visit, and her next cycle of chemotherapy.  Altered level of consciousness Patient presented to the [place] today to have her chemotherapy pump discontinued.  Patient's son was with her today; reports that patient became increasingly confused overnight.  She was found on the floor in a confused state at some point as well.  Patient's son states the patient went to the emergency department on 10/15/2016 for the same symptoms.  Head CT obtained  at that time was negative for any acute findings.  On exam today.  Patient initially appeared to be quite withdrawn.  She was able to only moderate incomprehensible sounds.  She was able to only follow very simple commands.   Labs obtained today revealed bilirubin has increased from 4.88 up to 5.4.   Liver enzymes were elevated and it was noted that patient's sclerae were jaundiced bilaterally.  Ammonia level has increased from 41 up to 51.  Patient was given additional IV fluid rehydration; and appear greatly improved from this provider came back for 2 weeks-examine the patient.  Patient had received approximate 1300 ML's normal saline IV fluid rehydration; and was found to be sitting up in the chair, fully awake.  She was laughing and talking with her son and the nurse.   reviewed all symptoms with Dr. Benay Spice; it is unclear exactly what may be causing these transient symptoms.  Patient will be allowed to go home this evening; but they were strongly encouraged to go directly to the emergency department for any worsening symptoms whatsoever.    Nausea with vomiting Patient has been experiencing intermittent nausea and was started heaving on initial exam.   Patient stated understanding of all instructions; and was in agreement with this plan of care. The patient knows to call the clinic with any problems, questions or concerns.   Total time spent with patient was 40 minutes;  with greater than 75 percent of that time spent in face to face counseling regarding patient's symptoms,  and coordination of care and follow up.  Disclaimer:This dictation was prepared with Dragon/digital dictation along with [employer]. Any transcriptional errors that result from this process are unintentional.  Drue Second, NP 10/30/2016

## 2016-10-30 NOTE — Assessment & Plan Note (Signed)
Patient received cycle 3 of her FOLFOX chemotherapy on April 13,018.  See further notes for details of today's visit.  Patient is scheduled to return on 11/10/2016 for labs, flush, visit, and her next cycle of chemotherapy.

## 2016-10-30 NOTE — Assessment & Plan Note (Signed)
Patient presented to the [facility] today to have her chemotherapy pump discontinued.  Patient's son was with her today; reports that patient became increasingly confused overnight.  She was found on the floor in a confused state at some point as well.  Patient's son states the patient went to the emergency department on 10/15/2016 for the same symptoms.  Head CT obtained at that time was negative for any acute findings.  On exam today.  Patient initially appeared to be quite withdrawn.  She was able to only moderate incomprehensible sounds.  She was able to only follow very simple commands.   Labs obtained today revealed bilirubin has increased from 4.88 up to 5.4.  Liver enzymes were elevated and it was noted that patient's sclerae were jaundiced bilaterally.  Ammonia level has increased from 41 up to 51.  Patient was given additional IV fluid rehydration; and appear greatly improved from this provider came back for 2 weeks-examine the patient.  Patient had received approximate 1300 ML's normal saline IV fluid rehydration; and was found to be sitting up in the chair, fully awake.  She was laughing and talking with her son and the nurse.   reviewed all symptoms with Dr. Benay Spice; it is unclear exactly what may be causing these transient symptoms.  Patient will be allowed to go home this evening; but they were strongly encouraged to go directly to the emergency department for any worsening symptoms whatsoever.   ____________________________________________________________________________________  Update: Patient called and then presented back to the [facility] today.  The son reports that patient progressively worsened again last night; and eventually became unsteady and fell to the floor.  She hit the right side of her head and now has a mild to moderate hematoma to the right forehead.  She denies any full loss of consciousness.  She denies any other neurological symptoms whatsoever.  Patient's  son reports the patient once again became very confused overnight.  He confirmed that patient had not been given any medications from the time she left the hospital until 2 in the morning when she requested a Zofran tablet.  This morning-patient appears slightly tired; but is otherwise alert, oriented, and able to follow all commands.  She denies any new symptoms whatsoever.  Dr. Benay Spice in to examine patient as well.  Differential diagnosis for patient's transient symptoms include encephalopathy secondary to elevated liver enzymes and increased ammonia level, dehydration, or medication related.  Carefully reviewed all of patient's medication was with both the patient and her son today.  Patient has been advised to hold any Compazine, narcotics, or anything else.  Medication wise that could cause patient's symptoms.  Also, will prescribe lactulose 20 g, which equals 15 mL twice daily for the next few days to see if this helps.  Patient was strongly encouraged to call/return or go directly to the emergency department over the weekend for any worsening symptoms whatsoever.

## 2016-10-30 NOTE — Telephone Encounter (Signed)
Female family member called that pt was increased disorientation, and dry heaves since 0200. 0500 fell on way to bathroom, more disoriented. Put fingers in her mouth as if taking a pill. Not knowing where she is. Dry heaves again. Is thinking she is drinking water and does not have the water bottle. Dizzy. Pretty much dead weight when getting to bathroom. Not feeling hot but did not take her temp.  s/w Dr Benay Spice and Selena Lesser NP.  Called family back and told them to come to [facility] right away. Possible admission to hospital.  They stated will be about an hour, about 1040

## 2016-10-30 NOTE — Progress Notes (Signed)
SYMPTOM MANAGEMENT CLINIC    Chief Complaint: Altered level of consciousness  HPI:  Lisa Guerra 71 y.o. female diagnosed with colon cancer.  Currently undergoing FOLFOX chemotherapy regimen.    No history exists.    Review of Systems  Constitutional: Positive for malaise/fatigue.  Neurological: Positive for loss of consciousness.  All other systems reviewed and are negative.   Past Medical History:  Diagnosis Date  . Anemia   . Colon cancer ([place])   . High cholesterol   . Hypertension   . Low hemoglobin     Past Surgical History:  Procedure Laterality Date  . CESAREAN SECTION     x2   . COLONOSCOPY N/A 09/07/2016   Dr. Wynetta Emery with [name]: two 5 mm polyps in descending colon, large tumor consistent with adenocarcinoma filled cecum/ascending colon, large multi-lobulated ulcerated polypoid lesion at recto-sigmoid junction 20 cm from anal verge.   . IR GENERIC HISTORICAL  09/24/2016   IR FLUORO GUIDE PORT INSERTION RIGHT 09/24/2016 Arne Cleveland, MD [facility]  . IR GENERIC HISTORICAL  09/24/2016   IR US GUIDE VASC ACCESS RIGHT 09/24/2016 Arne Cleveland, MD [facility]  . IR GENERIC HISTORICAL  09/24/2016   IR US GUIDE BX ASP/DRAIN 09/24/2016 Arne Cleveland, MD [facility]  . TONSILLECTOMY    . TUBAL LIGATION      has Colon cancer ([place]); Chest pain; Anemia; Essential hypertension; Metastatic cancer ([place]); Elevated LFTs; Anemia of chronic disease; Protein-calorie malnutrition, severe; Iron deficiency anemia due to chronic blood loss; Goals of care, counseling/discussion; Acute encephalopathy; UTI (urinary tract infection): Probable; Altered level of consciousness; and Nausea with vomiting on her problem list.    has No Known Allergies.  Allergies as of 10/30/2016   No Known Allergies     Medication List       Accurate as of 10/30/16  6:10 PM. Always use your most recent med list.          amLODipine 5 MG tablet Commonly known as:  NORVASC Take 5 mg by mouth  daily.   feeding supplement (ENSURE COMPLETE) Liqd Take 237 mLs by mouth 2 (two) times daily between meals.   ferrous sulfate 325 (65 FE) MG tablet Take 1 tablet (325 mg total) by mouth 3 (three) times daily with meals.   lactulose 10 GM/15ML solution Commonly known as:  CHRONULAC Take 30 mLs (20 g total) by mouth 2 (two) times daily.   lidocaine-prilocaine cream Commonly known as:  EMLA Apply 1 application topically as needed. Apply to portacath 1-2 hours prior to use   ondansetron 8 MG tablet Commonly known as:  ZOFRAN Take 1 tablet (8 mg total) by mouth every 8 (eight) hours as needed for nausea or vomiting.   oxyCODONE 5 MG immediate release tablet Commonly known as:  Oxy IR/ROXICODONE Take 1 tablet (5 mg total) by mouth every 4 (four) hours as needed for moderate pain.   senna-docusate 8.6-50 MG tablet Commonly known as:  Senokot-S Take 1 tablet by mouth 2 (two) times daily.        PHYSICAL EXAMINATION  Oncology Vitals 10/30/2016 10/29/2016  Height 165 cm -  Weight 45.813 kg -  Weight (lbs) 101 lbs -  BMI (kg/m2) 16.81 kg/m2 -  Temp 98 97.6  Pulse 78 99  Resp 16 18  SpO2 100 100  BSA (m2) 1.45 m2 -   BP Readings from Last 2 Encounters:  10/30/16 (!) 139/92  10/29/16 (!) 135/94    Physical Exam  Constitutional: She is oriented to person, place, and time. She appears malnourished and dehydrated. She appears unhealthy. She appears cachectic.  HENT:  Head: Normocephalic and atraumatic.  Mouth/Throat: Oropharynx is clear and moist.  Eyes: Conjunctivae and EOM are normal. Pupils are equal, round, and reactive to light. Right eye exhibits no discharge. Left eye exhibits no discharge. No scleral icterus.  Neck: Normal range of motion. Neck supple. No JVD present. No tracheal deviation present. No thyromegaly present.  Cardiovascular: Normal rate, regular rhythm, normal heart sounds and intact distal pulses.   Pulmonary/Chest: Effort normal and breath sounds normal.  No respiratory distress. She has no wheezes. She has no rales. She exhibits no tenderness.  Abdominal: Soft. Bowel sounds are normal. She exhibits no distension and no mass. There is no tenderness. There is no rebound and no guarding.  Musculoskeletal: Normal range of motion. She exhibits no edema or tenderness.  Lymphadenopathy:    She has no cervical adenopathy.  Neurological: She is alert and oriented to person, place, and time. Gait normal.  Skin: Skin is warm and dry. No rash noted. No erythema. No pallor.  Psychiatric: Affect normal.  Nursing note and vitals reviewed.   LABORATORY DATA:. Hospital Outpatient Visit on 10/29/2016  Component Date Value Ref Range Status  . Sodium 10/29/2016 136  135 - 145 mmol/L Final  . Potassium 10/29/2016 3.8  3.5 - 5.1 mmol/L Final  . Chloride 10/29/2016 105  101 - 111 mmol/L Final  . CO2 10/29/2016 22  22 - 32 mmol/L Final  . Glucose, Bld 10/29/2016 94  65 - 99 mg/dL Final  . BUN 10/29/2016 19  6 - 20 mg/dL Final  . Creatinine, Ser 10/29/2016 0.77  0.44 - 1.00 mg/dL Final  . Calcium 10/29/2016 8.3* 8.9 - 10.3 mg/dL Final  . Total Protein 10/29/2016 6.4* 6.5 - 8.1 g/dL Final  . Albumin 10/29/2016 2.3* 3.5 - 5.0 g/dL Final  . AST 10/29/2016 80* 15 - 41 U/L Final  . ALT 10/29/2016 68* 14 - 54 U/L Final  . Alkaline Phosphatase 10/29/2016 412* 38 - 126 U/L Final  . Total Bilirubin 10/29/2016 5.4* 0.3 - 1.2 mg/dL Final  . GFR calc non Af Amer 10/29/2016 >60  >60 mL/min Final  . GFR calc Af Amer 10/29/2016 >60  >60 mL/min Final   Comment: (NOTE) The eGFR has been calculated using the CKD EPI equation. This calculation has not been validated in all clinical situations. eGFR's persistently <60 mL/min signify possible Chronic Kidney Disease.   . Anion gap 10/29/2016 9  5 - 15 Final  . Ammonia 10/29/2016 51* 9 - 35 umol/L Final  Appointment on 10/29/2016  Component Date Value Ref Range Status  . WBC 10/29/2016 11.7* 3.9 - 10.3 10e3/uL Final  .  NEUT# 10/29/2016 11.3* 1.5 - 6.5 10e3/uL Final  . HGB 10/29/2016 11.1* 11.6 - 15.9 g/dL Final  . HCT 10/29/2016 33.5* 34.8 - 46.6 % Final  . Platelets 10/29/2016 292  145 - 400 10e3/uL Final  . MCV 10/29/2016 83.5  79.5 - 101.0 fL Final  . MCH 10/29/2016 27.6  25.1 - 34.0 pg Final  . MCHC 10/29/2016 33.0  31.5 - 36.0 g/dL Final  . RBC 10/29/2016 4.02  3.70 - 5.45 10e6/uL Final  . RDW 10/29/2016 19.9* 11.2 - 14.5 % Final  . lymph# 10/29/2016 0.3* 0.9 - 3.3 10e3/uL Final  . MONO# 10/29/2016 0.1  0.1 - 0.9 10e3/uL Final  . Eosinophils Absolute 10/29/2016 0.0  0.0 - 0.5 10e3/uL  Final  . Basophils Absolute 10/29/2016 0.0  0.0 - 0.1 10e3/uL Final  . NEUT% 10/29/2016 96.4* 38.4 - 76.8 % Final  . LYMPH% 10/29/2016 2.2* 14.0 - 49.7 % Final  . MONO% 10/29/2016 1.1  0.0 - 14.0 % Final  . EOS% 10/29/2016 0.0  0.0 - 7.0 % Final  . BASO% 10/29/2016 0.3  0.0 - 2.0 % Final    RADIOGRAPHIC STUDIES: No results found.  ASSESSMENT/PLAN:    Nausea with vomiting Patient had no nausea or vomiting today.  She was able to eat a few crackers with peanut butter and also had some sips of Coke as well.  Colon cancer [facility]) Patient received cycle 3 of her FOLFOX chemotherapy on April 13,018.  See further notes for details of today's visit.  Patient is scheduled to return on 11/10/2016 for labs, flush, visit, and her next cycle of chemotherapy.  Altered level of consciousness Patient presented to the [place] today to have her chemotherapy pump discontinued.  Patient's son was with her today; reports that patient became increasingly confused overnight.  She was found on the floor in a confused state at some point as well.  Patient's son states the patient went to the emergency department on 10/15/2016 for the same symptoms.  Head CT obtained at that time was negative for any acute findings.  On exam today.  Patient initially appeared to be quite withdrawn.  She was able to only moderate incomprehensible  sounds.  She was able to only follow very simple commands.   Labs obtained today revealed bilirubin has increased from 4.88 up to 5.4.  Liver enzymes were elevated and it was noted that patient's sclerae were jaundiced bilaterally.  Ammonia level has increased from 41 up to 51.  Patient was given additional IV fluid rehydration; and appear greatly improved from this provider came back for 2 weeks-examine the patient.  Patient had received approximate 1300 ML's normal saline IV fluid rehydration; and was found to be sitting up in the chair, fully awake.  She was laughing and talking with her son and the nurse.   reviewed all symptoms with Dr. Benay Spice; it is unclear exactly what may be causing these transient symptoms.  Patient will be allowed to go home this evening; but they were strongly encouraged to go directly to the emergency department for any worsening symptoms whatsoever.   ____________________________________________________________________________________  Update: Patient called and then presented back to the [facility] today.  The son reports that patient progressively worsened again last night; and eventually became unsteady and fell to the floor.  She hit the right side of her head and now has a mild to moderate hematoma to the right forehead.  She denies any full loss of consciousness.  She denies any other neurological symptoms whatsoever.  Patient's son reports the patient once again became very confused overnight.  He confirmed that patient had not been given any medications from the time she left the hospital until 2 in the morning when she requested a Zofran tablet.  This morning-patient appears slightly tired; but is otherwise alert, oriented, and able to follow all commands.  She denies any new symptoms whatsoever.  Dr. Benay Spice in to examine patient as well.  Differential diagnosis for patient's transient symptoms include encephalopathy secondary to elevated liver enzymes and  increased ammonia level, dehydration, or medication related.  Carefully reviewed all of patient's medication was with both the patient and her son today.  Patient has been advised to hold any Compazine,  narcotics, or anything else.  Medication wise that could cause patient's symptoms.  Also, will prescribe lactulose 20 g, which equals 15 mL twice daily for the next few days to see if this helps.  Patient was strongly encouraged to call/return or go directly to the emergency department over the weekend for any worsening symptoms whatsoever.   Patient stated understanding of all instructions; and was in agreement with this plan of care. The patient knows to call the clinic with any problems, questions or concerns.   Total time spent with patient was 40 minutes;  with greater than 75 percent of that time spent in face to face counseling regarding patient's symptoms,  and coordination of care and follow up.  Disclaimer:This dictation was prepared with Dragon/digital dictation along with [employer]. Any transcriptional errors that result from this process are unintentional.  Drue Second, NP 10/30/2016  This was a shared visit with Drue Second. Ms. Prothero was interviewed and examined. She had nausea yesterday which has improved.  She has an altered mental status similar to after cycle 2 Folfox.  The etiology is unclear. This could be related to polypharmacy.  I have a low suspicion for a cns event. She may have a degrees of hepatic encephalopathy, but the MS change appears transient. She is oriented and the neurologic exam was nonfocal today.  She appeared stable for discharge with close observation by her family.  Julieanne Manson, MD

## 2016-10-30 NOTE — Assessment & Plan Note (Signed)
Patient has been experiencing intermittent nausea and was started heaving on initial exam.

## 2016-10-30 NOTE — Progress Notes (Signed)
Given snack of saltines and peanut and a coke. Pt ate 1.5 crackers and sips of coke. No nausea.  Ambulated in hallway, required 1 person touch assist. Pt unsteady but able to walk. Pt has walker at home. Advised to use it consistently and to have someone with her at all times. To return Monday for repeat labs and [facility] appt.

## 2016-10-30 NOTE — Assessment & Plan Note (Signed)
Patient presented to the [facility] today to have her chemotherapy pump discontinued.  Patient's son was with her today; reports that patient became increasingly confused overnight.  She was found on the floor in a confused state at some point as well.  Patient's son states the patient went to the emergency department on 10/15/2016 for the same symptoms.  Head CT obtained at that time was negative for any acute findings.  On exam today.  Patient initially appeared to be quite withdrawn.  She was able to only moderate incomprehensible sounds.  She was able to only follow very simple commands.   Labs obtained today revealed bilirubin has increased from 4.88 up to 5.4.  Liver enzymes were elevated and it was noted that patient's sclerae were jaundiced bilaterally.  Ammonia level has increased from 41 up to 51.  Patient was given additional IV fluid rehydration; and appear greatly improved from this provider came back for 2 weeks-examine the patient.  Patient had received approximate 1300 ML's normal saline IV fluid rehydration; and was found to be sitting up in the chair, fully awake.  She was laughing and talking with her son and the nurse.   reviewed all symptoms with Dr. Benay Spice; it is unclear exactly what may be causing these transient symptoms.  Patient will be allowed to go home this evening; but they were strongly encouraged to go directly to the emergency department for any worsening symptoms whatsoever.

## 2016-10-30 NOTE — Assessment & Plan Note (Signed)
Patient had no nausea or vomiting today.  She was able to eat a few crackers with peanut butter and also had some sips of Coke as well.

## 2016-11-02 ENCOUNTER — Telehealth: Payer: Self-pay | Admitting: Oncology

## 2016-11-02 ENCOUNTER — Ambulatory Visit (HOSPITAL_BASED_OUTPATIENT_CLINIC_OR_DEPARTMENT_OTHER): Payer: Medicare Other | Admitting: Nurse Practitioner

## 2016-11-02 ENCOUNTER — Other Ambulatory Visit (HOSPITAL_BASED_OUTPATIENT_CLINIC_OR_DEPARTMENT_OTHER): Payer: Medicare Other

## 2016-11-02 ENCOUNTER — Other Ambulatory Visit (HOSPITAL_COMMUNITY)
Admission: RE | Admit: 2016-11-02 | Discharge: 2016-11-02 | Disposition: A | Discharge status: Home / Self Care | Payer: Medicare Other | Source: Ambulatory Visit | Attending: Oncology | Admitting: Oncology

## 2016-11-02 VITALS — BP 148/100 | HR 78 | Temp 97.8°F | Resp 16 | Ht 65.0 in | Wt 95.1 lb

## 2016-11-02 DIAGNOSIS — E876 Hypokalemia: Secondary | ICD-10-CM

## 2016-11-02 DIAGNOSIS — R112 Nausea with vomiting, unspecified: Secondary | ICD-10-CM

## 2016-11-02 DIAGNOSIS — C189 Malignant neoplasm of colon, unspecified: Secondary | ICD-10-CM | POA: Diagnosis not present

## 2016-11-02 DIAGNOSIS — C799 Secondary malignant neoplasm of unspecified site: Secondary | ICD-10-CM

## 2016-11-02 DIAGNOSIS — R404 Transient alteration of awareness: Secondary | ICD-10-CM | POA: Diagnosis not present

## 2016-11-02 DIAGNOSIS — I10 Essential (primary) hypertension: Secondary | ICD-10-CM

## 2016-11-02 DIAGNOSIS — C182 Malignant neoplasm of ascending colon: Secondary | ICD-10-CM

## 2016-11-02 LAB — COMPREHENSIVE METABOLIC PANEL
ALBUMIN: 2.3 g/dL — AB (ref 3.5–5.0)
ALK PHOS: 793 U/L — AB (ref 40–150)
ALT: 68 U/L — ABNORMAL HIGH (ref 0–55)
AST: 81 U/L — AB (ref 5–34)
Anion Gap: 12 mEq/L — ABNORMAL HIGH (ref 3–11)
BUN: 17.9 mg/dL (ref 7.0–26.0)
CO2: 26 meq/L (ref 22–29)
Calcium: 8.4 mg/dL (ref 8.4–10.4)
Chloride: 103 mEq/L (ref 98–109)
Creatinine: 1 mg/dL (ref 0.6–1.1)
EGFR: 70 mL/min/{1.73_m2} — ABNORMAL LOW (ref >90)
GLUCOSE: 99 mg/dL (ref 70–140)
POTASSIUM: 3.4 meq/L — AB (ref 3.5–5.1)
SODIUM: 142 meq/L (ref 136–145)
Total Bilirubin: 3.95 mg/dL (ref 0.20–1.20)
Total Protein: 6.3 g/dL — ABNORMAL LOW (ref 6.4–8.3)

## 2016-11-02 LAB — CBC WITH DIFFERENTIAL/PLATELET
BASO%: 0.2 % (ref 0.0–2.0)
BASOS ABS: 0 10*3/uL (ref 0.0–0.1)
EOS%: 0.6 % (ref 0.0–7.0)
Eosinophils Absolute: 0 10*3/uL (ref 0.0–0.5)
HCT: 33.4 % — ABNORMAL LOW (ref 34.8–46.6)
HEMOGLOBIN: 10.8 g/dL — AB (ref 11.6–15.9)
LYMPH%: 17.9 % (ref 14.0–49.7)
MCH: 27.1 pg (ref 25.1–34.0)
MCHC: 32.3 g/dL (ref 31.5–36.0)
MCV: 83.9 fL (ref 79.5–101.0)
MONO#: 0.2 10*3/uL (ref 0.1–0.9)
MONO%: 3.4 % (ref 0.0–14.0)
NEUT%: 77.9 % — ABNORMAL HIGH (ref 38.4–76.8)
NEUTROS ABS: 3.9 10*3/uL (ref 1.5–6.5)
Platelets: 198 10*3/uL (ref 145–400)
RBC: 3.98 10*6/uL (ref 3.70–5.45)
RDW: 18.4 % — AB (ref 11.2–14.5)
WBC: 5 10*3/uL (ref 3.9–10.3)
lymph#: 0.9 10*3/uL (ref 0.9–3.3)

## 2016-11-02 LAB — AMMONIA: Ammonia: 28 umol/L (ref 9–35)

## 2016-11-02 NOTE — Telephone Encounter (Signed)
Spoke with spouse re appointments today at 12:15 pm and patient getting new schedule for other appointments at visit today.

## 2016-11-03 ENCOUNTER — Encounter: Payer: Self-pay | Admitting: Nurse Practitioner

## 2016-11-03 DIAGNOSIS — E876 Hypokalemia: Secondary | ICD-10-CM | POA: Insufficient documentation

## 2016-11-03 NOTE — Assessment & Plan Note (Signed)
Patient has a history of hypertension.  Patient's blood pressure on initial check today was up to 148/100.  Patient confirmed that she did not take blood pressure medication today; the plans to take it as soon as she returns home.  Patient was encouraged to take her blood pressure medication as directed; to also check her blood pressure when she gets back home.  She was encouraged to go directly to the emergency department for any worsening symptoms whatsoever.

## 2016-11-03 NOTE — Assessment & Plan Note (Signed)
Patient presented to the [place] today to have her chemotherapy pump discontinued.  Patient's son was with her today; reports that patient became increasingly confused overnight.  She was found on the floor in a confused state at some point as well.  Patient's son states the patient went to the emergency department on 10/15/2016 for the same symptoms.  Head CT obtained at that time was negative for any acute findings.  On exam today.  Patient initially appeared to be quite withdrawn.  She was able to only moderate incomprehensible sounds.  She was able to only follow very simple commands.   Labs obtained today revealed bilirubin has increased from 4.88 up to 5.4.  Liver enzymes were elevated and it was noted that patient's sclerae were jaundiced bilaterally.  Ammonia level has increased from 41 up to 51.  Patient was given additional IV fluid rehydration; and appear greatly improved from this provider came back for 2 weeks-examine the patient.  Patient had received approximate 1300 ML's normal saline IV fluid rehydration; and was found to be sitting up in the chair, fully awake.  She was laughing and talking with her son and the nurse.   reviewed all symptoms with Dr. Benay Spice; it is unclear exactly what may be causing these transient symptoms.  Patient will be allowed to go home this evening; but they were strongly encouraged to go directly to the emergency department for any worsening symptoms whatsoever.   ____________________________________________________________________________________  Update: Patient called and then presented back to the [employer] today.  The son reports that patient progressively worsened again last night; and eventually became unsteady and fell to the floor.  She hit the right side of her head and now has a mild to moderate hematoma to the right forehead.  She denies any full loss of consciousness.  She denies any other neurological symptoms whatsoever.  Patient's  son reports the patient once again became very confused overnight.  He confirmed that patient had not been given any medications from the time she left the hospital until 2 in the morning when she requested a Zofran tablet.  This morning-patient appears slightly tired; but is otherwise alert, oriented, and able to follow all commands.  She denies any new symptoms whatsoever.  Dr. Benay Spice in to examine patient as well.  Differential diagnosis for patient's transient symptoms include encephalopathy secondary to elevated liver enzymes and increased ammonia level, dehydration, or medication related.  Carefully reviewed all of patient's medication was with both the patient and her son today.  Patient has been advised to hold any Compazine, narcotics, or anything else.  Medication wise that could cause patient's symptoms.  Also, will prescribe lactulose 20 g, which equals 15 mL twice daily for the next few days to see if this helps.  Patient was strongly encouraged to call/return or go directly to the emergency department over the weekend for any worsening symptoms whatsoever. ___________________________________________________________  Update: Patient presented back to the [facility] today for recheck.  Both the patient and her son are very happy to report the patient had no further issues with confusion over the weekend.  Of note-patient did not pick up her start taking the lactulose as directed this past Friday.  Also, labs continue to improve when compared from last Friday for labs.  Bilirubin has improved from 5.4 down to 3.95.  Ammonia level has decreased from 51 down to 28.  Patient appears alert and very comfortable today.  She is oriented and communicating well with  the staff.  She was able to drink fluids with no difficulty.  The right for head hematoma is on was completely resolved; patient does have some mild healing bruising around her right eye.  We'll continue to monitor patient closely  in regards to these complaints.

## 2016-11-03 NOTE — Assessment & Plan Note (Signed)
Patient's potassium was down to 3.4 today.  Patient states that she does not like to take the potassium tablets.  She will instead try to push potassium in her diet.  We'll continue to monitor.

## 2016-11-03 NOTE — Assessment & Plan Note (Signed)
Patient had no nausea or vomiting today.  She was able to eat a few crackers with peanut butter and also had some sips of Coke as well. __________________________________________  Update: Patient present back to the [facility] today for a recheck.  She states she continues with some intermittent, chronic nausea and only occasional vomiting.  She takes Zofran as directed.

## 2016-11-03 NOTE — Progress Notes (Signed)
SYMPTOM MANAGEMENT CLINIC    Chief Complaint: Altered level of consciousness  HPI:  Lisa Guerra 71 y.o. female diagnosed with colon cancer.  Currently undergoing FOLFOX chemotherapy regimen.    No history exists.    Review of Systems  Constitutional: Positive for malaise/fatigue.  Neurological: Positive for loss of consciousness.  All other systems reviewed and are negative.   Past Medical History:  Diagnosis Date  . Anemia   . Colon cancer ([place])   . High cholesterol   . Hypertension   . Low hemoglobin     Past Surgical History:  Procedure Laterality Date  . CESAREAN SECTION     x2   . COLONOSCOPY N/A 09/07/2016   Dr. Wynetta Emery with [name]: two 5 mm polyps in descending colon, large tumor consistent with adenocarcinoma filled cecum/ascending colon, large multi-lobulated ulcerated polypoid lesion at recto-sigmoid junction 20 cm from anal verge.   . IR GENERIC HISTORICAL  09/24/2016   IR FLUORO GUIDE PORT INSERTION RIGHT 09/24/2016 Arne Cleveland, MD [facility]  . IR GENERIC HISTORICAL  09/24/2016   IR US GUIDE VASC ACCESS RIGHT 09/24/2016 Arne Cleveland, MD [facility]  . IR GENERIC HISTORICAL  09/24/2016   IR US GUIDE BX ASP/DRAIN 09/24/2016 Arne Cleveland, MD [facility]  . TONSILLECTOMY    . TUBAL LIGATION      has Colon cancer ([place]); Chest pain; Anemia; Essential hypertension; Metastatic cancer ([place]); Elevated LFTs; Anemia of chronic disease; Protein-calorie malnutrition, severe; Iron deficiency anemia due to chronic blood loss; Goals of care, counseling/discussion; Acute encephalopathy; UTI (urinary tract infection): Probable; Altered level of consciousness; Nausea with vomiting; and Hypokalemia on her problem list.    has No Known Allergies.  Allergies as of 11/02/2016   No Known Allergies     Medication List       Accurate as of 11/02/16 11:59 PM. Always use your most recent med list.          amLODipine 5 MG tablet Commonly known as:  NORVASC Take 5  mg by mouth daily.   feeding supplement (ENSURE COMPLETE) Liqd Take 237 mLs by mouth 2 (two) times daily between meals.   ferrous sulfate 325 (65 FE) MG tablet Take 1 tablet (325 mg total) by mouth 3 (three) times daily with meals.   lactulose 10 GM/15ML solution Commonly known as:  CHRONULAC Take 30 mLs (20 g total) by mouth 2 (two) times daily.   lidocaine-prilocaine cream Commonly known as:  EMLA Apply 1 application topically as needed. Apply to portacath 1-2 hours prior to use   ondansetron 8 MG tablet Commonly known as:  ZOFRAN Take 1 tablet (8 mg total) by mouth every 8 (eight) hours as needed for nausea or vomiting.   oxyCODONE 5 MG immediate release tablet Commonly known as:  Oxy IR/ROXICODONE Take 1 tablet (5 mg total) by mouth every 4 (four) hours as needed for moderate pain.   senna-docusate 8.6-50 MG tablet Commonly known as:  Senokot-S Take 1 tablet by mouth 2 (two) times daily.        PHYSICAL EXAMINATION  Oncology Vitals 11/02/2016 10/30/2016  Height 165 cm 165 cm  Weight 43.137 kg 45.813 kg  Weight (lbs) 95 lbs 2 oz 101 lbs  BMI (kg/m2) 15.83 kg/m2 16.81 kg/m2  Temp 97.8 98  Pulse 78 78  Resp 16 16  SpO2 100 100  BSA (m2) 1.41 m2 1.45 m2   BP Readings from Last 2 Encounters:  11/02/16 (!) 148/100  10/30/16 Marland Kitchen)  139/92    Physical Exam  Constitutional: She is oriented to person, place, and time. She appears malnourished and dehydrated. She appears unhealthy. She appears cachectic.  HENT:  Head: Normocephalic and atraumatic.  Mouth/Throat: Oropharynx is clear and moist.  Eyes: Conjunctivae and EOM are normal. Pupils are equal, round, and reactive to light. Right eye exhibits no discharge. Left eye exhibits no discharge. No scleral icterus.  Neck: Normal range of motion. Neck supple. No JVD present. No tracheal deviation present. No thyromegaly present.  Cardiovascular: Normal rate, regular rhythm, normal heart sounds and intact distal pulses.     Pulmonary/Chest: Effort normal and breath sounds normal. No respiratory distress. She has no wheezes. She has no rales. She exhibits no tenderness.  Abdominal: Soft. Bowel sounds are normal. She exhibits no distension and no mass. There is no tenderness. There is no rebound and no guarding.  Musculoskeletal: Normal range of motion. She exhibits no edema or tenderness.  Lymphadenopathy:    She has no cervical adenopathy.  Neurological: She is alert and oriented to person, place, and time. Gait normal.  Skin: Skin is warm and dry. No rash noted. No erythema. No pallor.  Psychiatric: Affect normal.  Nursing note and vitals reviewed.   LABORATORY DATA:. Hospital Outpatient Visit on 11/02/2016  Component Date Value Ref Range Status  . Ammonia 11/02/2016 28  9 - 35 umol/L Final  Appointment on 11/02/2016  Component Date Value Ref Range Status  . WBC 11/02/2016 5.0  3.9 - 10.3 10e3/uL Final  . NEUT# 11/02/2016 3.9  1.5 - 6.5 10e3/uL Final  . HGB 11/02/2016 10.8* 11.6 - 15.9 g/dL Final  . HCT 11/02/2016 33.4* 34.8 - 46.6 % Final  . Platelets 11/02/2016 198  145 - 400 10e3/uL Final  . MCV 11/02/2016 83.9  79.5 - 101.0 fL Final  . MCH 11/02/2016 27.1  25.1 - 34.0 pg Final  . MCHC 11/02/2016 32.3  31.5 - 36.0 g/dL Final  . RBC 11/02/2016 3.98  3.70 - 5.45 10e6/uL Final  . RDW 11/02/2016 18.4* 11.2 - 14.5 % Final  . lymph# 11/02/2016 0.9  0.9 - 3.3 10e3/uL Final  . MONO# 11/02/2016 0.2  0.1 - 0.9 10e3/uL Final  . Eosinophils Absolute 11/02/2016 0.0  0.0 - 0.5 10e3/uL Final  . Basophils Absolute 11/02/2016 0.0  0.0 - 0.1 10e3/uL Final  . NEUT% 11/02/2016 77.9* 38.4 - 76.8 % Final  . LYMPH% 11/02/2016 17.9  14.0 - 49.7 % Final  . MONO% 11/02/2016 3.4  0.0 - 14.0 % Final  . EOS% 11/02/2016 0.6  0.0 - 7.0 % Final  . BASO% 11/02/2016 0.2  0.0 - 2.0 % Final  . Sodium 11/02/2016 142  136 - 145 mEq/L Final  . Potassium 11/02/2016 3.4* 3.5 - 5.1 mEq/L Final  . Chloride 11/02/2016 103  98 - 109  mEq/L Final  . CO2 11/02/2016 26  22 - 29 mEq/L Final  . Glucose 11/02/2016 99  70 - 140 mg/dl Final  . BUN 11/02/2016 17.9  7.0 - 26.0 mg/dL Final  . Creatinine 11/02/2016 1.0  0.6 - 1.1 mg/dL Final  . Total Bilirubin 11/02/2016 3.95* 0.20 - 1.20 mg/dL Final  . Alkaline Phosphatase 11/02/2016 793* 40 - 150 U/L Final  . AST 11/02/2016 81* 5 - 34 U/L Final  . ALT 11/02/2016 68* 0 - 55 U/L Final  . Total Protein 11/02/2016 6.3* 6.4 - 8.3 g/dL Final  . Albumin 11/02/2016 2.3* 3.5 - 5.0 g/dL Final  . Calcium 11/02/2016  8.4  8.4 - 10.4 mg/dL Final  . Anion Gap 11/02/2016 12* 3 - 11 mEq/L Final  . EGFR 11/02/2016 70* >90 ml/min/1.73 m2 Final    RADIOGRAPHIC STUDIES: No results found.  ASSESSMENT/PLAN:    Nausea with vomiting Patient had no nausea or vomiting today.  She was able to eat a few crackers with peanut butter and also had some sips of Coke as well. __________________________________________  Update: Patient present back to the [place] [facility] today for a recheck.  She states she continues with some intermittent, chronic nausea and only occasional vomiting.  She takes Zofran as directed.  Hypokalemia Patient's potassium was down to 3.4 today.  Patient states that she does not like to take the potassium tablets.  She will instead try to push potassium in her diet.  We'll continue to monitor.  Essential hypertension Patient has a history of hypertension.  Patient's blood pressure on initial check today was up to 148/100.  Patient confirmed that she did not take blood pressure medication today; the plans to take it as soon as she returns home.  Patient was encouraged to take her blood pressure medication as directed; to also check her blood pressure when she gets back home.  She was encouraged to go directly to the emergency department for any worsening symptoms whatsoever.  Colon cancer [facility]) Patient received cycle 3 of her FOLFOX chemotherapy on April 13,018.  See further notes for  details of today's visit.  Patient is scheduled to return on 11/10/2016 for labs, flush, visit, and her next cycle of chemotherapy.  Altered level of consciousness Patient presented to the [place] today to have her chemotherapy pump discontinued.  Patient's son was with her today; reports that patient became increasingly confused overnight.  She was found on the floor in a confused state at some point as well.  Patient's son states the patient went to the emergency department on 10/15/2016 for the same symptoms.  Head CT obtained at that time was negative for any acute findings.  On exam today.  Patient initially appeared to be quite withdrawn.  She was able to only moderate incomprehensible sounds.  She was able to only follow very simple commands.   Labs obtained today revealed bilirubin has increased from 4.88 up to 5.4.  Liver enzymes were elevated and it was noted that patient's sclerae were jaundiced bilaterally.  Ammonia level has increased from 41 up to 51.  Patient was given additional IV fluid rehydration; and appear greatly improved from this provider came back for 2 weeks-examine the patient.  Patient had received approximate 1300 ML's normal saline IV fluid rehydration; and was found to be sitting up in the chair, fully awake.  She was laughing and talking with her son and the nurse.   reviewed all symptoms with Dr. Benay Spice; it is unclear exactly what may be causing these transient symptoms.  Patient will be allowed to go home this evening; but they were strongly encouraged to go directly to the emergency department for any worsening symptoms whatsoever.   ____________________________________________________________________________________  Update: Patient called and then presented back to the [facility] today.  The son reports that patient progressively worsened again last night; and eventually became unsteady and fell to the floor.  She hit the right side of her head and  now has a mild to moderate hematoma to the right forehead.  She denies any full loss of consciousness.  She denies any other neurological symptoms whatsoever.  Patient's son reports the  patient once again became very confused overnight.  He confirmed that patient had not been given any medications from the time she left the hospital until 2 in the morning when she requested a Zofran tablet.  This morning-patient appears slightly tired; but is otherwise alert, oriented, and able to follow all commands.  She denies any new symptoms whatsoever.  Dr. Benay Spice in to examine patient as well.  Differential diagnosis for patient's transient symptoms include encephalopathy secondary to elevated liver enzymes and increased ammonia level, dehydration, or medication related.  Carefully reviewed all of patient's medication was with both the patient and her son today.  Patient has been advised to hold any Compazine, narcotics, or anything else.  Medication wise that could cause patient's symptoms.  Also, will prescribe lactulose 20 g, which equals 15 mL twice daily for the next few days to see if this helps.  Patient was strongly encouraged to call/return or go directly to the emergency department over the weekend for any worsening symptoms whatsoever. ___________________________________________________________  Update: Patient presented back to the [place] today for recheck.  Both the patient and her son are very happy to report the patient had no further issues with confusion over the weekend.  Of note-patient did not pick up her start taking the lactulose as directed this past Friday.  Also, labs continue to improve when compared from last Friday for labs.  Bilirubin has improved from 5.4 down to 3.95.  Ammonia level has decreased from 51 down to 28.  Patient appears alert and very comfortable today.  She is oriented and communicating well with the staff.  She was able to drink fluids with no  difficulty.  The right for head hematoma is on was completely resolved; patient does have some mild healing bruising around her right eye.  We'll continue to monitor patient closely in regards to these complaints.   Patient stated understanding of all instructions; and was in agreement with this plan of care. The patient knows to call the clinic with any problems, questions or concerns.   Total time spent with patient was 43mnutes;  with greater than 75 percent of that time spent in face to face counseling regarding patient's symptoms,  and coordination of care and follow up.  Disclaimer:This dictation was prepared with Dragon/digital dictation along with [employer] Any transcriptional errors that result from this process are unintentional.  BDrue Second NP 11/03/2016

## 2016-11-03 NOTE — Assessment & Plan Note (Signed)
Patient received cycle 3 of her FOLFOX chemotherapy on April 13,018.  See further notes for details of today's visit.  Patient is scheduled to return on 11/10/2016 for labs, flush, visit, and her next cycle of chemotherapy.

## 2016-11-04 ENCOUNTER — Telehealth: Payer: Self-pay | Admitting: Oncology

## 2016-11-04 NOTE — Telephone Encounter (Signed)
Added appointments for 5/1. Left message and patient to get new schedule at next visit 4/17.

## 2016-11-08 ENCOUNTER — Other Ambulatory Visit: Payer: Self-pay | Admitting: Oncology

## 2016-11-10 ENCOUNTER — Telehealth: Payer: Self-pay | Admitting: *Deleted

## 2016-11-10 ENCOUNTER — Telehealth: Payer: Self-pay | Admitting: Nurse Practitioner

## 2016-11-10 ENCOUNTER — Ambulatory Visit (HOSPITAL_BASED_OUTPATIENT_CLINIC_OR_DEPARTMENT_OTHER): Payer: Medicare Other

## 2016-11-10 ENCOUNTER — Ambulatory Visit: Payer: Medicare Other

## 2016-11-10 ENCOUNTER — Ambulatory Visit (HOSPITAL_BASED_OUTPATIENT_CLINIC_OR_DEPARTMENT_OTHER): Payer: Medicare Other | Admitting: Nurse Practitioner

## 2016-11-10 ENCOUNTER — Other Ambulatory Visit (HOSPITAL_BASED_OUTPATIENT_CLINIC_OR_DEPARTMENT_OTHER): Payer: Medicare Other

## 2016-11-10 VITALS — BP 104/69 | HR 104 | Temp 97.8°F | Resp 17 | Ht 65.0 in

## 2016-11-10 VITALS — BP 105/84 | HR 103

## 2016-11-10 DIAGNOSIS — C7800 Secondary malignant neoplasm of unspecified lung: Secondary | ICD-10-CM

## 2016-11-10 DIAGNOSIS — I10 Essential (primary) hypertension: Secondary | ICD-10-CM | POA: Diagnosis not present

## 2016-11-10 DIAGNOSIS — C787 Secondary malignant neoplasm of liver and intrahepatic bile duct: Secondary | ICD-10-CM

## 2016-11-10 DIAGNOSIS — C799 Secondary malignant neoplasm of unspecified site: Secondary | ICD-10-CM

## 2016-11-10 DIAGNOSIS — R109 Unspecified abdominal pain: Secondary | ICD-10-CM

## 2016-11-10 DIAGNOSIS — R627 Adult failure to thrive: Secondary | ICD-10-CM

## 2016-11-10 DIAGNOSIS — R300 Dysuria: Secondary | ICD-10-CM

## 2016-11-10 DIAGNOSIS — Z95828 Presence of other vascular implants and grafts: Secondary | ICD-10-CM | POA: Insufficient documentation

## 2016-11-10 DIAGNOSIS — C189 Malignant neoplasm of colon, unspecified: Secondary | ICD-10-CM

## 2016-11-10 DIAGNOSIS — N189 Chronic kidney disease, unspecified: Secondary | ICD-10-CM

## 2016-11-10 DIAGNOSIS — D509 Iron deficiency anemia, unspecified: Secondary | ICD-10-CM

## 2016-11-10 LAB — CBC WITH DIFFERENTIAL/PLATELET
BASO%: 0.4 % (ref 0.0–2.0)
BASOS ABS: 0 10*3/uL (ref 0.0–0.1)
EOS ABS: 0 10*3/uL (ref 0.0–0.5)
EOS%: 0.3 % (ref 0.0–7.0)
HEMATOCRIT: 35.2 % (ref 34.8–46.6)
HEMOGLOBIN: 11.7 g/dL (ref 11.6–15.9)
LYMPH#: 0.8 10*3/uL — AB (ref 0.9–3.3)
LYMPH%: 15.7 % (ref 14.0–49.7)
MCH: 28.1 pg (ref 25.1–34.0)
MCHC: 33.2 g/dL (ref 31.5–36.0)
MCV: 84.6 fL (ref 79.5–101.0)
MONO#: 0.6 10*3/uL (ref 0.1–0.9)
MONO%: 12.5 % (ref 0.0–14.0)
NEUT#: 3.6 10*3/uL (ref 1.5–6.5)
NEUT%: 71.1 % (ref 38.4–76.8)
PLATELETS: 315 10*3/uL (ref 145–400)
RBC: 4.17 10*6/uL (ref 3.70–5.45)
RDW: 19.7 % — ABNORMAL HIGH (ref 11.2–14.5)
WBC: 5 10*3/uL (ref 3.9–10.3)

## 2016-11-10 LAB — URINALYSIS, MICROSCOPIC - CHCC
BILIRUBIN (URINE): NEGATIVE
BLOOD: NEGATIVE
COMMENTS:: <5
GLUCOSE UR CHCC: NEGATIVE mg/dL
Ketones: NEGATIVE mg/dL
LEUKOCYTE ESTERASE: NEGATIVE
NITRITE: POSITIVE
PH: 5 (ref 4.6–8.0)
PROTEIN: NEGATIVE mg/dL
Specific Gravity, Urine: 1.025 (ref 1.003–1.035)
Urobilinogen, UR: 0.2 mg/dL (ref 0.2–1)

## 2016-11-10 LAB — COMPREHENSIVE METABOLIC PANEL
ALBUMIN: 2.1 g/dL — AB (ref 3.5–5.0)
ALK PHOS: 457 U/L — AB (ref 40–150)
ALT: 30 U/L (ref 0–55)
AST: 42 U/L — ABNORMAL HIGH (ref 5–34)
Anion Gap: 13 mEq/L — ABNORMAL HIGH (ref 3–11)
BUN: 41 mg/dL — AB (ref 7.0–26.0)
CO2: 22 mEq/L (ref 22–29)
Calcium: 8.3 mg/dL — ABNORMAL LOW (ref 8.4–10.4)
Chloride: 105 mEq/L (ref 98–109)
Creatinine: 1.6 mg/dL — ABNORMAL HIGH (ref 0.6–1.1)
EGFR: 37 mL/min/{1.73_m2} — AB (ref >90)
Glucose: 91 mg/dl (ref 70–140)
POTASSIUM: 4.3 meq/L (ref 3.5–5.1)
Sodium: 140 mEq/L (ref 136–145)
Total Bilirubin: 2.02 mg/dL — ABNORMAL HIGH (ref 0.20–1.20)
Total Protein: 5.8 g/dL — ABNORMAL LOW (ref 6.4–8.3)

## 2016-11-10 MED ORDER — SODIUM CHLORIDE 0.9% FLUSH
10.0000 mL | INTRAVENOUS | Status: DC | PRN
  Administered 2016-11-10: 10 mL via INTRAVENOUS
  Filled 2016-11-10: qty 10

## 2016-11-10 MED ORDER — CIPROFLOXACIN HCL 500 MG PO TABS: 500.0000 mg | ORAL_TABLET | Freq: Two times a day (BID) | ORAL | 0 refills | Status: DC

## 2016-11-10 MED ORDER — HEPARIN SOD (PORK) LOCK FLUSH 100 UNIT/ML IV SOLN
500.0000 [IU] | Freq: Once | INTRAVENOUS | Status: AC | PRN
  Administered 2016-11-10: 500 [IU] via INTRAVENOUS
  Filled 2016-11-10: qty 5

## 2016-11-10 MED ORDER — SODIUM CHLORIDE 0.9 % IV SOLN
INTRAVENOUS | Status: DC
  Administered 2016-11-10: 10:00:00 via INTRAVENOUS

## 2016-11-10 NOTE — Telephone Encounter (Signed)
Call placed back to [facility] with [place] to give her an okay for order for home OT for patient per Dr. Benay Spice.

## 2016-11-10 NOTE — Patient Instructions (Signed)
Dehydration, Adult Dehydration is a condition in which there is not enough fluid or water in the body. This happens when you lose more fluids than you take in. Important organs, such as the kidneys, brain, and heart, cannot function without a proper amount of fluids. Any loss of fluids from the body can lead to dehydration. Dehydration can range from mild to severe. This condition should be treated right away to prevent it from becoming severe. What are the causes? This condition may be caused by:  Vomiting.  Diarrhea.  Excessive sweating, such as from heat exposure or exercise.  Not drinking enough fluid, especially:  When ill.  While doing activity that requires a lot of energy.  Excessive urination.  Fever.  Infection.  Certain medicines, such as medicines that cause the body to lose excess fluid (diuretics).  Inability to access safe drinking water.  Reduced physical ability to get adequate water and food. What increases the risk? This condition is more likely to develop in people:  Who have a poorly controlled long-term (chronic) illness, such as diabetes, heart disease, or kidney disease.  Who are age 65 or older.  Who are disabled.  Who live in a place with high altitude.  Who play endurance sports. What are the signs or symptoms? Symptoms of mild dehydration may include:   Thirst.  Dry lips.  Slightly dry mouth.  Dry, warm skin.  Dizziness. Symptoms of moderate dehydration may include:   Very dry mouth.  Muscle cramps.  Dark urine. Urine may be the color of tea.  Decreased urine production.  Decreased tear production.  Heartbeat that is irregular or faster than normal (palpitations).  Headache.  Light-headedness, especially when you stand up from a sitting position.  Fainting (syncope). Symptoms of severe dehydration may include:   Changes in skin, such as:  Cold and clammy skin.  Blotchy (mottled) or pale skin.  Skin that does  not quickly return to normal after being lightly pinched and released (poor skin turgor).  Changes in body fluids, such as:  Extreme thirst.  No tear production.  Inability to sweat when body temperature is high, such as in hot weather.  Very little urine production.  Changes in vital signs, such as:  Weak pulse.  Pulse that is more than 100 beats a minute when sitting still.  Rapid breathing.  Low blood pressure.  Other changes, such as:  Sunken eyes.  Cold hands and feet.  Confusion.  Lack of energy (lethargy).  Difficulty waking up from sleep.  Short-term weight loss.  Unconsciousness. How is this diagnosed? This condition is diagnosed based on your symptoms and a physical exam. Blood and urine tests may be done to help confirm the diagnosis. How is this treated? Treatment for this condition depends on the severity. Mild or moderate dehydration can often be treated at home. Treatment should be started right away. Do not wait until dehydration becomes severe. Severe dehydration is an emergency and it needs to be treated in a hospital. Treatment for mild dehydration may include:   Drinking more fluids.  Replacing salts and minerals in your blood (electrolytes) that you may have lost. Treatment for moderate dehydration may include:   Drinking an oral rehydration solution (ORS). This is a drink that helps you replace fluids and electrolytes (rehydrate). It can be found at pharmacies and retail stores. Treatment for severe dehydration may include:   Receiving fluids through an IV tube.  Receiving an electrolyte solution through a feeding tube that is   passed through your nose and into your stomach (nasogastric tube, or NG tube).  Correcting any abnormalities in electrolytes.  Treating the underlying cause of dehydration. Follow these instructions at home:  If directed by your health care provider, drink an ORS:  Make an ORS by following instructions on the  package.  Start by drinking small amounts, about  cup (120 mL) every 5-10 minutes.  Slowly increase how much you drink until you have taken the amount recommended by your health care provider.  Drink enough clear fluid to keep your urine clear or pale yellow. If you were told to drink an ORS, finish the ORS first, then start slowly drinking other clear fluids. Drink fluids such as:  Water. Do not drink only water. Doing that can lead to having too little salt (sodium) in the body (hyponatremia).  Ice chips.  Fruit juice that you have added water to (diluted fruit juice).  Low-calorie sports drinks.  Avoid:  Alcohol.  Drinks that contain a lot of sugar. These include high-calorie sports drinks, fruit juice that is not diluted, and soda.  Caffeine.  Foods that are greasy or contain a lot of fat or sugar.  Take over-the-counter and prescription medicines only as told by your health care provider.  Do not take sodium tablets. This can lead to having too much sodium in the body (hypernatremia).  Eat foods that contain a healthy balance of electrolytes, such as bananas, oranges, potatoes, tomatoes, and spinach.  Keep all follow-up visits as told by your health care provider. This is important. Contact a health care provider if:  You have abdominal pain that:  Gets worse.  Stays in one area (localizes).  You have a rash.  You have a stiff neck.  You are more irritable than usual.  You are sleepier or more difficult to wake up than usual.  You feel weak or dizzy.  You feel very thirsty.  You have urinated only a small amount of very dark urine over 6-8 hours. Get help right away if:  You have symptoms of severe dehydration.  You cannot drink fluids without vomiting.  Your symptoms get worse with treatment.  You have a fever.  You have a severe headache.  You have vomiting or diarrhea that:  Gets worse.  Does not go away.  You have blood or green matter  (bile) in your vomit.  You have blood in your stool. This may cause stool to look black and tarry.  You have not urinated in 6-8 hours.  You faint.  Your heart rate while sitting still is over 100 beats a minute.  You have trouble breathing. This information is not intended to replace advice given to you by your health care provider. Make sure you discuss any questions you have with your health care provider. Document Released: 07/13/2005 Document Revised: 02/07/2016 Document Reviewed: 09/06/2015 Elsevier Interactive Patient Education  2017 Elsevier Inc.  

## 2016-11-10 NOTE — Telephone Encounter (Signed)
Gave relative avs report and appointments for April. Per 4/17 los patient scheduled for lab/flush/fu/folfox 4/24 - no chemo today. Schedule adjusted accordingly.

## 2016-11-10 NOTE — Telephone Encounter (Signed)
Telephone call to son to advise lab results- Son verbalized an understanding and will pick up prescription from the pharmacy now. Son understands to call this office if patient has difficulty taking abx.

## 2016-11-10 NOTE — Progress Notes (Addendum)
[employer] OFFICE PROGRESS NOTE   Diagnosis:  Colon cancer  INTERVAL HISTORY:   Lisa Guerra returns as scheduled. She completed cycle 3 FOLFOX 10/27/2016. She was seen in the [facility] Management Clinic on 11/02/2016 with altered mental status. She improved with intravenous hydration.  Her son reports failure to thrive over the past 2 days. Oral intake is poor. She has been in bed most of the time over the past 48 hours. She is weak. Last bowel movement 4-5 days ago. No nausea or vomiting. The confusion is better. She has intermittent abdominal pain. She takes oxycodone as needed.  Objective:  Vital signs in last 24 hours:  Blood pressure 104/69, pulse (!) 104, temperature 97.8 F (36.6 C), temperature source Oral, resp. rate 17, height _0  (1.651 m), SpO2 100 %.    HEENT: Mouth appears dry. No thrush. Resp: Lungs clear bilaterally. Cardio: Regular rate and rhythm. GI: Abdomen is distended. Palpable liver edge. Vascular: No leg edema. Neuro: Alert and oriented.  Skin: Decreased skin turgor. Port-A-Cath without erythema.    Lab Results:  Lab Results  Component Value Date   WBC 5.0 11/10/2016   HGB 11.7 11/10/2016   HCT 35.2 11/10/2016   MCV 84.6 11/10/2016   PLT 315 11/10/2016   NEUTROABS 3.6 11/10/2016    Imaging:  No results found.  Medications: I have reviewed the patient's current medications.  Assessment/Plan: 1. Metastatic colon cancer-adenocarcinoma on biopsy of a cecal mass 09/07/2016  Separate polypoid mass at the sigmoid colon, biopsy to 15 2018 revealed a tubular adenoma with at least high-grade dysplasia  Staging CTs of the chest, abdomen, and pelvis 09/08/2016-extensive liver and lung metastases, bulky abdominal/retroperitoneal lymphadenopathy  Daryel Gerald elevated CEA  Ultrasound-guided biopsy of a liver lesion 09/25/2016 confirmed metastatic colon cancer, [facility] 1 testing- KRAS G13D positive, MSI-stable, intermediate tumor  burden, BRAF negative  Cycle 1 FOLFOX 09/29/2016  Cycle 2 FOLFOX 10/13/2016  Cycle 3 FOLFOX 10/27/2016-leucovorin added  2. Iron deficiency anemia secondary to #1  Red cell transfusion 09/24/2016   IV iron 09/27/2016  2 units of packed red blood cells 10/16/2016  3. Chronic renal insufficiency  4. Polyps noted on the colonoscopy 09/07/2016 including a polyp with high-grade dysplasia  5. Hypertension  6. Family history of multiple cancers including pancreas cancer  7. Lipidemia  8. Hyperbilirubinemia secondary to extensive metastatic disease involving the liver  CT abdomen 09/29/2016 with stable mild intrahepatic biliary dilatation.  Bilirubin improved 10/06/2016, 10/13/2016  9. Bilateral knee effusions left greater than right, erythema/tenderness overlying the right patella03/20/2018-improved  10. Admission with altered mental status 10/15/2016; altered mental status following cycle 3 FOLFOX, etiology unclear    Disposition: Lisa Guerra has completed 3 cycles of FOLFOX. Her performance status has declined significantly since her last visit 2 weeks ago. She appears dehydrated. We are holding today's treatment. She will receive a liter of normal saline. We will obtain a urinalysis and culture. She will return for a follow-up visit and possible chemotherapy in one week.  Patient seen with Dr. Benay Spice. 25 minutes were spent face-to-face at today's visit with the majority of that time involved in counseling/coordination of care.  Ned Card ANP/GNP-BC   11/10/2016  9:46 AM This was a shared visit with Ned Card. Lisa Guerra has experienced a decline in her performance status over the past few weeks. Her mental status has returned to baseline. The etiology of the altered mental status surrounding chemotherapy is unclear. We decided to hold chemotherapy today. We will  discontinue Decadron with the next cycle of chemotherapy. We will follow-up on  the CEA from today. She will return for an office visit prior to scheduled chemotherapy in one week. We discussed the plan with her son.  Julieanne Manson, M.D.

## 2016-11-10 NOTE — Telephone Encounter (Signed)
-----   Message from Owens Shark, NP sent at 11/10/2016  2:29 PM EDT ----- Please let her son know urinalysis was consistent with a urinary tract infection. This may be why she has not felt well for the past few days. I sent a prescription to her pharmacy for ciprofloxacin 500 mg twice daily for 3 days.

## 2016-11-10 NOTE — Telephone Encounter (Signed)
Message received from [facility] with [place] requesting order for home health OT for patient.  Will notify Dr. Benay Spice.

## 2016-11-12 LAB — URINE CULTURE

## 2016-11-15 ENCOUNTER — Emergency Department (HOSPITAL_COMMUNITY): Payer: Medicare Other

## 2016-11-15 ENCOUNTER — Observation Stay (HOSPITAL_COMMUNITY): Payer: Medicare Other

## 2016-11-15 ENCOUNTER — Observation Stay (HOSPITAL_COMMUNITY)
Admission: EM | Admit: 2016-11-15 | Discharge: 2016-11-17 | Disposition: A | Discharge status: Home / Self Care | Payer: Medicare Other | Attending: Family Medicine | Admitting: Family Medicine

## 2016-11-15 ENCOUNTER — Encounter (HOSPITAL_COMMUNITY): Payer: Self-pay | Admitting: Emergency Medicine

## 2016-11-15 DIAGNOSIS — C189 Malignant neoplasm of colon, unspecified: Principal | ICD-10-CM | POA: Insufficient documentation

## 2016-11-15 DIAGNOSIS — R159 Full incontinence of feces: Secondary | ICD-10-CM | POA: Diagnosis present

## 2016-11-15 DIAGNOSIS — M25461 Effusion, right knee: Secondary | ICD-10-CM | POA: Diagnosis not present

## 2016-11-15 DIAGNOSIS — E872 Acidosis, unspecified: Secondary | ICD-10-CM | POA: Diagnosis present

## 2016-11-15 DIAGNOSIS — E785 Hyperlipidemia, unspecified: Secondary | ICD-10-CM | POA: Insufficient documentation

## 2016-11-15 DIAGNOSIS — I10 Essential (primary) hypertension: Secondary | ICD-10-CM | POA: Diagnosis present

## 2016-11-15 DIAGNOSIS — C787 Secondary malignant neoplasm of liver and intrahepatic bile duct: Secondary | ICD-10-CM | POA: Diagnosis not present

## 2016-11-15 DIAGNOSIS — D638 Anemia in other chronic diseases classified elsewhere: Secondary | ICD-10-CM | POA: Diagnosis not present

## 2016-11-15 DIAGNOSIS — R531 Weakness: Secondary | ICD-10-CM | POA: Diagnosis not present

## 2016-11-15 DIAGNOSIS — Z800 Family history of malignant neoplasm of digestive organs: Secondary | ICD-10-CM | POA: Insufficient documentation

## 2016-11-15 DIAGNOSIS — R945 Abnormal results of liver function studies: Secondary | ICD-10-CM

## 2016-11-15 DIAGNOSIS — D509 Iron deficiency anemia, unspecified: Secondary | ICD-10-CM | POA: Insufficient documentation

## 2016-11-15 DIAGNOSIS — I700 Atherosclerosis of aorta: Secondary | ICD-10-CM | POA: Diagnosis not present

## 2016-11-15 DIAGNOSIS — N182 Chronic kidney disease, stage 2 (mild): Secondary | ICD-10-CM | POA: Diagnosis not present

## 2016-11-15 DIAGNOSIS — E43 Unspecified severe protein-calorie malnutrition: Secondary | ICD-10-CM | POA: Diagnosis not present

## 2016-11-15 DIAGNOSIS — R7989 Other specified abnormal findings of blood chemistry: Secondary | ICD-10-CM | POA: Diagnosis not present

## 2016-11-15 DIAGNOSIS — N390 Urinary tract infection, site not specified: Secondary | ICD-10-CM | POA: Diagnosis present

## 2016-11-15 DIAGNOSIS — I129 Hypertensive chronic kidney disease with stage 1 through stage 4 chronic kidney disease, or unspecified chronic kidney disease: Secondary | ICD-10-CM | POA: Insufficient documentation

## 2016-11-15 DIAGNOSIS — E860 Dehydration: Secondary | ICD-10-CM | POA: Diagnosis not present

## 2016-11-15 DIAGNOSIS — R627 Adult failure to thrive: Secondary | ICD-10-CM | POA: Insufficient documentation

## 2016-11-15 DIAGNOSIS — E876 Hypokalemia: Secondary | ICD-10-CM | POA: Diagnosis not present

## 2016-11-15 DIAGNOSIS — M25462 Effusion, left knee: Secondary | ICD-10-CM | POA: Diagnosis not present

## 2016-11-15 DIAGNOSIS — C7800 Secondary malignant neoplasm of unspecified lung: Secondary | ICD-10-CM | POA: Diagnosis not present

## 2016-11-15 DIAGNOSIS — Z87440 Personal history of urinary (tract) infections: Secondary | ICD-10-CM | POA: Insufficient documentation

## 2016-11-15 DIAGNOSIS — W19XXXA Unspecified fall, initial encounter: Secondary | ICD-10-CM | POA: Diagnosis not present

## 2016-11-15 DIAGNOSIS — S0990XA Unspecified injury of head, initial encounter: Secondary | ICD-10-CM | POA: Diagnosis not present

## 2016-11-15 LAB — URINALYSIS, ROUTINE W REFLEX MICROSCOPIC
Glucose, UA: NEGATIVE mg/dL
KETONES UR: NEGATIVE mg/dL
LEUKOCYTES UA: NEGATIVE
Nitrite: NEGATIVE
Protein, ur: 30 mg/dL — AB
Specific Gravity, Urine: 1.02 (ref 1.005–1.030)
pH: 5 (ref 5.0–8.0)

## 2016-11-15 LAB — CBC WITH DIFFERENTIAL/PLATELET
Basophils Absolute: 0 10*3/uL (ref 0.0–0.1)
Basophils Relative: 0 %
EOS PCT: 0 %
Eosinophils Absolute: 0 10*3/uL (ref 0.0–0.7)
HCT: 30.1 % — ABNORMAL LOW (ref 36.0–46.0)
HEMOGLOBIN: 10.3 g/dL — AB (ref 12.0–15.0)
LYMPHS ABS: 1.1 10*3/uL (ref 0.7–4.0)
LYMPHS PCT: 14 %
MCH: 27.8 pg (ref 26.0–34.0)
MCHC: 34.2 g/dL (ref 30.0–36.0)
MCV: 81.1 fL (ref 78.0–100.0)
Monocytes Absolute: 0.7 10*3/uL (ref 0.1–1.0)
Monocytes Relative: 9 %
NEUTROS PCT: 77 %
Neutro Abs: 6.2 10*3/uL (ref 1.7–7.7)
Platelets: 230 10*3/uL (ref 150–400)
RBC: 3.71 MIL/uL — AB (ref 3.87–5.11)
RDW: 18.9 % — ABNORMAL HIGH (ref 11.5–15.5)
WBC: 8.1 10*3/uL (ref 4.0–10.5)

## 2016-11-15 LAB — COMPREHENSIVE METABOLIC PANEL
ALK PHOS: 455 U/L — AB (ref 38–126)
ALT: 37 U/L (ref 14–54)
ANION GAP: 10 (ref 5–15)
AST: 58 U/L — ABNORMAL HIGH (ref 15–41)
Albumin: 2 g/dL — ABNORMAL LOW (ref 3.5–5.0)
BUN: 27 mg/dL — ABNORMAL HIGH (ref 6–20)
CALCIUM: 8.2 mg/dL — AB (ref 8.9–10.3)
CHLORIDE: 109 mmol/L (ref 101–111)
CO2: 20 mmol/L — AB (ref 22–32)
Creatinine, Ser: 1.01 mg/dL — ABNORMAL HIGH (ref 0.44–1.00)
GFR calc Af Amer: >60 mL/min (ref >60)
GFR calc non Af Amer: 56 mL/min — ABNORMAL LOW (ref >60)
Glucose, Bld: 103 mg/dL — ABNORMAL HIGH (ref 65–99)
POTASSIUM: 2.9 mmol/L — AB (ref 3.5–5.1)
Sodium: 139 mmol/L (ref 135–145)
Total Bilirubin: 6.9 mg/dL — ABNORMAL HIGH (ref 0.3–1.2)
Total Protein: 5.6 g/dL — ABNORMAL LOW (ref 6.5–8.1)

## 2016-11-15 LAB — I-STAT CG4 LACTIC ACID, ED: Lactic Acid, Venous: 2.33 mmol/L (ref 0.5–1.9)

## 2016-11-15 LAB — AMMONIA: AMMONIA: 74 umol/L — AB (ref 9–35)

## 2016-11-15 LAB — PROTIME-INR
INR: 1.23
Prothrombin Time: 15.6 seconds — ABNORMAL HIGH (ref 11.4–15.2)

## 2016-11-15 LAB — PHOSPHORUS: Phosphorus: 1.9 mg/dL — ABNORMAL LOW (ref 2.5–4.6)

## 2016-11-15 LAB — LACTIC ACID, PLASMA: LACTIC ACID, VENOUS: 3.8 mmol/L — AB (ref 0.5–1.9)

## 2016-11-15 MED ORDER — SODIUM CHLORIDE 0.9 % IV SOLN: 30.0000 meq | Freq: Once | INTRAVENOUS | Status: DC

## 2016-11-15 MED ORDER — OXYCODONE HCL 5 MG PO TABS: 5.0000 mg | ORAL_TABLET | ORAL | Status: DC | PRN

## 2016-11-15 MED ORDER — LACTULOSE 10 GM/15ML PO SOLN
20.0000 g | Freq: Two times a day (BID) | ORAL | Status: DC
  Administered 2016-11-16 – 2016-11-17 (×2): 20 g via ORAL
  Filled 2016-11-15 (×4): qty 30

## 2016-11-15 MED ORDER — SODIUM CHLORIDE 0.9% FLUSH: 3.0000 mL | Freq: Two times a day (BID) | INTRAVENOUS | Status: DC

## 2016-11-15 MED ORDER — ENSURE ENLIVE PO LIQD: 237.0000 mL | Freq: Two times a day (BID) | ORAL | Status: DC

## 2016-11-15 MED ORDER — SODIUM CHLORIDE 0.9 % IV SOLN
INTRAVENOUS | Status: AC
  Administered 2016-11-16 (×2): via INTRAVENOUS

## 2016-11-15 MED ORDER — POTASSIUM CHLORIDE CRYS ER 10 MEQ PO TBCR
30.0000 meq | EXTENDED_RELEASE_TABLET | Freq: Once | ORAL | Status: AC
  Administered 2016-11-15: 21:00:00 30 meq via ORAL
  Filled 2016-11-15: qty 1

## 2016-11-15 MED ORDER — K PHOS MONO-SOD PHOS DI & MONO 155-852-130 MG PO TABS
500.0000 mg | ORAL_TABLET | Freq: Two times a day (BID) | ORAL | Status: DC
  Administered 2016-11-16 – 2016-11-17 (×4): 500 mg via ORAL
  Filled 2016-11-15 (×4): qty 2

## 2016-11-15 MED ORDER — DEXTROSE 5 % IV SOLN
1.0000 g | Freq: Once | INTRAVENOUS | Status: AC
  Administered 2016-11-15: 1 g via INTRAVENOUS
  Filled 2016-11-15: qty 10

## 2016-11-15 MED ORDER — MAGNESIUM SULFATE 2 GM/50ML IV SOLN
2.0000 g | Freq: Once | INTRAVENOUS | Status: AC
  Administered 2016-11-15: 2 g via INTRAVENOUS
  Filled 2016-11-15: qty 50

## 2016-11-15 MED ORDER — ONDANSETRON HCL 4 MG PO TABS: 4.0000 mg | ORAL_TABLET | Freq: Four times a day (QID) | ORAL | Status: DC | PRN

## 2016-11-15 MED ORDER — SODIUM CHLORIDE 0.9 % IV BOLUS (SEPSIS)
1000.0000 mL | Freq: Once | INTRAVENOUS | Status: AC
  Administered 2016-11-15: 1000 mL via INTRAVENOUS

## 2016-11-15 MED ORDER — VITAMIN B-1 100 MG PO TABS
100.0000 mg | ORAL_TABLET | Freq: Every day | ORAL | Status: DC
  Administered 2016-11-16 – 2016-11-17 (×2): 100 mg via ORAL
  Filled 2016-11-15 (×2): qty 1

## 2016-11-15 MED ORDER — ONDANSETRON HCL 4 MG/2ML IJ SOLN: 4.0000 mg | Freq: Four times a day (QID) | INTRAMUSCULAR | Status: DC | PRN

## 2016-11-15 MED ORDER — POTASSIUM CHLORIDE 10 MEQ/100ML IV SOLN
10.0000 meq | INTRAVENOUS | Status: AC
  Administered 2016-11-15 (×3): 10 meq via INTRAVENOUS
  Filled 2016-11-15 (×3): qty 100

## 2016-11-15 NOTE — ED Provider Notes (Signed)
[facility] DEPT Provider Note   CSN: [id number] Arrival date & time: 11/15/16  1512     History   Chief Complaint Chief Complaint  Patient presents with  . Encopresis  . Weakness    HPI Lisa Guerra is a 71 y.o. female.  71yo F w/ colon CA on chemo, HTN, HLD who p/w encopresis and weakness. PT has been feeling weak for the past 1 week. She normally ambulates w/ a walker but recently has had difficulty getting out of bed which is abnormal for her. She has had several episodes of bowel incontinence over the past few days where she tried to get to the bathroom but didn't make it in time. 1 week ago she had constipation and was started on a laxative which has improved the constipation. No urinary symptoms, N/V, fever, cough/cold sx, rash, chest pain, or SOB. She reports good appetite and PO intake.  Of note, she was diagnosed with a UTI several days ago and given a three-day course of ciprofloxacin which she completed.   The history is provided by the patient.  Weakness     Past Medical History:  Diagnosis Date  . Anemia   . Colon cancer ([place])   . High cholesterol   . Hypertension   . Low hemoglobin     Patient Active Problem List   Diagnosis Date Noted  . Port catheter in place 11/10/2016  . Hypokalemia 11/03/2016  . Altered level of consciousness 10/30/2016  . Nausea with vomiting 10/30/2016  . Acute encephalopathy 10/16/2016  . UTI (urinary tract infection): Probable 10/16/2016  . Goals of care, counseling/discussion 09/28/2016  . Iron deficiency anemia due to chronic blood loss 09/27/2016  . Protein-calorie malnutrition, severe 09/25/2016  . Anemia of chronic disease 09/24/2016  . Elevated LFTs 09/21/2016  . Chest pain 09/20/2016  . Metastatic cancer ([place]) 09/20/2016  . Anemia   . Essential hypertension   . Colon cancer ([place]) 09/14/2016    Past Surgical History:  Procedure Laterality Date  . CESAREAN SECTION     x2   . COLONOSCOPY N/A 09/07/2016     Dr. Wynetta Emery with [name]: two 5 mm polyps in descending colon, large tumor consistent with adenocarcinoma filled cecum/ascending colon, large multi-lobulated ulcerated polypoid lesion at recto-sigmoid junction 20 cm from anal verge.   . IR GENERIC HISTORICAL  09/24/2016   IR FLUORO GUIDE PORT INSERTION RIGHT 09/24/2016 Arne Cleveland, MD [facility]  . IR GENERIC HISTORICAL  09/24/2016   IR US GUIDE VASC ACCESS RIGHT 09/24/2016 Arne Cleveland, MD [facility]  . IR GENERIC HISTORICAL  09/24/2016   IR US GUIDE BX ASP/DRAIN 09/24/2016 Arne Cleveland, MD [facility]  . TONSILLECTOMY    . TUBAL LIGATION      OB History    No data available       Home Medications    Prior to Admission medications   Medication Sig Start Date End Date Taking? Authorizing Provider  amLODipine (NORVASC) 5 MG tablet Take 5 mg by mouth daily.   Yes Historical Provider, MD  feeding supplement, ENSURE COMPLETE, (ENSURE COMPLETE) LIQD Take 237 mLs by mouth 2 (two) times daily between meals. 10/17/16  Yes Eugenie Filler, MD  lidocaine-prilocaine (EMLA) cream Apply 1 application topically as needed. Apply to portacath 1-2 hours prior to use 10/06/16  Yes Owens Shark, NP  ondansetron (ZOFRAN) 8 MG tablet Take 1 tablet (8 mg total) by mouth every 8 (eight) hours as needed for nausea or vomiting.  10/29/16  Yes Ladell Pier, MD  oxyCODONE (OXY IR/ROXICODONE) 5 MG immediate release tablet Take 1 tablet (5 mg total) by mouth every 4 (four) hours as needed for moderate pain. 10/27/16  Yes Ladell Pier, MD  ciprofloxacin (CIPRO) 500 MG tablet Take 1 tablet (500 mg total) by mouth 2 (two) times daily. Take for 3 days Patient not taking: Reported on 11/15/2016 11/10/16   Owens Shark, NP  ferrous sulfate 325 (65 FE) MG tablet Take 1 tablet (325 mg total) by mouth 3 (three) times daily with meals. Patient not taking: Reported on 11/10/2016 09/14/16   Ladell Pier, MD  lactulose (CHRONULAC) 10 GM/15ML solution Take 30 mLs (20 g  total) by mouth 2 (two) times daily. Patient not taking: Reported on 11/02/2016 10/30/16   Susanne Borders, NP  senna-docusate (SENOKOT-S) 8.6-50 MG tablet Take 1 tablet by mouth 2 (two) times daily. Patient not taking: Reported on 11/15/2016 10/17/16   Eugenie Filler, MD    Family History Family History  Problem Relation Age of Onset  . Pancreatic cancer Mother   . Leukemia Father   . Colon cancer Brother     Social History Social History  Substance Use Topics  . Smoking status: Never Smoker  . Smokeless tobacco: Never Used  . Alcohol use No     Allergies   Patient has no known allergies.   Review of Systems Review of Systems  Neurological: Positive for weakness.  All other systems reviewed and are negative.    Physical Exam Updated Vital Signs BP 117/82 (BP Location: Left Arm)   Pulse 83   Temp 97.6 F (36.4 C) (Oral)   Resp 18   SpO2 99%   Physical Exam  Constitutional: She is oriented to person, place, and time. No distress.  Frail, elderly woman awake and comfortable  HENT:  Head: Normocephalic and atraumatic.  Moist mucous membranes  Eyes: Conjunctivae are normal. Pupils are equal, round, and reactive to light. Scleral icterus is present.  Neck: Neck supple.  Cardiovascular: Normal rate, regular rhythm and normal heart sounds.   No murmur heard. Pulmonary/Chest: Effort normal and breath sounds normal.  Abdominal: Soft. Bowel sounds are normal. She exhibits distension. There is no tenderness.  Musculoskeletal: She exhibits no edema.  Neurological: She is alert and oriented to person, place, and time.  Fluent speech  Skin: Skin is warm and dry.  jaundiced  Psychiatric: She has a normal mood and affect. Judgment normal.  Nursing note and vitals reviewed.    ED Treatments / Results  Labs (all labs ordered are listed, but only abnormal results are displayed) Labs Reviewed  URINE CULTURE  COMPREHENSIVE METABOLIC PANEL  CBC WITH DIFFERENTIAL/PLATELET   URINALYSIS, ROUTINE W REFLEX MICROSCOPIC  I-STAT CG4 LACTIC ACID, ED    EKG  EKG Interpretation None       Radiology No results found.  Procedures Procedures (including critical care time)  Medications Ordered in ED Medications - No data to display   Initial Impression / Assessment and Plan / ED Course  I have reviewed the triage vital signs and the nursing notes.  Pertinent labs & imaging results that were available during my care of the patient were reviewed by me and considered in my medical decision making (see chart for details).     PT w/ known Metastatic cancer including liver metastases presents with worsening weakness over the past one week. Exam, she was frail appearing but comfortable with reassuring vital signs,  afebrile. She had no focal abdominal tenderness, no complaints of chest pain, clear breath sounds. She was jaundiced with scleral icterus. Obtained above labs which were notable for initial lactate of 2.3, potassium 2.9, BUN 27, creatinine 1.0, bilirubin 6.9 which is significantly increased from a few days ago, WBC 8.1,  hemoglobin 10.3, UA without evidence of infection but I did order urine culture. Gave a dose of ceftriaxone as her urine culture from a few days ago was positive for Escherichia coli and her symptoms could represent a partially treated UTI. Chest x-ray negative acute. Gave the patient IV potassium repletion as well as magnesium, oral potassium, and IV fluid bolus. Because of her worsening weakness associated with rising bilirubin which is likely secondary to her liver metastases, discussed admission with hospitalist, Dr. Roel Cluck, and pt admitted for further care.  Final Clinical Impressions(s) / ED Diagnoses   Final diagnoses:  Weakness  Hypokalemia  Hyperbilirubinemia  Liver metastases [facility])    New Prescriptions New Prescriptions   No medications on file     Sharlett Iles, MD 11/16/16 [phone]

## 2016-11-15 NOTE — ED Notes (Signed)
Pt has a port-a cath, and prefers for blood work to be drawn from there.

## 2016-11-15 NOTE — ED Triage Notes (Addendum)
Patient started having bowel incontinence starting yesterday.  Family reports that she has gotten a lot weaker in the past 3 days.  Patient is eating and drinking.  Patient currently getting chemo for colon cancer. Last treatment was on 4/17.

## 2016-11-15 NOTE — ED Notes (Signed)
Abnormal lab result MD Little and RN tim have been made aware

## 2016-11-15 NOTE — H&P (Addendum)
Lisa Guerra:[id number] DOB: Mar 07, 1946 DOA: 11/15/2016     PCP: [name], MD   Outpatient Specialists: [facility] Patient coming from:   home Lives  With family    Chief Complaint: fatigue  HPI: Lisa Guerra is a 71 y.o. 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 And states she continues to eat and drink but had progressive difficulty getting up from the bed. He denies any fevers or chills no chest pain or shortness of breath no current nausea vomiting but had some episodes after chemo. Bowel movements have become more frequent after administration of lactulose  given history of metastatic disease to the liver with elevated ammonia but patient stopped taking lactulose and states no confusion.   Patient has had recent admissions for altered mental status from 22nd to 24th of March. Patient have had recurrent episodes of confusion but improvement has been rising with rising liver enzymes secondary to metastases to the liver Level has been going up she had had IV infusions for dehydration at [facility] on April 5 Patient received cycle 3 of her FOLFOX chemotherapy on April 13,018 so thereafter she became confused and had a fall hitting her head. She was started on lactulose. Her performance status is progressively declined she have had numerous visits to oncologist for dehydration the last dose of chemotherapy was held on 17th of April he was diagnosed with UTI and treated to 3 days of Cipro. She reports she had no dysuria prior to that.  Regarding pertinent Chronic problems: Metastatic colon cancer currently undergoing chemotherapy cycle 2 FOLFOX 10/13/2016 Colon cancer originally diagnosed in every 2018 her course has been  complicated by iron deficiency anemia requiring transfusion in 23rd of March  IN ER:  Temp (24hrs), Avg:97.6 F (36.4 C), Min:97.6 F (36.4 C), Max:97.6 F (36.4 C)      RR 12 98% Hr 94 P 122/93 LA 2.33 Na 139 K 2.9  BUN 27 Cr 1.01 at baseline 0.8 alb 2.0 WBC 8.1 Hg 10.3 plt 230 CXR non acute  Following Medications were ordered in ER: Medications  magnesium sulfate IVPB 2 g 50 mL (2 g Intravenous New Bag/Given 11/15/16 1929)  potassium chloride (K-DUR,KLOR-CON) CR tablet 30 mEq (not administered)  potassium chloride 10 mEq in 100 mL IVPB (10 mEq Intravenous New Bag/Given 11/15/16 1928)  sodium chloride 0.9 % bolus 1,000 mL (0 mLs Intravenous Stopped 11/15/16 1841)  cefTRIAXone (ROCEPHIN) 1 g in dextrose 5 % 50 mL IVPB (1 g Intravenous New Bag/Given 11/15/16 1842)      Hospitalist was called for admission for Dehydration, debility  Review of Systems:    Pertinent positives include: fatigue, diarrhea,  Constitutional:  No weight loss, night sweats, Fevers, chills,  weight loss  HEENT:  No headaches, Difficulty swallowing,Tooth/dental problems,Sore throat,  No sneezing, itching, ear ache, nasal congestion, post nasal drip,  Cardio-vascular:  No chest pain, Orthopnea, PND, anasarca, dizziness, palpitations.no Bilateral lower extremity swelling  GI:  No heartburn, indigestion, abdominal pain, nausea, vomiting,  change in bowel habits, loss of appetite, melena, blood in stool, hematemesis Resp:  no shortness of breath at rest. No dyspnea on exertion, No excess mucus, no productive cough, No non-productive cough, No coughing up of blood.No change in color of  mucus.No wheezing. Skin:  no rash or lesions. No jaundice GU:  no dysuria, change in color of urine, no urgency or frequency. No straining to urinate.  No flank pain.  Musculoskeletal:  No joint pain or no joint swelling. No decreased range of motion. No back pain.  Psych:  No change in mood or affect. No depression or  anxiety. No memory loss.  Neuro: no localizing neurological complaints, no tingling, no weakness, no double vision, no gait abnormality, no slurred speech, no confusion  As per HPI otherwise 10 point review of systems negative.   Past Medical History: Past Medical History:  Diagnosis Date  . Anemia   . Colon cancer ([place])   . High cholesterol   . Hypertension   . Low hemoglobin    Past Surgical History:  Procedure Laterality Date  . CESAREAN SECTION     x2   . COLONOSCOPY N/A 09/07/2016   Dr. Wynetta Emery with [name]: two 5 mm polyps in descending colon, large tumor consistent with adenocarcinoma filled cecum/ascending colon, large multi-lobulated ulcerated polypoid lesion at recto-sigmoid junction 20 cm from anal verge.   . IR GENERIC HISTORICAL  09/24/2016   IR FLUORO GUIDE PORT INSERTION RIGHT 09/24/2016 Arne Cleveland, MD [facility]  . IR GENERIC HISTORICAL  09/24/2016   IR US GUIDE VASC ACCESS RIGHT 09/24/2016 Arne Cleveland, MD [facility]  . IR GENERIC HISTORICAL  09/24/2016   IR US GUIDE BX ASP/DRAIN 09/24/2016 Arne Cleveland, MD [facility]  . TONSILLECTOMY    . TUBAL LIGATION       Social History:  Ambulatory walker now needs asistance   reports that she has never smoked. She has never used smokeless tobacco. She reports that she does not drink alcohol or use drugs.  Allergies:  No Known Allergies     Family History:   Family History  Problem Relation Age of Onset  . Pancreatic cancer Mother   . Leukemia Father   . Colon cancer Brother     Medications: Prior to Admission medications   Medication Sig Start Date End Date Taking? Authorizing Provider  amLODipine (NORVASC) 5 MG tablet Take 5 mg by mouth daily.   Yes Historical Provider, MD  feeding supplement, ENSURE COMPLETE, (ENSURE COMPLETE) LIQD Take 237 mLs by mouth 2 (two) times daily between meals. 10/17/16  Yes Eugenie Filler, MD  lidocaine-prilocaine (EMLA) cream Apply 1 application topically as needed.  Apply to portacath 1-2 hours prior to use 10/06/16  Yes Owens Shark, NP  ondansetron (ZOFRAN) 8 MG tablet Take 1 tablet (8 mg total) by mouth every 8 (eight) hours as needed for nausea or vomiting. 10/29/16  Yes Ladell Pier, MD  oxyCODONE (OXY IR/ROXICODONE) 5 MG immediate release tablet Take 1 tablet (5 mg total) by mouth every 4 (four) hours as needed for moderate pain. 10/27/16  Yes Ladell Pier, MD  ciprofloxacin (CIPRO) 500 MG tablet Take 1 tablet (500 mg total) by mouth 2 (two) times daily. Take for 3 days Patient not taking: Reported on 11/15/2016 11/10/16   Owens Shark, NP  ferrous sulfate 325 (65 FE) MG tablet Take 1 tablet (325 mg total) by mouth 3 (three) times daily with meals. Patient not taking: Reported on 11/10/2016 09/14/16   Ladell Pier, MD  lactulose (CHRONULAC) 10 GM/15ML solution Take 30 mLs (20 g total) by mouth 2 (two) times daily. Patient not taking: Reported on 11/02/2016 10/30/16   Susanne Borders, NP  senna-docusate (SENOKOT-S) 8.6-50  MG tablet Take 1 tablet by mouth 2 (two) times daily. Patient not taking: Reported on 11/15/2016 10/17/16   Eugenie Filler, MD    Physical Exam: Patient Vitals for the past 24 hrs:  BP Temp Temp src Pulse Resp SpO2  11/15/16 1848 (!) 122/93 - - 94 12 98 %  11/15/16 1742 116/87 - - 88 (!) 22 100 %  11/15/16 1711 119/83 - - 82 15 100 %  11/15/16 1636 117/82 - - 83 18 99 %  11/15/16 1517 121/87 97.6 F (36.4 C) Oral 95 18 99 %    1. General:  in No Acute distress 2. Psychological: Alert and Oriented 3. Head/ENT:    Dry Mucous Membranes                          Head Non traumatic, neck supple                            Poor Dentition 4. SKIN: decreased Skin turgor,  Skin clean Dry and intact no rash 5. Heart: Regular rate and rhythm no  Murmur, Rub or gallop 6. Lungs:  no wheezes or crackles   7. Abdomen: Soft,  non-tender, Non distended 8. Lower extremities: no clubbing, cyanosis, or edema 9. Neurologically Grossly intact,  moving all 4 extremities equally   10. MSK: Normal range of motion   body mass index is unknown because there is no height or weight on file.  Labs on Admission:   Labs on Admission: I have personally reviewed following labs and imaging studies  CBC:  Recent Labs Lab 11/10/16 0851 11/15/16 1647  WBC 5.0 8.1  NEUTROABS 3.6 6.2  HGB 11.7 10.3*  HCT 35.2 30.1*  MCV 84.6 81.1  PLT 315 073   Basic Metabolic Panel:  Recent Labs Lab 11/10/16 0851 11/15/16 1647  NA 140 139  K 4.3 2.9*  CL  --  109  CO2 22 20*  GLUCOSE 91 103*  BUN 41.0* 27*  CREATININE 1.6* 1.01*  CALCIUM 8.3* 8.2*   GFR: Estimated Creatinine Clearance: 36.3 mL/min (A) (by C-G formula based on SCr of 1.01 mg/dL (H)). Liver Function Tests:  Recent Labs Lab 11/10/16 0851 11/15/16 1647  AST 42* 58*  ALT 30 37  ALKPHOS 457* 455*  BILITOT 2.02* 6.9*  PROT 5.8* 5.6*  ALBUMIN 2.1* 2.0*   No results for input(s): LIPASE, AMYLASE in the last 168 hours. No results for input(s): AMMONIA in the last 168 hours. Coagulation Profile: No results for input(s): INR, PROTIME in the last 168 hours. Cardiac Enzymes: No results for input(s): CKTOTAL, CKMB, CKMBINDEX, TROPONINI in the last 168 hours. BNP (last 3 results) No results for input(s): PROBNP in the last 8760 hours. HbA1C: No results for input(s): HGBA1C in the last 72 hours. CBG: No results for input(s): GLUCAP in the last 168 hours. Lipid Profile: No results for input(s): CHOL, HDL, LDLCALC, TRIG, CHOLHDL, LDLDIRECT in the last 72 hours. Thyroid Function Tests: No results for input(s): TSH, T4TOTAL, FREET4, T3FREE, THYROIDAB in the last 72 hours. Anemia Panel: No results for input(s): VITAMINB12, FOLATE, FERRITIN, TIBC, IRON, RETICCTPCT in the last 72 hours. Urine analysis:    Component Value Date/Time   COLORURINE AMBER (A) 11/15/2016 1603   APPEARANCEUR CLEAR 11/15/2016 1603   LABSPEC 1.020 11/15/2016 1603   LABSPEC 1.025 11/10/2016 1010    PHURINE 5.0 11/15/2016 1603   GLUCOSEU NEGATIVE 11/15/2016  1603   GLUCOSEU Negative 11/10/2016 1010   HGBUR SMALL (A) 11/15/2016 1603   BILIRUBINUR MODERATE (A) 11/15/2016 1603   BILIRUBINUR Negative 11/10/2016 1010   KETONESUR NEGATIVE 11/15/2016 1603   PROTEINUR 30 (A) 11/15/2016 1603   UROBILINOGEN 0.2 11/10/2016 1010   NITRITE NEGATIVE 11/15/2016 1603   LEUKOCYTESUR NEGATIVE 11/15/2016 1603   LEUKOCYTESUR Negative 11/10/2016 1010   Sepsis Labs: @LABRCNTIP (procalcitonin:4,lacticidven:4) ) Recent Results (from the past 240 hour(s))  Urine Culture     Status: Abnormal   Collection Time: 11/10/16 10:10 AM  Result Value Ref Range Status   Urine Culture, Routine Final report (A)  Final   Urine Culture result 1 Escherichia coli (A)  Final    Comment: Greater than 100,000 colony forming units per mL Cefazolin <=4 ug/mL Cefazolin with an MIC <=16 predicts susceptibility to the oral agents cefaclor, cefdinir, cefpodoxime, cefprozil, cefuroxime, cephalexin, and loracarbef when used for therapy of uncomplicated urinary tract infections due to E. coli, Klebsiella pneumoniae, and Proteus mirabilis.    ANTIMICROBIAL SUSCEPTIBILITY Comment  Final    Comment:       ** S = Susceptible; I = Intermediate; R = Resistant **                    P = Positive; N = Negative             MICS are expressed in micrograms per mL    Antibiotic                 RSLT#1    RSLT#2    RSLT#3    RSLT#4 Amoxicillin/Clavulanic Acid    S Ampicillin                     R Cefepime                       S Ceftriaxone                    S Cefuroxime                     S Cephalothin                    S Ciprofloxacin                  S Ertapenem                      S Gentamicin                     S Imipenem                       S Levofloxacin                   S Nitrofurantoin                 S Piperacillin                   R Tetracycline                   S Tobramycin                      S Trimethoprim/Sulfa             S  UA WBC 6-30 rare bacteria doubt ongoing UTI  No results found for: HGBA1C  Estimated Creatinine Clearance: 36.3 mL/min (A) (by C-G formula based on SCr of 1.01 mg/dL (H)).  BNP (last 3 results) No results for input(s): PROBNP in the last 8760 hours.   ECG REPORT ordered  There were no vitals filed for this visit.   Cultures:    Component Value Date/Time   SDES URINE, RANDOM 10/16/2016 1645   SPECREQUEST NONE 10/16/2016 1645   CULT  10/16/2016 1645    NO GROWTH Performed at [facility], [place] [address], [place], [place] [address]    REPTSTATUS 10/17/2016 FINAL 10/16/2016 1645     Radiological Exams on Admission: Dg Chest 2 View  Result Date: 11/15/2016 CLINICAL DATA:  Weakness and diarrhea x1 week. History of metastatic colon cancer. EXAM: CHEST  2 VIEW COMPARISON:  10/15/2016 radiographs FINDINGS: The heart size and mediastinal contours are within normal limits. Aortic atherosclerosis at the arch without aneurysmal dilatation. Port catheter tip is seen in the proximal to mid SVC. Low lung volumes again noted without pneumonic consolidation nor overt pulmonary edema. Trace pleural effusions noted bilaterally blunting the costophrenic angles. Small calcified granuloma at the right lung base. The visualized skeletal structures are unremarkable. IMPRESSION: Low lung volumes are again noted without acute pneumonic consolidation nor CHF. Trace bilateral pleural effusions. Electronically Signed   By: Ashley Royalty M.D.   On: 11/15/2016 18:40    Chart has been reviewed    Assessment/Plan  71 y.o. female with medical history significant of metastatic colon cancer with metastases to the liver iron deficiency anemia, CKD, HTN hyperlipidemia admitted with debility and dehydration and lactic acidosis     Present on Admission: Dehydration - administer IV fluids check orthostatics . Essential hypertension Hold Norvasc given soft  blood pressures and dehydration . Hypokalemia - Replace check magnesium and phosphate levels and replace as needed, monitor on telemetry . UTI (urinary tract infection): Probable - looks like improved on UA patient have received 3 days of ciprofloxacin urine culture showed pansensitive Escherichia coli currently afebrile patient hadn't had any symptoms consistent with dysuria we'll continue to monitor . Protein-calorie malnutrition, severe - nutritional consult obtain prealbumin in a.m. . Anemia of chronic disease - Stable continue to monitor . Colon cancer [facility])- discussed with oncology will appreciated discussion patient goals of care and overall prognosis . Elevated LFTsSecondary to metastatic disease  . Lactic acidosisLikely secondary to liver pathology patient unable to clear lactic acid Head injury with recent fall will obtain CT of the head  Other plan as per orders.  DVT prophylaxis:  SCD     Code Status:  FULL CODE as per patient    Family Communication:   Family   at  Bedside  plan of care was discussed with  Son,   Disposition Plan:     likely will need placement for rehabilitation pending PT OT eval                                                Would benefit from PT/OT eval prior to DC  ordered                       Nutrition     consulted  Consults called: Notify Oncology through [employer] to Dr. Jana Hakim  Admission status: obs   Level of care     tele             I have spent a total of 36min on this admission   extra time was spent to discuss case with consultant  [place] 11/15/2016, 10:50 PM    [facility]  Pager [phone]   after 2 AM please page floor coverage PA If 7AM-7PM, please contact the day team taking care of the patient  [email or web address]  Password [email or web address]

## 2016-11-16 ENCOUNTER — Observation Stay (HOSPITAL_COMMUNITY): Payer: Medicare Other

## 2016-11-16 DIAGNOSIS — C787 Secondary malignant neoplasm of liver and intrahepatic bile duct: Secondary | ICD-10-CM | POA: Diagnosis not present

## 2016-11-16 DIAGNOSIS — R627 Adult failure to thrive: Secondary | ICD-10-CM | POA: Diagnosis not present

## 2016-11-16 DIAGNOSIS — N189 Chronic kidney disease, unspecified: Secondary | ICD-10-CM | POA: Diagnosis not present

## 2016-11-16 DIAGNOSIS — R7989 Other specified abnormal findings of blood chemistry: Secondary | ICD-10-CM

## 2016-11-16 DIAGNOSIS — C189 Malignant neoplasm of colon, unspecified: Secondary | ICD-10-CM

## 2016-11-16 DIAGNOSIS — D630 Anemia in neoplastic disease: Secondary | ICD-10-CM | POA: Diagnosis not present

## 2016-11-16 DIAGNOSIS — R109 Unspecified abdominal pain: Secondary | ICD-10-CM | POA: Diagnosis not present

## 2016-11-16 DIAGNOSIS — E43 Unspecified severe protein-calorie malnutrition: Secondary | ICD-10-CM | POA: Diagnosis not present

## 2016-11-16 DIAGNOSIS — D638 Anemia in other chronic diseases classified elsewhere: Secondary | ICD-10-CM

## 2016-11-16 DIAGNOSIS — R5383 Other fatigue: Secondary | ICD-10-CM | POA: Diagnosis not present

## 2016-11-16 DIAGNOSIS — I10 Essential (primary) hypertension: Secondary | ICD-10-CM | POA: Diagnosis not present

## 2016-11-16 DIAGNOSIS — J90 Pleural effusion, not elsewhere classified: Secondary | ICD-10-CM | POA: Diagnosis not present

## 2016-11-16 DIAGNOSIS — C180 Malignant neoplasm of cecum: Secondary | ICD-10-CM | POA: Diagnosis not present

## 2016-11-16 LAB — CBC
HEMATOCRIT: 27.5 % — AB (ref 36.0–46.0)
HEMOGLOBIN: 9.3 g/dL — AB (ref 12.0–15.0)
MCH: 27.8 pg (ref 26.0–34.0)
MCHC: 33.8 g/dL (ref 30.0–36.0)
MCV: 82.3 fL (ref 78.0–100.0)
Platelets: 179 10*3/uL (ref 150–400)
RBC: 3.34 MIL/uL — ABNORMAL LOW (ref 3.87–5.11)
RDW: 18.5 % — AB (ref 11.5–15.5)
WBC: 8.1 10*3/uL (ref 4.0–10.5)

## 2016-11-16 LAB — URINE CULTURE
CULTURE: NO GROWTH
SPECIAL REQUESTS: NORMAL

## 2016-11-16 LAB — COMPREHENSIVE METABOLIC PANEL
ALT: 35 U/L (ref 14–54)
ANION GAP: 6 (ref 5–15)
AST: 60 U/L — ABNORMAL HIGH (ref 15–41)
Albumin: 1.8 g/dL — ABNORMAL LOW (ref 3.5–5.0)
Alkaline Phosphatase: 427 U/L — ABNORMAL HIGH (ref 38–126)
BILIRUBIN TOTAL: 7.6 mg/dL — AB (ref 0.3–1.2)
BUN: 22 mg/dL — ABNORMAL HIGH (ref 6–20)
CO2: 20 mmol/L — ABNORMAL LOW (ref 22–32)
Calcium: 7.9 mg/dL — ABNORMAL LOW (ref 8.9–10.3)
Chloride: 111 mmol/L (ref 101–111)
Creatinine, Ser: 0.81 mg/dL (ref 0.44–1.00)
GFR calc Af Amer: >60 mL/min (ref >60)
Glucose, Bld: 74 mg/dL (ref 65–99)
POTASSIUM: 3.8 mmol/L (ref 3.5–5.1)
Sodium: 137 mmol/L (ref 135–145)
TOTAL PROTEIN: 5.4 g/dL — AB (ref 6.5–8.1)

## 2016-11-16 LAB — PHOSPHORUS: PHOSPHORUS: 2.4 mg/dL — AB (ref 2.5–4.6)

## 2016-11-16 LAB — TSH: TSH: 1.859 u[IU]/mL (ref 0.350–4.500)

## 2016-11-16 LAB — MAGNESIUM: MAGNESIUM: 1.8 mg/dL (ref 1.7–2.4)

## 2016-11-16 MED ORDER — SODIUM CHLORIDE 0.9% FLUSH
10.0000 mL | INTRAVENOUS | Status: DC | PRN
  Administered 2016-11-17: 10 mL
  Filled 2016-11-16: qty 40

## 2016-11-16 MED ORDER — IOPAMIDOL (ISOVUE-300) INJECTION 61%
INTRAVENOUS | Status: AC
  Administered 2016-11-16: 100 mL via INTRAVENOUS
  Filled 2016-11-16: qty 100

## 2016-11-16 MED ORDER — IOPAMIDOL (ISOVUE-300) INJECTION 61%
INTRAVENOUS | Status: AC
  Filled 2016-11-16: qty 30

## 2016-11-16 MED ORDER — IOPAMIDOL (ISOVUE-300) INJECTION 61%
15.0000 mL | Freq: Two times a day (BID) | INTRAVENOUS | Status: AC | PRN
  Administered 2016-11-16 (×2): 15 mL via ORAL
  Filled 2016-11-16 (×2): qty 30

## 2016-11-16 MED ORDER — ALBUTEROL SULFATE (2.5 MG/3ML) 0.083% IN NEBU: 5.0000 mg | INHALATION_SOLUTION | Freq: Once | RESPIRATORY_TRACT | Status: DC

## 2016-11-16 MED ORDER — SODIUM CHLORIDE 0.9% FLUSH: 10.0000 mL | INTRAVENOUS | Status: DC | PRN

## 2016-11-16 MED ORDER — ADULT MULTIVITAMIN W/MINERALS CH
1.0000 | ORAL_TABLET | Freq: Every day | ORAL | Status: DC
  Administered 2016-11-16 – 2016-11-17 (×2): 1 via ORAL
  Filled 2016-11-16 (×2): qty 1

## 2016-11-16 NOTE — Progress Notes (Signed)
IP PROGRESS NOTE  Subjective:   Lisa Guerra was admitted yesterday with generalized fatigue. She reports having abdominal pain until she had a bowel movement. The right upper quadrant pain she experienced prior to being diagnosed with colon cancer has resolved. Chemotherapy was held last week secondary to failure to thrive.   Objective: Vital signs in last 24 hours: Blood pressure 122/84, pulse 85, temperature 97.9 F (36.6 C), temperature source Oral, resp. rate 18, height 5' 5"  (1.651 m), weight 93 lb 14.7 oz (42.6 kg), SpO2 100 %.  Intake/Output from previous day: 04/22 0701 - 04/23 0700 In: 436.3 [I.V.:436.3] Out: -   Physical Exam:  HEENT: No thrush or ulcers Lungs: Clear bilaterally Cardiac: Regular rate and rhythm Abdomen: Nontender, no hepatosplenomegaly Extremities: No leg edema Neurologic: Alert, follows commands, appears oriented  Portacath/PICC-without erythema  Lab Results:  Recent Labs  11/15/16 1647 11/16/16 0731  WBC 8.1 8.1  HGB 10.3* 9.3*  HCT 30.1* 27.5*  PLT 230 179    BMET  Recent Labs  11/15/16 1647 11/16/16 0731  NA 139 137  K 2.9* 3.8  CL 109 111  CO2 20* 20*  GLUCOSE 103* 74  BUN 27* 22*  CREATININE 1.01* 0.81  CALCIUM 8.2* 7.9*    Studies/Results: Dg Chest 2 View  Result Date: 11/15/2016 CLINICAL DATA:  Weakness and diarrhea x1 week. History of metastatic colon cancer. EXAM: CHEST  2 VIEW COMPARISON:  10/15/2016 radiographs FINDINGS: The heart size and mediastinal contours are within normal limits. Aortic atherosclerosis at the arch without aneurysmal dilatation. Port catheter tip is seen in the proximal to mid SVC. Low lung volumes again noted without pneumonic consolidation nor overt pulmonary edema. Trace pleural effusions noted bilaterally blunting the costophrenic angles. Small calcified granuloma at the right lung base. The visualized skeletal structures are unremarkable. IMPRESSION: Low lung volumes are again noted without  acute pneumonic consolidation nor CHF. Trace bilateral pleural effusions. Electronically Signed   By: Ashley Royalty M.D.   On: 11/15/2016 18:40   Ct Head Wo Contrast  Result Date: 11/16/2016 CLINICAL DATA:  Initial evaluation for acute trauma trauma fall, hit right forehead. EXAM: CT HEAD WITHOUT CONTRAST TECHNIQUE: Contiguous axial images were obtained from the base of the skull through the vertex without intravenous contrast. COMPARISON:  Prior CT from 10/15/2016. FINDINGS: Brain: Generalized cerebral atrophy with mild chronic small vessel ischemic disease. No acute intracranial hemorrhage. No evidence for acute large vessel territory infarct. No mass lesion, midline shift or mass effect. No hydrocephalus. No extra-axial fluid collection. Vascular: No hyperdense vessel. Scattered vascular calcifications noted within the carotid siphons. Skull: Possible tiny scalp contusion at the right forehead. Scalp soft tissues otherwise unremarkable. Calvarium intact. Sinuses/Orbits: Globes and orbital soft tissues within normal limits. Paranasal sinuses are clear. No mastoid effusion. IMPRESSION: 1. No acute intracranial process. 2. Question tiny right frontal scalp contusion. 3. Stable atrophy with chronic microvascular ischemic disease. Electronically Signed   By: Jeannine Boga M.D.   On: 11/16/2016 02:56    Medications: I have reviewed the patient's current medications.  Assessment/Plan: 1. Metastatic colon cancer-adenocarcinoma on biopsy of a cecal mass 09/07/2016  Separate polypoid mass at the sigmoid colon, biopsy to 15 2018 revealed a tubular adenoma with at least high-grade dysplasia  Staging CTs of the chest, abdomen, and pelvis 09/08/2016-extensive liver and lung metastases, bulky abdominal/retroperitoneal lymphadenopathy  Daryel Gerald elevated CEA  Ultrasound-guided biopsy of a liver lesion 09/25/2016 confirmed metastatic colon cancer, Foundation 1 testing- KRAS G13Dpositive, MSI-stable,  intermediate tumor  burden, BRAF negative  Cycle 1 FOLFOX 09/29/2016  Cycle 2 FOLFOX 10/13/2016  Cycle 3 FOLFOX 10/27/2016-leucovorin added  2. Iron deficiency anemia secondary to #1  Red cell transfusion 09/24/2016   IV iron 09/27/2016  2 units of packed red blood cells 10/16/2016  3. Chronic renal insufficiency  4. Polyps noted on the colonoscopy 09/07/2016 including a polyp with high-grade dysplasia  5. Hypertension  6. Family history of multiple cancers including pancreas cancer  7. Lipidemia  8. Hyperbilirubinemia secondary to extensive metastatic disease involving the liver  CT abdomen 09/29/2016 with stable mild intrahepatic biliary dilatation.  Bilirubin improved 10/06/2016, 10/13/2016  9. Bilateral knee effusions left greater than right, erythema/tenderness overlying the right patella03/20/2018-improved  10. Admission with altered mental status 10/15/2016; altered mental status following cycle 3 FOLFOX, etiology unclear  11. Urinary tract infection 11/10/2016  12. Admission 11/15/2016 with failure to thrive   Lisa Guerra has recurrent admissions with failure to thrive. She has completed 3 cycles of FOLFOX chemotherapy which she appears to have tolerated well. I doubt the current presentation is related to toxicity from chemotherapy. She last had chemotherapy 3 weeks ago. The bilirubin is higher. I agree with a restaging CT to follow-up on the primary tumor and liver lesions.  She completed treatment for a urinary tract infection. I doubt this explains the current hospital admission.  I will follow-up on the CT result and discuss treatment options with Lisa Guerra and her family.    LOS: 0 days   Betsy Coder, MD   11/16/2016, 1:58 PM

## 2016-11-16 NOTE — Evaluation (Addendum)
Physical Therapy Evaluation Patient Details Name: Lisa Guerra MRN: [id number] DOB: September 03, 1945 Today's Date: 11/16/2016   History of Present Illness   71 y.o. female with medical history significant of metastatic colon cancer with metastases to the liver iron deficiency anemia, CKD, HTN hyperlipidemia admitted with debility, dehydration, elevated LFT and lactic acidosis. Recent admission 3/22-3/24 for AMS related to elevated LFT.   Clinical Impression  Pt admitted with above diagnosis. Pt currently with functional limitations due to the deficits listed below (see PT Problem List).  Pt ambulated 120' with RW, distance limited by fatigue, HR 109 walking. Pt reports 3-4 falls in past 1 year, HHPT recommended for balance training, strengthening,  and home safety eval.  Pt will benefit from skilled PT to increase their independence and safety with mobility to allow discharge to the venue listed below.       Follow Up Recommendations Home health PT; supervision for mobility    Equipment Recommendations  3in1 (PT)    Recommendations for Other Services       Precautions / Restrictions Precautions Precautions: Fall Precaution Comments: pt reports 3-4 falls in past 1 year, she attributes them to weakness, most recent fall was just prior to admission Restrictions Weight Bearing Restrictions: No      Mobility  Bed Mobility Overal bed mobility: Needs Assistance Bed Mobility: Supine to Sit     Supine to sit: HOB elevated;Min guard     General bed mobility comments: up in chair  Transfers Overall transfer level: Needs assistance Equipment used: Rolling walker (2 wheeled) Transfers: Sit to/from Stand Sit to Stand: Min assist         General transfer comment: verbal cues for hand placement   Ambulation/Gait Ambulation/Gait assistance: Min guard Ambulation Distance (Feet): 120 Feet Assistive device: Rolling walker (2 wheeled) Gait Pattern/deviations: Step-through  pattern;Decreased step length - right;Decreased step length - left   Gait velocity interpretation: Below normal speed for age/gender General Gait Details: steady with RW, no LOB, HR 109 walking, distance limited by pt fatigue  Stairs            Wheelchair Mobility    Modified Rankin (Stroke Patients Only)       Balance Overall balance assessment: Needs assistance Sitting-balance support: Bilateral upper extremity supported;Feet supported Sitting balance-Leahy Scale: Good       Standing balance-Leahy Scale: Poor Standing balance comment: requires UE support to maintain standing balance                              Pertinent Vitals/Pain Pain Assessment: No/denies pain    Home Living Family/patient expects to be discharged to:: Private residence Living Arrangements: Children Available Help at Discharge: Family;Available 24 hours/day Type of Home: House Home Access: Level entry     Home Layout: Able to live on main level with bedroom/bathroom Home Equipment: Walker - 2 wheels Additional Comments: Pt reports children take turns staying with her to provide 24 hour assistance     Prior Function Level of Independence: Needs assistance   Gait / Transfers Assistance Needed: started using RW recently due to weakness  ADL's / Homemaking Assistance Needed: Family assists with tub transfers, some lower body dressing        Hand Dominance        Extremity/Trunk Assessment   Upper Extremity Assessment Upper Extremity Assessment: Generalized weakness    Lower Extremity Assessment Lower Extremity Assessment: Generalized weakness (B knee ext -4/5)  Cervical / Trunk Assessment Cervical / Trunk Assessment: Normal  Communication   Communication: No difficulties  Cognition Arousal/Alertness: Awake/alert Behavior During Therapy: WFL for tasks assessed/performed Overall Cognitive Status: Within Functional Limits for tasks assessed                                         General Comments      Exercises     Assessment/Plan    PT Assessment Patient needs continued PT services  PT Problem List Decreased strength;Decreased activity tolerance;Decreased balance;Decreased mobility       PT Treatment Interventions      PT Goals (Current goals can be found in the Care Plan section)  Acute Rehab PT Goals Patient Stated Goal: likes to watch tv, sit outside, crochet PT Goal Formulation: With patient Time For Goal Achievement: 11/30/16 Potential to Achieve Goals: Good    Frequency Min 3X/week   Barriers to discharge        Co-evaluation               End of Session Equipment Utilized During Treatment: Gait belt Activity Tolerance: Patient limited by fatigue Patient left: in chair;with call bell/phone within reach;with chair alarm set Nurse Communication: Mobility status PT Visit Diagnosis: History of falling (Z91.81);Muscle weakness (generalized) (M62.81)    Time: 1043-1100 PT Time Calculation (min) (ACUTE ONLY): 17 min   Charges:   PT Evaluation $PT Eval Low Complexity: 1 Procedure     PT G Codes:   PT G-Codes **NOT FOR INPATIENT CLASS** Functional Assessment Tool Used: AM-PAC 6 Clicks Basic Mobility Functional Limitation: Mobility: Walking and moving around Mobility: Walking and Moving Around Current Status (H5456): At least 20 percent but less than 40 percent impaired, limited or restricted Mobility: Walking and Moving Around Goal Status [phone]): At least 1 percent but less than 20 percent impaired, limited or restricted     Philomena Doheny 11/16/2016, 11:10 AM [phone]

## 2016-11-16 NOTE — Progress Notes (Signed)
Lactulose offered x4. Education provided regarding the importance of medication. Pt refused

## 2016-11-16 NOTE — Progress Notes (Signed)
Initial Nutrition Assessment  DOCUMENTATION CODES:   Severe malnutrition in context of chronic illness, Underweight  INTERVENTION:  - Continue Ensure Enlive po BID, each supplement provides 350 kcal and 20 grams of protein - Will order daily multivitamin with minerals.  - Continue to encourage PO intakes of meals, supplements, and beverages. - RD will continue to monitor for additional nutrition-related needs.  NUTRITION DIAGNOSIS:   Malnutrition (severe) related to chronic illness, catabolic illness, cancer and cancer related treatments as evidenced by severe depletion of muscle mass, severe depletion of body fat.  GOAL:   Patient will meet greater than or equal to 90% of their needs  MONITOR:   PO intake, Supplement acceptance, Weight trends, Labs  REASON FOR ASSESSMENT:   Consult Assessment of nutrition requirement/status  ASSESSMENT:   71 y.o. female with medical history significant of metastatic colon cancer with metastases to the liver, iron deficiency anemia, CKD, HTN, and hyperlipidemia. Presented with generalized fatigue she is so weak she is unable to get to the bathroom on time and have had episodes of bowel incontinence. She states she continues to eat and drink but had progressive difficulty getting up from the bed. Patient received cycle 3 of her FOLFOX chemotherapy on April 13,018 so thereafter she became confused and had a fall hitting her head. She was started on lactulose. Bowel movements have become more frequent after administration of lactulose but patient stopped taking lactulose and states no confusion.  Pt seen for consult. BMI indicates underweight status. No intakes documented since admission. Information provided by pt and son, who is sitting at bedside.  Pt was seen by this RD on 10/16/16. Since that time pt's appetite has mainly been poor and most days she would only eat bites at meals and sips throughout the day. Son states family has been concerned about  poor meal intakes and poor fluid intake and that pt has experienced several episodes of dehydration requiring hospitalization. He states that family is not pushy but they do consistently provide encouragement to the pt and that in the two days PTA pt was eating and drinking more than her usual. She has Ensure at home but expresses concern for N/V if she drinks them. Son reports that pt has tried sips of them in the past and stated she did enjoy the taste, especially the chocolate flavor. Pt is agreeable to trying them again during admission and requests they be very cold. She does not necessarily tolerate cold items better but does like all of her beverages to be very cold.   She denies any chewing or swallowing difficulties. She and son report that pt sometimes has nausea for a day or two after chemo, specifically if she refuses to take anti-nausea medication. Even with this it is unusual for her to vomit more than one time; son states mainly spitting and dry heaving. She denies nausea being associated with any particular food or drink and denies nausea at this time.   Per Dr. Gearldine Shown note this AM, pt was to have chemo last week but it was held d/t FTT. Pt drinking contrast during RD visit and notes indicate plan for re-staging CT.  Physical assessment shows severe muscle and fat wasting to all areas, no edema. Per chart review, many weight fluctuations over the past two months. Most recently, pt has lost 4 lbs (4% body weight) in 1 month, which is not significant for time frame but is significant for already severely malnourished pt.  Medications reviewed; 20 mg oral  lactulose BID, 2 g IV Mg sulfate x1 run yesterday, 500 mg KPhos Neutral BID, 30 mEq oral KCl x1 dose yesterday, 10 mEq IV KCl x3 runs yesterday, 100 mg oral thiamine/day.  Labs reviewed; BUN: 22 mg/dL, Ca: 7.9 mg/dL, Phos: 2.4 mg/dL, Alk Phos and AST elevated.    Diet Order:  Diet Heart Room service appropriate? Yes; Fluid consistency:  Thin  Skin:  Reviewed, no issues  Last BM:  4/21 (PTA)  Height:   Ht Readings from Last 1 Encounters:  11/15/16 5' 5"  (1.651 m)    Weight:   Wt Readings from Last 1 Encounters:  11/15/16 93 lb 14.7 oz (42.6 kg)    Ideal Body Weight:  56.82 kg  BMI:  Body mass index is 15.63 kg/m.  Estimated Nutritional Needs:   Kcal:  1620-1830 (38-41 kcal/kg)  Protein:  65-75 grams (~1.5-1.7 grams/kg)  Fluid:  >/= 1.7 L/day  EDUCATION NEEDS:   No education needs identified at this time    Jarome Matin, MS, RD, LDN, CNSC Inpatient Clinical Dietitian Pager # [phone] After hours/weekend pager # [phone]

## 2016-11-16 NOTE — Progress Notes (Signed)
PROGRESS NOTE  Lisa Guerra  SWN:[id number] DOB: 05-Aug-1945 DOA: 11/15/2016 PCP: [name], MD   Brief Narrative: Lisa Guerra is a 71 y.o. female with a history of colon cancer Dx Feb 2018 metastatic to liver and lung s/p 3 cycles FOLFOX, iron-deficiency anemia, and HTN who presented to the ED with failure to thrive. She endorsed gradually worsening constant weakness throughout her body to the point that she was unable to move quickly enough to get to the bathroom on time. She's had multiple admissions with similar presentations, most recently put on lactulose for elevated ammonia related to liver metastases, and recently delaying chemotherapy due to progressive decline. On arrival, she appeared frail with reassuring exam and vital signs. She was jaundiced but had no abdominal pain. Bilirubin elevated to 6.9 from 2.02 the prior week. UA was normal, though she'd recently completed 3 days of cipro for UTI. CXR had no infiltrate. Due to recent fall, CT head was ordered and non-acute. Dr. Benay Spice has evaluated the patient. We will repeat CT abdomen to see if there are findings that may indicate amenability to endoscopic management for hyperbilirubinemia. Therapy consults have seen the patient, recommending home health and DME as below.   Assessment & Plan: Active Problems:   Colon cancer ([place])   Essential hypertension   Elevated LFTs   Anemia of chronic disease   Protein-calorie malnutrition, severe   UTI (urinary tract infection): Probable   Hypokalemia   Lactic acidosis  Failure to thrive/fall: Related to metastatic colon cancer with extensive liver, lung involvement. CT head with no acute bleeding/infarct.  - PT/OT consulted - IVF's initially, now will deescalate and ensure she can maintain her own hydration by mouth.    Metastatic colon cancer with extensive liver involvement:  - Suspect hyperbilirubinemia due to progressive metastases, but will need restaging CT to evaluate  ducts. This was discussed with Dr. Benay Spice and ordered this morning.  - Ammonia elevated at 74 due to noncomplaince with lactulose. The patient has no evidence of metabolic/hepatic encephalopathy at this time, and lactulose is worsening fecal incontinence, so will allow her to refuse this at this time.   Essential HTN: Chronic, currently soft BPs.  - Hold home medication, norvasc  Hypokalemia and hypomagnesemia:  - Replete and recheck  History of UTI: Treated with cipro x3 days. No symptoms endorsed at this time.  - Monitor urine culture, given CTX in ED x1. No further abx.  Severe protein-calorie malnutrition:  - Nutrition consulted  Anemia of chronic disease:  Hgb slightly below historic baseline at 9.3 after fluid resuscitation. Required transfusions and IV iron in March 2018. - Monitor.   Stage II CKD: Baseline creatinine appears to be ~1. No evidence of AKI on admission. With worsening malnutrition/sarcopenia, anticipate creatinine decreasing despite presence of renal impairment.  - Monitor on IVF's  DVT prophylaxis: SCDs Code Status: Full, ongoing discussions with family and patient per Dr. Benay Spice Family Communication: None at bedside this AM. Will call Son by phone at behest of the patient after CT results available.  Disposition Plan: [place] home with home health 4/24 pending results of CT scan.   Consultants:   Oncology, Dr. Benay Spice  Procedures:   none  Antimicrobials:  Ceftriaxone 4/22   Subjective: Pt without significant complaints. Still feels quite weak, but denies poor po intake, abd pain, N/V. Had diarrhea with lactulose and so she has stopped taking it.   Objective: Vitals:   11/15/16 2050 11/15/16 2145 11/15/16 2208 11/16/16 0528  BP:  131/87 121/86 121/87 122/84  Pulse: 85 84 85 85  Resp: 18  18 18   Temp:   97.7 F (36.5 C) 97.9 F (36.6 C)  TempSrc:   Oral Oral  SpO2: 98% 99% 100% 100%  Weight:   42.6 kg (93 lb 14.7 oz)   Height:   5'  5" (1.651 m)     Intake/Output Summary (Last 24 hours) at 11/16/16 1444 Last data filed at 11/16/16 0600  Gross per 24 hour  Intake           436.25 ml  Output                0 ml  Net           436.25 ml   Filed Weights   11/15/16 2208  Weight: 42.6 kg (93 lb 14.7 oz)    Examination: General exam: Frail female in no distress sitting in bedside chair Respiratory system: Non-labored breathing room air. Clear to auscultation bilaterally.  Cardiovascular system: Regular rate and rhythm. No murmur, rub, or gallop. No JVD, and no pedal edema. Gastrointestinal system: Abdomen soft, non-tender, non-distended, with normoactive bowel sounds. No organomegaly or masses felt. Central nervous system: Alert and oriented. No focal neurological deficits. Extremities: Warm, no deformities Skin: Diffusely jaundiced with icteric sclerae, otherwise no rashes, lesions no ulcers Psychiatry: Judgement and insight appear normal. Mood & affect appropriate.   Data Reviewed: I have personally reviewed following labs and imaging studies  CBC:  Recent Labs Lab 11/10/16 0851 11/15/16 1647 11/16/16 0731  WBC 5.0 8.1 8.1  NEUTROABS 3.6 6.2  --   HGB 11.7 10.3* 9.3*  HCT 35.2 30.1* 27.5*  MCV 84.6 81.1 82.3  PLT 315 230 737   Basic Metabolic Panel:  Recent Labs Lab 11/10/16 0851 11/15/16 1647 11/15/16 2119 11/16/16 0731  NA 140 139  --  137  K 4.3 2.9*  --  3.8  CL  --  109  --  111  CO2 22 20*  --  20*  GLUCOSE 91 103*  --  74  BUN 41.0* 27*  --  22*  CREATININE 1.6* 1.01*  --  0.81  CALCIUM 8.3* 8.2*  --  7.9*  MG  --   --   --  1.8  PHOS  --   --  1.9* 2.4*   GFR: Estimated Creatinine Clearance: 44.7 mL/min (by C-G formula based on SCr of 0.81 mg/dL). Liver Function Tests:  Recent Labs Lab 11/10/16 0851 11/15/16 1647 11/16/16 0731  AST 42* 58* 60*  ALT 30 37 35  ALKPHOS 457* 455* 427*  BILITOT 2.02* 6.9* 7.6*  PROT 5.8* 5.6* 5.4*  ALBUMIN 2.1* 2.0* 1.8*   No results for  input(s): LIPASE, AMYLASE in the last 168 hours.  Recent Labs Lab 11/15/16 2045  AMMONIA 74*   Coagulation Profile:  Recent Labs Lab 11/15/16 2034  INR 1.23   Cardiac Enzymes: No results for input(s): CKTOTAL, CKMB, CKMBINDEX, TROPONINI in the last 168 hours. BNP (last 3 results) No results for input(s): PROBNP in the last 8760 hours. HbA1C: No results for input(s): HGBA1C in the last 72 hours. CBG: No results for input(s): GLUCAP in the last 168 hours. Lipid Profile: No results for input(s): CHOL, HDL, LDLCALC, TRIG, CHOLHDL, LDLDIRECT in the last 72 hours. Thyroid Function Tests:  Recent Labs  11/16/16 0910  TSH 1.859   Anemia Panel: No results for input(s): VITAMINB12, FOLATE, FERRITIN, TIBC, IRON, RETICCTPCT in the last 72 hours. Urine  analysis:    Component Value Date/Time   COLORURINE AMBER (A) 11/15/2016 1603   APPEARANCEUR CLEAR 11/15/2016 1603   LABSPEC 1.020 11/15/2016 1603   LABSPEC 1.025 11/10/2016 1010   PHURINE 5.0 11/15/2016 1603   GLUCOSEU NEGATIVE 11/15/2016 1603   GLUCOSEU Negative 11/10/2016 1010   HGBUR SMALL (A) 11/15/2016 1603   BILIRUBINUR MODERATE (A) 11/15/2016 1603   BILIRUBINUR Negative 11/10/2016 1010   KETONESUR NEGATIVE 11/15/2016 1603   PROTEINUR 30 (A) 11/15/2016 1603   UROBILINOGEN 0.2 11/10/2016 1010   NITRITE NEGATIVE 11/15/2016 1603   LEUKOCYTESUR NEGATIVE 11/15/2016 1603   LEUKOCYTESUR Negative 11/10/2016 1010   Recent Results (from the past 240 hour(s))  Urine Culture     Status: Abnormal   Collection Time: 11/10/16 10:10 AM  Result Value Ref Range Status   Urine Culture, Routine Final report (A)  Final   Urine Culture result 1 Escherichia coli (A)  Final    Comment: Greater than 100,000 colony forming units per mL Cefazolin <=4 ug/mL Cefazolin with an MIC <=16 predicts susceptibility to the oral agents cefaclor, cefdinir, cefpodoxime, cefprozil, cefuroxime, cephalexin, and loracarbef when used for therapy of  uncomplicated urinary tract infections due to E. coli, Klebsiella pneumoniae, and Proteus mirabilis.    ANTIMICROBIAL SUSCEPTIBILITY Comment  Final    Comment:       ** S = Susceptible; I = Intermediate; R = Resistant **                    P = Positive; N = Negative             MICS are expressed in micrograms per mL    Antibiotic                 RSLT#1    RSLT#2    RSLT#3    RSLT#4 Amoxicillin/Clavulanic Acid    S Ampicillin                     R Cefepime                       S Ceftriaxone                    S Cefuroxime                     S Cephalothin                    S Ciprofloxacin                  S Ertapenem                      S Gentamicin                     S Imipenem                       S Levofloxacin                   S Nitrofurantoin                 S Piperacillin                   R Tetracycline                   S  Tobramycin                     S Trimethoprim/Sulfa             S   Urine culture     Status: None   Collection Time: 11/15/16  4:03 PM  Result Value Ref Range Status   Specimen Description URINE, CATHETERIZED  Final   Special Requests Normal  Final   Culture   Final    NO GROWTH Performed at [facility], [address], [place], [place] [address]    Report Status 11/16/2016 FINAL  Final  Culture, blood (routine x 2)     Status: None (Preliminary result)   Collection Time: 11/15/16  5:01 PM  Result Value Ref Range Status   Specimen Description BLOOD PORTA CATH  Final   Special Requests   Final    BOTTLES DRAWN AEROBIC AND ANAEROBIC Blood Culture adequate volume   Culture   Final    NO GROWTH < 24 HOURS Performed at [facility], [place] [address] [place], [place] [address]    Report Status PENDING  Incomplete  Culture, blood (routine x 2)     Status: None (Preliminary result)   Collection Time: 11/15/16  5:16 PM  Result Value Ref Range Status   Specimen Description BLOOD LEFT ANTECUBITAL  Final   Special Requests   Final     BOTTLES DRAWN AEROBIC AND ANAEROBIC Blood Culture adequate volume   Culture   Final    NO GROWTH < 24 HOURS Performed at [facility], [place] [address], [place], [place] [address]    Report Status PENDING  Incomplete      Radiology Studies: Dg Chest 2 View  Result Date: 11/15/2016 CLINICAL DATA:  Weakness and diarrhea x1 week. History of metastatic colon cancer. EXAM: CHEST  2 VIEW COMPARISON:  10/15/2016 radiographs FINDINGS: The heart size and mediastinal contours are within normal limits. Aortic atherosclerosis at the arch without aneurysmal dilatation. Port catheter tip is seen in the proximal to mid SVC. Low lung volumes again noted without pneumonic consolidation nor overt pulmonary edema. Trace pleural effusions noted bilaterally blunting the costophrenic angles. Small calcified granuloma at the right lung base. The visualized skeletal structures are unremarkable. IMPRESSION: Low lung volumes are again noted without acute pneumonic consolidation nor CHF. Trace bilateral pleural effusions. Electronically Signed   By: Ashley Royalty M.D.   On: 11/15/2016 18:40   Ct Head Wo Contrast  Result Date: 11/16/2016 CLINICAL DATA:  Initial evaluation for acute trauma trauma fall, hit right forehead. EXAM: CT HEAD WITHOUT CONTRAST TECHNIQUE: Contiguous axial images were obtained from the base of the skull through the vertex without intravenous contrast. COMPARISON:  Prior CT from 10/15/2016. FINDINGS: Brain: Generalized cerebral atrophy with mild chronic small vessel ischemic disease. No acute intracranial hemorrhage. No evidence for acute large vessel territory infarct. No mass lesion, midline shift or mass effect. No hydrocephalus. No extra-axial fluid collection. Vascular: No hyperdense vessel. Scattered vascular calcifications noted within the carotid siphons. Skull: Possible tiny scalp contusion at the right forehead. Scalp soft tissues otherwise unremarkable. Calvarium intact. Sinuses/Orbits: Globes  and orbital soft tissues within normal limits. Paranasal sinuses are clear. No mastoid effusion. IMPRESSION: 1. No acute intracranial process. 2. Question tiny right frontal scalp contusion. 3. Stable atrophy with chronic microvascular ischemic disease. Electronically Signed   By: Jeannine Boga M.D.   On: 11/16/2016 02:56    Scheduled Meds: .  feeding supplement (ENSURE ENLIVE)  237 mL Oral BID BM  . iopamidol      . lactulose  20 g Oral BID  . phosphorus  500 mg Oral BID  . sodium chloride flush  3 mL Intravenous Q12H  . thiamine  100 mg Oral Daily   Continuous Infusions:   LOS: 0 days   Time spent: 25 minutes.  Vance Gather, MD [facility] Pager [phone]  If 7PM-7AM, please contact night-coverage [email or web address] Password [facility] 11/16/2016, 2:44 PM

## 2016-11-16 NOTE — Evaluation (Signed)
Occupational Therapy Evaluation Patient Details Name: Lisa Guerra MRN: [id number] DOB: 04/26/1946 Today's Date: 11/16/2016    History of Present Illness  71 y.o. female with medical history significant of metastatic colon cancer with metastases to the liver iron deficiency anemia, CKD, HTN hyperlipidemia admitted with debility, dehydration, elevated LFT and lactic acidosis. Recent admission 3/22-3/24 for AMS related to elevated LFT.    Clinical Impression   This 71 y/o F presents with the above. Pt with decreased activity tolerance, and was requiring assist from son to complete some ADLs prior to admission. Pt will benefit from continued acute OT services to increase safety and independence with ADLs and functional mobility. Goals are for supervision to Independence.     Follow Up Recommendations  No OT follow up;Supervision/Assistance - 24 hour    Equipment Recommendations  3 in 1 bedside commode           Precautions / Restrictions Precautions Precautions: Fall Restrictions Weight Bearing Restrictions: No      Mobility Bed Mobility Overal bed mobility: Needs Assistance Bed Mobility: Supine to Sit     Supine to sit: HOB elevated;Min guard     General bed mobility comments: increased time to complete   Transfers Overall transfer level: Needs assistance Equipment used: Rolling walker (2 wheeled) Transfers: Sit to/from Stand Sit to Stand: Min assist         General transfer comment: verbal cues for hand placement     Balance Overall balance assessment: Needs assistance Sitting-balance support: Bilateral upper extremity supported;Feet supported Sitting balance-Leahy Scale: Fair         Standing balance comment: requires UE support to maintain standing balance                            ADL either performed or assessed with clinical judgement   ADL Overall ADL's : Needs assistance/impaired Eating/Feeding: Independent;Sitting   Grooming: Wash/dry  hands;Min guard;Standing   Upper Body Bathing: Min guard;Sitting   Lower Body Bathing: Min guard;Sit to/from stand   Upper Body Dressing : Set up;Sitting   Lower Body Dressing: Minimal assistance;Sit to/from stand   Toilet Transfer: Minimal assistance;Ambulation;Comfort height toilet;RW;Grab bars   Toileting- Clothing Manipulation and Hygiene: Minimal assistance;Sit to/from stand       Functional mobility during ADLs: Min guard;Rolling walker General ADL Comments: Pt completed room level functional mobility with RW, toilet transfer, toileting, standing grooming ADLs                         Pertinent Vitals/Pain Pain Assessment: No/denies pain     Hand Dominance     Extremity/Trunk Assessment Upper Extremity Assessment Upper Extremity Assessment: Generalized weakness           Communication Communication Communication: No difficulties   Cognition Arousal/Alertness: Awake/alert Behavior During Therapy: WFL for tasks assessed/performed Overall Cognitive Status: Within Functional Limits for tasks assessed                                     General Comments                  Home Living Family/patient expects to be discharged to:: Private residence Living Arrangements: Children Available Help at Discharge: Family;Available 24 hours/day Type of Home: House             Bathroom Shower/Tub:  Tub/shower unit             Additional Comments: Pt reports children take turns staying with her to provide 24 hour assistance       Prior Functioning/Environment Level of Independence: Needs assistance    ADL's / Homemaking Assistance Needed: Family assists with tub transfers, some lower body dressing            OT Problem List: Decreased strength;Decreased activity tolerance      OT Treatment/Interventions: Self-care/ADL training;Therapeutic exercise;Energy conservation;DME and/or AE instruction;Patient/family  education;Therapeutic activities    OT Goals(Current goals can be found in the care plan section) Acute Rehab OT Goals Patient Stated Goal: to be more independent  OT Goal Formulation: With patient Time For Goal Achievement: 11/23/16 Potential to Achieve Goals: Good ADL Goals Pt Will Perform Grooming: with supervision;standing Pt Will Perform Lower Body Dressing: sit to/from stand;with min assist Pt Will Perform Toileting - Clothing Manipulation and hygiene: with min guard assist;sit to/from stand Additional ADL Goal #1: Pt will independently demonstrate at least one energy conservation technique during ADL task completion.   OT Frequency: Min 2X/week   Barriers to D/C:                          End of Session Equipment Utilized During Treatment: Gait belt;Rolling walker Nurse Communication: Other (comment) (DME needs )  Activity Tolerance: Patient tolerated treatment well Patient left: in chair;with call bell/phone within reach;with chair alarm set  OT Visit Diagnosis: Muscle weakness (generalized) (M62.81)                Time: 0721-8288 OT Time Calculation (min): 32 min Charges:  OT General Charges $OT Visit: 1 Procedure OT Evaluation $OT Eval Low Complexity: 1 Procedure OT Treatments $Self Care/Home Management : 8-22 mins G-Codes: OT G-codes **NOT FOR INPATIENT CLASS** Functional Assessment Tool Used: Clinical judgement Functional Limitation: Self care Self Care Current Status (F3744): At least 40 percent but less than 60 percent impaired, limited or restricted Self Care Goal Status (Z1460): At least 20 percent but less than 40 percent impaired, limited or restricted   Lou Cal, OT Pager [phone] 11/16/2016  Raymondo Band 11/16/2016, 10:56 AM

## 2016-11-17 ENCOUNTER — Ambulatory Visit: Payer: Medicare Other

## 2016-11-17 ENCOUNTER — Ambulatory Visit: Payer: Medicare Other | Admitting: Nurse Practitioner

## 2016-11-17 ENCOUNTER — Other Ambulatory Visit: Payer: Medicare Other

## 2016-11-17 DIAGNOSIS — C180 Malignant neoplasm of cecum: Secondary | ICD-10-CM | POA: Diagnosis not present

## 2016-11-17 DIAGNOSIS — R109 Unspecified abdominal pain: Secondary | ICD-10-CM | POA: Diagnosis not present

## 2016-11-17 DIAGNOSIS — J90 Pleural effusion, not elsewhere classified: Secondary | ICD-10-CM

## 2016-11-17 DIAGNOSIS — C787 Secondary malignant neoplasm of liver and intrahepatic bile duct: Secondary | ICD-10-CM | POA: Diagnosis not present

## 2016-11-17 DIAGNOSIS — C189 Malignant neoplasm of colon, unspecified: Secondary | ICD-10-CM | POA: Diagnosis not present

## 2016-11-17 DIAGNOSIS — E43 Unspecified severe protein-calorie malnutrition: Secondary | ICD-10-CM | POA: Diagnosis not present

## 2016-11-17 DIAGNOSIS — R5383 Other fatigue: Secondary | ICD-10-CM | POA: Diagnosis not present

## 2016-11-17 DIAGNOSIS — D638 Anemia in other chronic diseases classified elsewhere: Secondary | ICD-10-CM | POA: Diagnosis not present

## 2016-11-17 LAB — COMPREHENSIVE METABOLIC PANEL
ALK PHOS: 445 U/L — AB (ref 38–126)
ALT: 39 U/L (ref 14–54)
ANION GAP: 6 (ref 5–15)
AST: 60 U/L — ABNORMAL HIGH (ref 15–41)
Albumin: 1.7 g/dL — ABNORMAL LOW (ref 3.5–5.0)
BUN: 19 mg/dL (ref 6–20)
CALCIUM: 7.8 mg/dL — AB (ref 8.9–10.3)
CO2: 21 mmol/L — AB (ref 22–32)
CREATININE: 0.78 mg/dL (ref 0.44–1.00)
Chloride: 112 mmol/L — ABNORMAL HIGH (ref 101–111)
GFR calc non Af Amer: >60 mL/min (ref >60)
Glucose, Bld: 99 mg/dL (ref 65–99)
Potassium: 3.4 mmol/L — ABNORMAL LOW (ref 3.5–5.1)
SODIUM: 139 mmol/L (ref 135–145)
TOTAL PROTEIN: 5 g/dL — AB (ref 6.5–8.1)
Total Bilirubin: 8 mg/dL — ABNORMAL HIGH (ref 0.3–1.2)

## 2016-11-17 LAB — BILIRUBIN, FRACTIONATED(TOT/DIR/INDIR)
BILIRUBIN DIRECT: 5 mg/dL — AB (ref 0.1–0.5)
BILIRUBIN INDIRECT: 3 mg/dL — AB (ref 0.3–0.9)
Total Bilirubin: 8 mg/dL — ABNORMAL HIGH (ref 0.3–1.2)

## 2016-11-17 MED ORDER — HEPARIN SOD (PORK) LOCK FLUSH 100 UNIT/ML IV SOLN
500.0000 [IU] | INTRAVENOUS | Status: AC | PRN
  Administered 2016-11-17: 500 [IU]

## 2016-11-17 MED ORDER — POTASSIUM CHLORIDE CRYS ER 20 MEQ PO TBCR
20.0000 meq | EXTENDED_RELEASE_TABLET | Freq: Once | ORAL | Status: AC
  Administered 2016-11-17: 20 meq via ORAL
  Filled 2016-11-17: qty 1

## 2016-11-17 NOTE — Progress Notes (Signed)
IP PROGRESS NOTE  Subjective:   Lisa Guerra reports feeling better. No pain. She is eating. She is having bowel movements.   Objective: Vital signs in last 24 hours: Blood pressure (!) 129/95, pulse 89, temperature 98.1 F (36.7 C), temperature source Oral, resp. rate 18, height _0  (1.651 m), weight 93 lb 14.7 oz (42.6 kg), SpO2 100 %.  Intake/Output from previous day: 04/23 0701 - 04/24 0700 In: 10 [I.V.:10] Out: -   Physical Exam:  HEENT: No thrush or ulcers Lungs: Clear bilaterally Cardiac: Regular rate and rhythm Abdomen: Nontender, no hepatosplenomegaly Extremities: No leg edema Neurologic: Alert, follows commands, appears oriented  Portacath/PICC-without erythema  Lab Results:  Recent Labs  11/15/16 1647 11/16/16 0731  WBC 8.1 8.1  HGB 10.3* 9.3*  HCT 30.1* 27.5*  PLT 230 179    BMET  Recent Labs  11/16/16 0731 11/17/16 0501  NA 137 139  K 3.8 3.4*  CL 111 112*  CO2 20* 21*  GLUCOSE 74 99  BUN 22* 19  CREATININE 0.81 0.78  CALCIUM 7.9* 7.8*    Studies/Results: Dg Chest 2 View  Result Date: 11/15/2016 CLINICAL DATA:  Weakness and diarrhea x1 week. History of metastatic colon cancer. EXAM: CHEST  2 VIEW COMPARISON:  10/15/2016 radiographs FINDINGS: The heart size and mediastinal contours are within normal limits. Aortic atherosclerosis at the arch without aneurysmal dilatation. Port catheter tip is seen in the proximal to mid SVC. Low lung volumes again noted without pneumonic consolidation nor overt pulmonary edema. Trace pleural effusions noted bilaterally blunting the costophrenic angles. Small calcified granuloma at the right lung base. The visualized skeletal structures are unremarkable. IMPRESSION: Low lung volumes are again noted without acute pneumonic consolidation nor CHF. Trace bilateral pleural effusions. Electronically Signed   By: Ashley Royalty M.D.   On: 11/15/2016 18:40   Ct Head Wo Contrast  Result Date: 11/16/2016 CLINICAL DATA:   Initial evaluation for acute trauma trauma fall, hit right forehead. EXAM: CT HEAD WITHOUT CONTRAST TECHNIQUE: Contiguous axial images were obtained from the base of the skull through the vertex without intravenous contrast. COMPARISON:  Prior CT from 10/15/2016. FINDINGS: Brain: Generalized cerebral atrophy with mild chronic small vessel ischemic disease. No acute intracranial hemorrhage. No evidence for acute large vessel territory infarct. No mass lesion, midline shift or mass effect. No hydrocephalus. No extra-axial fluid collection. Vascular: No hyperdense vessel. Scattered vascular calcifications noted within the carotid siphons. Skull: Possible tiny scalp contusion at the right forehead. Scalp soft tissues otherwise unremarkable. Calvarium intact. Sinuses/Orbits: Globes and orbital soft tissues within normal limits. Paranasal sinuses are clear. No mastoid effusion. IMPRESSION: 1. No acute intracranial process. 2. Question tiny right frontal scalp contusion. 3. Stable atrophy with chronic microvascular ischemic disease. Electronically Signed   By: Jeannine Boga M.D.   On: 11/16/2016 02:56   Ct Abdomen Pelvis W Contrast  Addendum Date: 11/17/2016   ADDENDUM REPORT: 11/17/2016 08:53 ADDENDUM: Upon further review, there does appear to be at least a mild degree of intrahepatic biliary dilatation and possibly common bile duct dilatation. Ultrasound or MRCP may be performed for further evaluation. Electronically Signed   By: Marijo Conception, M.D.   On: 11/17/2016 08:53   Result Date: 11/17/2016 CLINICAL DATA:  Metastatic colon cancer. EXAM: CT ABDOMEN AND PELVIS WITH CONTRAST TECHNIQUE: Multidetector CT imaging of the abdomen and pelvis was performed using the standard protocol following bolus administration of intravenous contrast. CONTRAST:  1104m ISOVUE-300 IOPAMIDOL (ISOVUE-300) INJECTION 61% COMPARISON:  CT scan  of September 29, 2016 and September 10, 2016. FINDINGS: Lower chest: Minimal left pleural  effusion is noted with adjacent subsegmental atelectasis. Several small pulmonary nodules are noted bilaterally consistent with metastatic disease. Hepatobiliary: Stable multiple hepatic metastatic lesions are noted, most prominently seen in the right hepatic lobe. No gallstones are noted. Pancreas: Unremarkable. No pancreatic ductal dilatation or surrounding inflammatory changes. Spleen: Normal in size without focal abnormality. Adrenals/Urinary Tract: 5.1 cm right adrenal mass is again noted, which is increased compared to prior exam and consistent with metastatic disease. Left adrenal gland is unremarkable. No hydronephrosis or renal obstruction is noted. Urinary bladder is unremarkable. Stomach/Bowel: Continued presence of mass involving the hepatic flexure of the colon, consistent with malignancy. This currently measures 5.2 x 3.7 cm. No small bowel dilatation is noted. Mild dilatation of the cecum is noted. The appendix is unremarkable. Vascular/Lymphatic: Aortic atherosclerosis is noted. Stable retroperitoneal adenopathy is noted consistent with metastatic disease. Reproductive: There is again noted large complex low density measuring 9.4 x 7.9 cm in the pelvis, which may represent exophytic degenerating fibroid as described on previous exam. Ovaries appear to be unremarkable. Other: Mild anasarca is noted.  Mild ascites is noted. Musculoskeletal: No acute or significant osseous findings. IMPRESSION: There is again noted mass lesion involving hepatic flexure of colon consistent with malignancy. Diffuse hepatic metastases are noted as well as retroperitoneal adenopathy also consistent with metastatic disease. Small bilateral pulmonary nodules are also noted consistent with metastatic disease. Right adrenal mass is enlarged compared to prior exam consistent with enlarging metastatic lesion. 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 Mild anasarca  and ascites is noted. Electronically Signed: By: Marijo Conception, M.D. On: 11/16/2016 16:31    Medications: I have reviewed the patient's current medications.  Assessment/Plan: 1. Metastatic colon cancer-adenocarcinoma on biopsy of a cecal mass 09/07/2016  Separate polypoid mass at the sigmoid colon, biopsy to 15 2018 revealed a tubular adenoma with at least high-grade dysplasia  Staging CTs of the chest, abdomen, and pelvis 09/08/2016-extensive liver and lung metastases, bulky abdominal/retroperitoneal lymphadenopathy  Daryel Gerald elevated CEA  Ultrasound-guided biopsy of a liver lesion 09/25/2016 confirmed metastatic colon cancer, Foundation 1 testing- KRAS G13Dpositive, MSI-stable, intermediate tumor burden, BRAF negative  Cycle 1 FOLFOX 09/29/2016  Cycle 2 FOLFOX 10/13/2016  Cycle 3 FOLFOX 10/27/2016-leucovorin added   CT 11/16/2016-stable hepatic lesions, hepatic flexure mass, and mild intrahepatic biliary dilatation. Increased right adrenal mass, new small left pleural effusion.  2. Iron deficiency anemia secondary to #1  Red cell transfusion 09/24/2016   IV iron 09/27/2016  2 units of packed red blood cells 10/16/2016  3. Chronic renal insufficiency  4. Polyps noted on the colonoscopy 09/07/2016 including a polyp with high-grade dysplasia  5. Hypertension  6. Family history of multiple cancers including pancreas cancer  7. Lipidemia  8. Hyperbilirubinemia secondary to extensive metastatic disease involving the liver  CT abdomen 09/29/2016 with stable mild intrahepatic biliary dilatation.  Bilirubin improved 10/06/2016, 10/13/2016  9. Bilateral knee effusions left greater than right, erythema/tenderness overlying the right patella03/20/2018-improved  10. Admission with altered mental status 10/15/2016; altered mental status following cycle 3 FOLFOX, etiology unclear  11. Urinary tract infection 11/10/2016  12. Admission 11/15/2016  with failure to thrive   Lisa Guerra appears improved. I reviewed the CT from yesterday. The overall tumor burden appears stable, but the left pleural effusion appears to be new and the right adrenal lesion has enlarged. There is some degree of  biliary obstruction. The bilirubin is higher. I will discuss the case with radiology and gastroenterology to see if she may benefit from placement of a biliary stent. I will add a CEA to the chemistry panel from today.  We will schedule outpatient follow-up at the [facility] for the week of 11/23/2016.   LOS: 0 days   Betsy Coder, MD   11/17/2016, 1:48 PM

## 2016-11-17 NOTE — Progress Notes (Addendum)
Patient is A&Ox4, ambulatory with walker. No change in am assessment. Instructions reviewed with patient,son and spouse. Questions, concerns denied.

## 2016-11-17 NOTE — Progress Notes (Signed)
Patient is aware of discharge. Currently awaiting arrival of son to complete discharge education

## 2016-11-17 NOTE — Discharge Summary (Signed)
Physician Discharge Summary  Lisa Guerra KDX:[id number] DOB: February 23, 1946 DOA: 11/15/2016  PCP: [name], MD  Admit date: 11/15/2016 Discharge date: 11/17/2016  Admitted From: Home Disposition: Home   Recommendations for Outpatient Follow-up:  1. Follow up with PCP in 1-2 weeks 2. Follow up with oncology, Dr. Benay Spice in the next week. 3. Please obtain CMP in the next week to monitor hyperbilirubinemia. No interventions recommended by interventional radiology based on available imaging during admission.   Home Health: PT, aide Equipment/Devices: None new, previously placed port continued. Discharge Condition: Stable CODE STATUS: Full Diet recommendation: As tolerated  Brief/Interim Summary: Lisa M Cornwellis a 71 y.o.femalewith a history of colon cancer Dx Feb 2018 metastatic to liver and lung s/p 3 cycles FOLFOX, iron-deficiency anemia, and HTN who presented to the ED with failure to thrive. She endorsed gradually worsening constant weakness throughout her body to the point that she was unable to move quickly enough to get to the bathroom on time. She's had multiple admissions with similar presentations, most recently put on lactulose for elevated ammonia related to liver metastases, and recently delaying chemotherapy due to progressive decline. On arrival, she appeared frail with reassuring exam and vital signs. She was jaundiced but had no abdominal pain. Bilirubin elevated to 6.9 from 2.02 the prior week. UA was normal, though she'd recently completed 3 days of cipro for UTI. CXR had no infiltrate. Due to recent fall, CT head was ordered and non-acute. Dr. Benay Spice has evaluated the patient. CT abdomen was repeated showing significant liver metastatic involvement with multiple intrahepatic biliary duct dilatation without significant CBD enlargement. Interventional radiology did not believe that these would be amenable to drainage. Therapy consults have seen the patient,  recommending home health and DME as above. These were arranged.   Discharge Diagnoses:  Active Problems:   Colon cancer ([place])   Essential hypertension   Elevated LFTs   Anemia of chronic disease   Protein-calorie malnutrition, severe   UTI (urinary tract infection): Probable   Hypokalemia   Lactic acidosis  Failure to thrive/fall: Related to metastatic colon cancer with extensive liver, lung involvement. CT head with no acute bleeding/infarct.  - PT/OT consulted: Will arrange home health and DME as recommended. This plan was discussed with her son and DIL with whom she and her husband live.   Metastatic colon cancer with extensive liver involvement:  - Suspect hyperbilirubinemia due to progressive metastases. No stricture in CBD amenable to stenting or drainage per IR. Will recommend rechecking bilirubin and referral to GI if continues to rise. This was discussed with Dr. Benay Spice. - Ammonia elevated at 74 due to noncomplaince with lactulose. The patient has no evidence of metabolic/hepatic encephalopathy at this time, and lactulose is worsening fecal incontinence.  Essential HTN: Chronic, currently soft BPs.  - Hold home medication, norvasc. Restart at discharge.  Hypokalemia and hypomagnesemia:  - Repleted, recheck at follow up  History of UTI: Treated with cipro x3 days. No symptoms endorsed at this time.  - No growth on urine culture, given CTX in ED x1. No further abx.  Severe protein-calorie malnutrition:  - Nutrition consulted  Anemia of chronic disease:  Hgb slightly below historic baseline at 9.3 after fluid resuscitation. Required transfusions and IV iron in March 2018. - Monitor.   Stage II CKD: Baseline creatinine appears to be ~1. No evidence of AKI on admission. With worsening malnutrition/sarcopenia, anticipate creatinine decreasing despite presence of renal impairment.  - Monitor at follow up  Discharge Instructions Discharge Instructions  Discharge  instructions    Complete by:  As directed    You were admitted for weakness and a fall. The work up has shown no evidence problems related to the brain or heart to cause this. Your oncologist believes this is related to the cancer and not the treatments. A repeat CT scan showed stability of the disease without any indication for procedures at this time. Physical and occupational therapists have worked with you and recommended home health physical therapy and a 3 in 1 bedside commode which will be arranged by care management prior to discharge. You are stable for discharge with the following recommendations:  - Continue taking medications as directed listed below. No changes have been made, though please note that you should continue taking lactulose to prevent confusion related to liver disease.  - If your symptoms return, seek medical attention. Otherwise follow up with Dr. Benay Spice as planned.     Allergies as of 11/17/2016   No Known Allergies     Medication List    STOP taking these medications   amLODipine 5 MG tablet Commonly known as:  NORVASC   ciprofloxacin 500 MG tablet Commonly known as:  CIPRO   senna-docusate 8.6-50 MG tablet Commonly known as:  Senokot-S     TAKE these medications   feeding supplement (ENSURE COMPLETE) Liqd Take 237 mLs by mouth 2 (two) times daily between meals.   ferrous sulfate 325 (65 FE) MG tablet Take 1 tablet (325 mg total) by mouth 3 (three) times daily with meals.   lactulose 10 GM/15ML solution Commonly known as:  CHRONULAC Take 30 mLs (20 g total) by mouth 2 (two) times daily.   lidocaine-prilocaine cream Commonly known as:  EMLA Apply 1 application topically as needed. Apply to portacath 1-2 hours prior to use   ondansetron 8 MG tablet Commonly known as:  ZOFRAN Take 1 tablet (8 mg total) by mouth every 8 (eight) hours as needed for nausea or vomiting.   oxyCODONE 5 MG immediate release tablet Commonly known as:  Oxy  IR/ROXICODONE Take 1 tablet (5 mg total) by mouth every 4 (four) hours as needed for moderate pain.            Durable Medical Equipment        Start     Ordered   11/16/16 1514  For home use only DME 3 n 1  Once     11/16/16 1514     Follow-up [place], MD Follow up.   Specialty:  Internal Medicine Contact information: [address] [place] [address] [phone]        Betsy Coder, MD Follow up.   Specialty:  Oncology Contact information: [place] [address] [phone]          No Known Allergies  Consultations:  Oncology, Dr. Benay Spice  Interventional Radiology, Dr. Pascal Lux by phone only  Procedures/Studies: Dg Chest 2 View  Result Date: 11/15/2016 CLINICAL DATA:  Weakness and diarrhea x1 week. History of metastatic colon cancer. EXAM: CHEST  2 VIEW COMPARISON:  10/15/2016 radiographs FINDINGS: The heart size and mediastinal contours are within normal limits. Aortic atherosclerosis at the arch without aneurysmal dilatation. Port catheter tip is seen in the proximal to mid SVC. Low lung volumes again noted without pneumonic consolidation nor overt pulmonary edema. Trace pleural effusions noted bilaterally blunting the costophrenic angles. Small calcified granuloma at the right lung base. The visualized skeletal structures are unremarkable. IMPRESSION:  Low lung volumes are again noted without acute pneumonic consolidation nor CHF. Trace bilateral pleural effusions. Electronically Signed   By: Ashley Royalty M.D.   On: 11/15/2016 18:40   Ct Head Wo Contrast  Result Date: 11/16/2016 CLINICAL DATA:  Initial evaluation for acute trauma trauma fall, hit right forehead. EXAM: CT HEAD WITHOUT CONTRAST TECHNIQUE: Contiguous axial images were obtained from the base of the skull through the vertex without intravenous contrast. COMPARISON:  Prior CT from 10/15/2016. FINDINGS: Brain: Generalized cerebral  atrophy with mild chronic small vessel ischemic disease. No acute intracranial hemorrhage. No evidence for acute large vessel territory infarct. No mass lesion, midline shift or mass effect. No hydrocephalus. No extra-axial fluid collection. Vascular: No hyperdense vessel. Scattered vascular calcifications noted within the carotid siphons. Skull: Possible tiny scalp contusion at the right forehead. Scalp soft tissues otherwise unremarkable. Calvarium intact. Sinuses/Orbits: Globes and orbital soft tissues within normal limits. Paranasal sinuses are clear. No mastoid effusion. IMPRESSION: 1. No acute intracranial process. 2. Question tiny right frontal scalp contusion. 3. Stable atrophy with chronic microvascular ischemic disease. Electronically Signed   By: Jeannine Boga M.D.   On: 11/16/2016 02:56   Ct Abdomen Pelvis W Contrast  Addendum Date: 11/17/2016   ADDENDUM REPORT: 11/17/2016 08:53 ADDENDUM: Upon further review, there does appear to be at least a mild degree of intrahepatic biliary dilatation and possibly common bile duct dilatation. Ultrasound or MRCP may be performed for further evaluation. Electronically Signed   By: Marijo Conception, M.D.   On: 11/17/2016 08:53   Result Date: 11/17/2016 CLINICAL DATA:  Metastatic colon cancer. EXAM: CT ABDOMEN AND PELVIS WITH CONTRAST TECHNIQUE: Multidetector CT imaging of the abdomen and pelvis was performed using the standard protocol following bolus administration of intravenous contrast. CONTRAST:  170mL ISOVUE-300 IOPAMIDOL (ISOVUE-300) INJECTION 61% COMPARISON:  CT scan of September 29, 2016 and September 10, 2016. FINDINGS: Lower chest: Minimal left pleural effusion is noted with adjacent subsegmental atelectasis. Several small pulmonary nodules are noted bilaterally consistent with metastatic disease. Hepatobiliary: Stable multiple hepatic metastatic lesions are noted, most prominently seen in the right hepatic lobe. No gallstones are noted. Pancreas:  Unremarkable. No pancreatic ductal dilatation or surrounding inflammatory changes. Spleen: Normal in size without focal abnormality. Adrenals/Urinary Tract: 5.1 cm right adrenal mass is again noted, which is increased compared to prior exam and consistent with metastatic disease. Left adrenal gland is unremarkable. No hydronephrosis or renal obstruction is noted. Urinary bladder is unremarkable. Stomach/Bowel: Continued presence of mass involving the hepatic flexure of the colon, consistent with malignancy. This currently measures 5.2 x 3.7 cm. No small bowel dilatation is noted. Mild dilatation of the cecum is noted. The appendix is unremarkable. Vascular/Lymphatic: Aortic atherosclerosis is noted. Stable retroperitoneal adenopathy is noted consistent with metastatic disease. Reproductive: There is again noted large complex low density measuring 9.4 x 7.9 cm in the pelvis, which may represent exophytic degenerating fibroid as described on previous exam. Ovaries appear to be unremarkable. Other: Mild anasarca is noted.  Mild ascites is noted. Musculoskeletal: No acute or significant osseous findings. IMPRESSION: There is again noted mass lesion involving hepatic flexure of colon consistent with malignancy. Diffuse hepatic metastases are noted as well as retroperitoneal adenopathy also consistent with metastatic disease. Small bilateral pulmonary nodules are also noted consistent with metastatic disease. Right adrenal mass is enlarged compared to prior exam consistent with enlarging metastatic lesion. 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 Mild anasarca and  ascites is noted. Electronically Signed: By: Marijo Conception, M.D. On: 11/16/2016 16:31   Subjective: Patient without complaints. Husband at bedside reports she's at baseline. Ambulating with assistance/supervision. No abdominal pain, N/V/D.  Discharge Exam: BP (!) 129/95 (BP Location:  Right Arm)   Pulse 89   Temp 98.1 F (36.7 C) (Oral)   Resp 18   Ht 5\' 5"  (1.651 m)   Wt 42.6 kg (93 lb 14.7 oz)   SpO2 100%   BMI 15.63 kg/m   General: Frail female in no distress sitting in bedside chair Cardiovascular: RRR, S1/S2 +, no rubs, no gallops Respiratory: CTA bilaterally, no wheezing, no rhonchi Abdominal: Abdomen soft, non-tender, non-distended, with normoactive bowel sounds. No organomegaly or masses felt. Extremities: No edema, no cyanosis  Labs: Basic Metabolic Panel:  Recent Labs Lab 11/15/16 1647 11/15/16 2119 11/16/16 0731 11/17/16 0501  NA 139  --  137 139  K 2.9*  --  3.8 3.4*  CL 109  --  111 112*  CO2 20*  --  20* 21*  GLUCOSE 103*  --  74 99  BUN 27*  --  22* 19  CREATININE 1.01*  --  0.81 0.78  CALCIUM 8.2*  --  7.9* 7.8*  MG  --   --  1.8  --   PHOS  --  1.9* 2.4*  --    Liver Function Tests:  Recent Labs Lab 11/15/16 1647 11/16/16 0731 11/17/16 0501  AST 58* 60* 60*  ALT 37 35 39  ALKPHOS 455* 427* 445*  BILITOT 6.9* 7.6* 8.0*  8.0*  PROT 5.6* 5.4* 5.0*  ALBUMIN 2.0* 1.8* 1.7*    Recent Labs Lab 11/15/16 2045  AMMONIA 74*   CBC:  Recent Labs Lab 11/15/16 1647 11/16/16 0731  WBC 8.1 8.1  NEUTROABS 6.2  --   HGB 10.3* 9.3*  HCT 30.1* 27.5*  MCV 81.1 82.3  PLT 230 179   Thyroid function studies  Recent Labs  11/16/16 0910  TSH 1.859   Urinalysis    Component Value Date/Time   COLORURINE AMBER (A) 11/15/2016 1603   APPEARANCEUR CLEAR 11/15/2016 1603   LABSPEC 1.020 11/15/2016 1603   LABSPEC 1.025 11/10/2016 1010   PHURINE 5.0 11/15/2016 1603   GLUCOSEU NEGATIVE 11/15/2016 1603   GLUCOSEU Negative 11/10/2016 1010   HGBUR SMALL (A) 11/15/2016 1603   BILIRUBINUR MODERATE (A) 11/15/2016 1603   BILIRUBINUR Negative 11/10/2016 1010   KETONESUR NEGATIVE 11/15/2016 1603   PROTEINUR 30 (A) 11/15/2016 1603   UROBILINOGEN 0.2 11/10/2016 1010   NITRITE NEGATIVE 11/15/2016 1603   LEUKOCYTESUR NEGATIVE 11/15/2016  1603   LEUKOCYTESUR Negative 11/10/2016 1010   Microbiology Recent Results (from the past 240 hour(s))  Urine Culture     Status: Abnormal   Collection Time: 11/10/16 10:10 AM  Result Value Ref Range Status   Urine Culture, Routine Final report (A)  Final   Urine Culture result 1 Escherichia coli (A)  Final    Comment: Greater than 100,000 colony forming units per mL Cefazolin <=4 ug/mL Cefazolin with an MIC <=16 predicts susceptibility to the oral agents cefaclor, cefdinir, cefpodoxime, cefprozil, cefuroxime, cephalexin, and loracarbef when used for therapy of uncomplicated urinary tract infections due to E. coli, Klebsiella pneumoniae, and Proteus mirabilis.    ANTIMICROBIAL SUSCEPTIBILITY Comment  Final    Comment:       ** S = Susceptible; I = Intermediate; R = Resistant **  P = Positive; N = Negative             MICS are expressed in micrograms per mL    Antibiotic                 RSLT#1    RSLT#2    RSLT#3    RSLT#4 Amoxicillin/Clavulanic Acid    S Ampicillin                     R Cefepime                       S Ceftriaxone                    S Cefuroxime                     S Cephalothin                    S Ciprofloxacin                  S Ertapenem                      S Gentamicin                     S Imipenem                       S Levofloxacin                   S Nitrofurantoin                 S Piperacillin                   R Tetracycline                   S Tobramycin                     S Trimethoprim/Sulfa             S   Urine culture     Status: None   Collection Time: 11/15/16  4:03 PM  Result Value Ref Range Status   Specimen Description URINE, CATHETERIZED  Final   Special Requests Normal  Final   Culture   Final    NO GROWTH Performed at [facility], [place] [address], [place], [place] [address]    Report Status 11/16/2016 FINAL  Final  Culture, blood (routine x 2)     Status: None (Preliminary result)   Collection  Time: 11/15/16  5:01 PM  Result Value Ref Range Status   Specimen Description BLOOD PORTA CATH  Final   Special Requests   Final    BOTTLES DRAWN AEROBIC AND ANAEROBIC Blood Culture adequate volume   Culture   Final    NO GROWTH 1 DAY Performed at [facility], [place] [address] [place],  [address]    Report Status PENDING  Incomplete  Culture, blood (routine x 2)     Status: None (Preliminary result)   Collection Time: 11/15/16  5:16 PM  Result Value Ref Range Status   Specimen Description BLOOD LEFT ANTECUBITAL  Final   Special Requests   Final    BOTTLES DRAWN AEROBIC AND ANAEROBIC Blood Culture adequate volume   Culture  Final    NO GROWTH 1 DAY Performed at [facility], [place] [address], [place], [place] [address]    Report Status PENDING  Incomplete    Time coordinating discharge: Approximately 40 minutes  Vance Gather, MD  [facility] 11/17/2016, 4:59 PM Pager [phone]

## 2016-11-17 NOTE — Progress Notes (Signed)
Occupational Therapy Treatment Patient Details Name: Lisa Guerra MRN: [id number] DOB: Feb 24, 1946 Today's Date: 11/17/2016    History of present illness  71 y.o. female with medical history significant of metastatic colon cancer with metastases to the liver iron deficiency anemia, CKD, HTN hyperlipidemia admitted with debility, dehydration, elevated LFT and lactic acidosis. Recent admission 3/22-3/24 for AMS related to elevated LFT.    OT comments  Pt progressing towards goals, though requiring increased assist for functional mobility transfers this session. Completed seated/standing grooming ADLs throughout session. Education provided and questions answered. Continue per POC.    Follow Up Recommendations  No OT follow up;Supervision/Assistance - 24 hour    Equipment Recommendations  3 in 1 bedside commode          Precautions / Restrictions Precautions Precautions: Fall Restrictions Weight Bearing Restrictions: No       Mobility Bed Mobility Overal bed mobility: Needs Assistance Bed Mobility: Supine to Sit     Supine to sit: HOB elevated;Min assist     General bed mobility comments: assist for trunk  Transfers Overall transfer level: Needs assistance Equipment used: Rolling walker (2 wheeled) Transfers: Sit to/from Stand Sit to Stand: Min assist;Mod assist         General transfer comment: verbal cues for hand placement     Balance Overall balance assessment: Needs assistance Sitting-balance support: Feet supported;Bilateral upper extremity supported Sitting balance-Leahy Scale: Good     Standing balance support: Single extremity supported;During functional activity Standing balance-Leahy Scale: Poor Standing balance comment: requires UE support to maintain standing balance                            ADL either performed or assessed with clinical judgement   ADL Overall ADL's : Needs assistance/impaired Eating/Feeding:  Independent;Sitting   Grooming: Wash/dry hands;Wash/dry face;Oral care;Min guard;Standing                   Toilet Transfer: Moderate assistance;Ambulation;Comfort height toilet;Grab bars;RW Armed forces technical officer Details (indicate cue type and reason): increased assist for sit to/from stand transfer after toileting  Toileting- Clothing Manipulation and Hygiene: Minimal assistance;Sit to/from stand Toileting - Clothing Manipulation Details (indicate cue type and reason): for clothing manipulation     Functional mobility during ADLs: Min guard;Rolling walker General ADL Comments: Completed room level functional mobility, standing grooming ADLs, toileting                       Cognition Arousal/Alertness: Awake/alert Behavior During Therapy: WFL for tasks assessed/performed Overall Cognitive Status: Within Functional Limits for tasks assessed                                                      General Comments      Pertinent Vitals/ Pain       Pain Assessment: No/denies pain                                                          Frequency  Min 2X/week        Progress Toward Goals  OT Goals(current goals  can now be found in the care plan section)  Progress towards OT goals: Progressing toward goals  Acute Rehab OT Goals Patient Stated Goal: likes to watch tv, sit outside, crochet OT Goal Formulation: With patient Time For Goal Achievement: 11/23/16 Potential to Achieve Goals: Good  Plan Discharge plan remains appropriate                     End of Session Equipment Utilized During Treatment: Gait belt;Rolling walker  OT Visit Diagnosis: Muscle weakness (generalized) (M62.81)   Activity Tolerance Patient tolerated treatment well   Patient Left in chair;with call bell/phone within reach;with chair alarm set         Functional Assessment Tool Used: Clinical judgement Functional Limitation: Self  care Self Care Current Status (F9012): At least 40 percent but less than 60 percent impaired, limited or restricted Self Care Goal Status (Q2411): At least 20 percent but less than 40 percent impaired, limited or restricted   Time: 1123-1145 OT Time Calculation (min): 22 min  Charges: OT G-codes **NOT FOR INPATIENT CLASS** Functional Assessment Tool Used: Clinical judgement Functional Limitation: Self care Self Care Current Status (O6431): At least 40 percent but less than 60 percent impaired, limited or restricted Self Care Goal Status (U2767): At least 20 percent but less than 40 percent impaired, limited or restricted OT General Charges $OT Visit: 1 Procedure OT Treatments $Self Care/Home Management : 8-22 mins  Lisa Guerra, [place] Pager [phone] 11/17/2016    Lisa Guerra 11/17/2016, 1:12 PM

## 2016-11-20 ENCOUNTER — Telehealth: Payer: Self-pay | Admitting: *Deleted

## 2016-11-20 NOTE — Telephone Encounter (Signed)
Message from [place], [place] with [facility] reporting pt's son is interested in hospice evaluation. Dr. Benay Spice made aware. Next office visit 11/24/16.

## 2016-11-21 LAB — CULTURE, BLOOD (ROUTINE X 2)
CULTURE: NO GROWTH
Culture: NO GROWTH
SPECIAL REQUESTS: ADEQUATE
Special Requests: ADEQUATE

## 2016-11-24 ENCOUNTER — Ambulatory Visit: Payer: Medicare Other

## 2016-11-24 ENCOUNTER — Ambulatory Visit (HOSPITAL_BASED_OUTPATIENT_CLINIC_OR_DEPARTMENT_OTHER): Payer: Medicare Other

## 2016-11-24 ENCOUNTER — Other Ambulatory Visit (HOSPITAL_BASED_OUTPATIENT_CLINIC_OR_DEPARTMENT_OTHER): Payer: Medicare Other

## 2016-11-24 ENCOUNTER — Ambulatory Visit (HOSPITAL_BASED_OUTPATIENT_CLINIC_OR_DEPARTMENT_OTHER): Payer: Medicare Other | Admitting: Nurse Practitioner

## 2016-11-24 ENCOUNTER — Ambulatory Visit: Payer: Medicare Other | Admitting: Nurse Practitioner

## 2016-11-24 ENCOUNTER — Other Ambulatory Visit: Payer: Medicare Other

## 2016-11-24 VITALS — BP 71/55 | HR 99 | Temp 97.7°F | Resp 20

## 2016-11-24 DIAGNOSIS — C7800 Secondary malignant neoplasm of unspecified lung: Secondary | ICD-10-CM | POA: Diagnosis not present

## 2016-11-24 DIAGNOSIS — C189 Malignant neoplasm of colon, unspecified: Secondary | ICD-10-CM

## 2016-11-24 DIAGNOSIS — D630 Anemia in neoplastic disease: Secondary | ICD-10-CM | POA: Diagnosis not present

## 2016-11-24 DIAGNOSIS — C180 Malignant neoplasm of cecum: Secondary | ICD-10-CM

## 2016-11-24 DIAGNOSIS — C787 Secondary malignant neoplasm of liver and intrahepatic bile duct: Secondary | ICD-10-CM | POA: Diagnosis not present

## 2016-11-24 DIAGNOSIS — E860 Dehydration: Secondary | ICD-10-CM

## 2016-11-24 DIAGNOSIS — N189 Chronic kidney disease, unspecified: Secondary | ICD-10-CM | POA: Diagnosis not present

## 2016-11-24 DIAGNOSIS — Z452 Encounter for adjustment and management of vascular access device: Secondary | ICD-10-CM | POA: Diagnosis not present

## 2016-11-24 DIAGNOSIS — C799 Secondary malignant neoplasm of unspecified site: Secondary | ICD-10-CM

## 2016-11-24 DIAGNOSIS — Z95828 Presence of other vascular implants and grafts: Secondary | ICD-10-CM

## 2016-11-24 LAB — COMPREHENSIVE METABOLIC PANEL
ALT: 78 U/L — AB (ref 0–55)
AST: 153 U/L — AB (ref 5–34)
Albumin: 1.5 g/dL — ABNORMAL LOW (ref 3.5–5.0)
Alkaline Phosphatase: 905 U/L — ABNORMAL HIGH (ref 40–150)
Anion Gap: 13 mEq/L — ABNORMAL HIGH (ref 3–11)
BUN: 46.6 mg/dL — AB (ref 7.0–26.0)
CALCIUM: 8.3 mg/dL — AB (ref 8.4–10.4)
CHLORIDE: 111 meq/L — AB (ref 98–109)
CO2: 15 meq/L — AB (ref 22–29)
CREATININE: 2.8 mg/dL — AB (ref 0.6–1.1)
EGFR: 19 mL/min/{1.73_m2} — ABNORMAL LOW (ref >90)
GLUCOSE: 58 mg/dL — AB (ref 70–140)
Potassium: 4.6 mEq/L (ref 3.5–5.1)
SODIUM: 139 meq/L (ref 136–145)
Total Bilirubin: 14.92 mg/dL (ref 0.20–1.20)
Total Protein: 5.7 g/dL — ABNORMAL LOW (ref 6.4–8.3)

## 2016-11-24 LAB — CBC WITH DIFFERENTIAL/PLATELET
BASO%: 0 % (ref 0.0–2.0)
Basophils Absolute: 0 10*3/uL (ref 0.0–0.1)
EOS%: 0 % (ref 0.0–7.0)
Eosinophils Absolute: 0 10*3/uL (ref 0.0–0.5)
HEMATOCRIT: 26.5 % — AB (ref 34.8–46.6)
HGB: 8.9 g/dL — ABNORMAL LOW (ref 11.6–15.9)
LYMPH#: 0.9 10*3/uL (ref 0.9–3.3)
LYMPH%: 4.3 % — ABNORMAL LOW (ref 14.0–49.7)
MCH: 27.7 pg (ref 25.1–34.0)
MCHC: 33.6 g/dL (ref 31.5–36.0)
MCV: 82.6 fL (ref 79.5–101.0)
MONO#: 0.8 10*3/uL (ref 0.1–0.9)
MONO%: 3.9 % (ref 0.0–14.0)
NEUT#: 19.1 10*3/uL — ABNORMAL HIGH (ref 1.5–6.5)
NEUT%: 91.8 % — ABNORMAL HIGH (ref 38.4–76.8)
Platelets: 259 10*3/uL (ref 145–400)
RBC: 3.21 10*6/uL — AB (ref 3.70–5.45)
RDW: 21.5 % — ABNORMAL HIGH (ref 11.2–14.5)
WBC: 20.8 10*3/uL — ABNORMAL HIGH (ref 3.9–10.3)

## 2016-11-24 LAB — CEA (IN HOUSE-CHCC): CEA (CHCC-In House): 1833.56 ng/mL — ABNORMAL HIGH (ref 0.00–5.00)

## 2016-11-24 MED ORDER — SODIUM CHLORIDE 0.9% FLUSH
10.0000 mL | INTRAVENOUS | Status: DC | PRN
  Administered 2016-11-24: 10 mL via INTRAVENOUS
  Filled 2016-11-24: qty 10

## 2016-11-24 MED ORDER — PREDNISONE 10 MG PO TABS: 10.0000 mg | ORAL_TABLET | Freq: Every day | ORAL | 2 refills | Status: AC

## 2016-11-24 MED ORDER — HEPARIN SOD (PORK) LOCK FLUSH 100 UNIT/ML IV SOLN
500.0000 [IU] | Freq: Once | INTRAVENOUS | Status: AC | PRN
  Administered 2016-11-24: 500 [IU] via INTRAVENOUS
  Filled 2016-11-24: qty 5

## 2016-11-24 MED ORDER — SODIUM CHLORIDE 0.9 % IV SOLN
Freq: Once | INTRAVENOUS | Status: AC
  Administered 2016-11-24: 12:00:00 via INTRAVENOUS

## 2016-11-24 NOTE — Progress Notes (Addendum)
[place] OFFICE PROGRESS NOTE   Diagnosis:  Colon cancer  INTERVAL HISTORY:   Lisa Guerra returns for follow-up. She completed cycle 3 FOLFOX 10/27/2016. Cycle 4 was held on 11/10/2016 due to failure to thrive. She was hospitalized 11/15/2016 with weakness/generalized fatigue. CT scans 11/16/2016 showed overall tumor burden stable; new left pleural effusion and enlargement of a right adrenal lesion; some degree of biliary obstruction. The bilirubin was higher. She was discharged home 11/17/2016.  She has become weaker since discharge from the hospital. Oral intake is poor. She denies pain at present. Earlier in the week her daughter-in-law reports she complained of abdominal pain. She has oxycodone to take as needed. No fever or chills. Legs are swollen. She also notes painless swelling of the left knee.  Objective:  Vital signs in last 24 hours:  Blood pressure (!) 71/55, pulse 99, temperature 97.7 F (36.5 C), temperature source Oral, resp. rate 20, SpO2 100 %.    HEENT: White coating over tongue. No buccal thrush. Marked scleral icterus. Resp: Lungs clear bilaterally. Cardio: Regular rate and rhythm. GI: Abdomen soft and nontender. No hepatomegaly. Vascular: Pitting edema below the knees bilaterally. Musculoskeletal: Small left knee effusion. Skin:Skin is jaundiced.  Port-A-Cath without erythema.  Lab Results:  Lab Results  Component Value Date   WBC 20.8 (H) 11/24/2016   HGB 8.9 (L) 11/24/2016   HCT 26.5 (L) 11/24/2016   MCV 82.6 11/24/2016   PLT 259 11/24/2016   NEUTROABS 19.1 (H) 11/24/2016    Imaging:  No results found.  Medications: I have reviewed the patient's current medications.  Assessment/Plan: 1. Metastatic colon cancer-adenocarcinoma on biopsy of a cecal mass 09/07/2016  Separate polypoid mass at the sigmoid colon, biopsy to 15 2018 revealed a tubular adenoma with at least high-grade dysplasia  Staging CTs of the chest, abdomen,  and pelvis 09/08/2016-extensive liver and lung metastases, bulky abdominal/retroperitoneal lymphadenopathy  Daryel Gerald elevated CEA  Ultrasound-guided biopsy of a liver lesion 09/25/2016 confirmed metastatic colon cancer, Foundation 1 testing-KRAS G13Dpositive, MSI-stable, intermediate tumor burden, BRAF negative  Cycle 1 FOLFOX 09/29/2016  Cycle 2 FOLFOX 10/13/2016  Cycle 3 FOLFOX 10/27/2016-leucovorin added   CT 11/16/2016-stable hepatic lesions, hepatic flexure mass, and mild intrahepatic biliary dilatation. Increased right adrenal mass, new small left pleural effusion.  2. Iron deficiency anemia secondary to #1  Red cell transfusion 09/24/2016   IV iron 09/27/2016  2 units of packed red blood cells 10/16/2016  3. Chronic renal insufficiency  4. Polyps noted on the colonoscopy 09/07/2016 including a polyp with high-grade dysplasia  5. Hypertension  6. Family history of multiple cancers including pancreas cancer  7. Lipidemia  8. Hyperbilirubinemia secondary to extensive metastatic disease involving the liver  CT abdomen 09/29/2016 with stable mild intrahepatic biliary dilatation.  Bilirubin improved 10/06/2016, 10/13/2016  9. Bilateral knee effusions left greater than right, erythema/tenderness overlying the right patella03/20/2018-improved  10. Admission with altered mental status 10/15/2016; altered mental status following cycle 3 FOLFOX, etiology unclear  11. Urinary tract infection 11/10/2016  12. Admission 11/15/2016 with failure to thrive    Disposition: Ms. Colasurdo performance status continues to decline. The bilirubin is higher. She and her family understand she is not a candidate for additional chemotherapy. We discussed a supportive/comfort care approach with hospice involvement. Ms. Bale and her family are in agreement. We will make a referral to the [facility] program. We discussed end-of-life issues/CPR. She  will be placed on NO CODE BLUE status.  She will begin a trial of  prednisone 10 mg daily for appetite and energy.   She appears dehydrated. She will receive a liter of IV fluids in the office today.  She will return for a follow-up visit in 2 weeks.  Patient seen with Dr. Benay Spice. 25 minutes were spent face-to-face at today's visit with the majority of that time involved in counseling/coordination of care.    Ned Card ANP/GNP-BC   11/24/2016  11:11 AM  This was a shared visit with Ned Card. Ms. Hickmon has a poor performance status. She does not appear to be a candidate for additional systemic therapy. I discussed hospice care with Ms. Sann and her family. She agrees to a [facility] hospice referral.  Julieanne Manson, M.D.

## 2016-11-24 NOTE — Patient Instructions (Signed)
Dehydration, Adult Dehydration is when there is not enough fluid or water in your body. This happens when you lose more fluids than you take in. Dehydration can range from mild to very bad. It should be treated right away to keep it from getting very bad. Symptoms of mild dehydration may include:   Thirst.  Dry lips.  Slightly dry mouth.  Dry, warm skin.  Dizziness. Symptoms of moderate dehydration may include:   Very dry mouth.  Muscle cramps.  Dark pee (urine). Pee may be the color of tea.  Your body making less pee.  Your eyes making fewer tears.  Heartbeat that is uneven or faster than normal (palpitations).  Headache.  Light-headedness, especially when you stand up from sitting.  Fainting (syncope). Symptoms of very bad dehydration may include:   Changes in skin, such as:  Cold and clammy skin.  Blotchy (mottled) or pale skin.  Skin that does not quickly return to normal after being lightly pinched and let go (poor skin turgor).  Changes in body fluids, such as:  Feeling very thirsty.  Your eyes making fewer tears.  Not sweating when body temperature is high, such as in hot weather.  Your body making very little pee.  Changes in vital signs, such as:  Weak pulse.  Pulse that is more than 100 beats a minute when you are sitting still.  Fast breathing.  Low blood pressure.  Other changes, such as:  Sunken eyes.  Cold hands and feet.  Confusion.  Lack of energy (lethargy).  Trouble waking up from sleep.  Short-term weight loss.  Unconsciousness. Follow these instructions at home:  If told by your doctor, drink an ORS:  Make an ORS by using instructions on the package.  Start by drinking small amounts, about  cup (120 mL) every 5-10 minutes.  Slowly drink more until you have had the amount that your doctor said to have.  Drink enough clear fluid to keep your pee clear or pale yellow. If you were told to drink an ORS, finish the  ORS first, then start slowly drinking clear fluids. Drink fluids such as:  Water. Do not drink only water by itself. Doing that can make the salt (sodium) level in your body get too low (hyponatremia).  Ice chips.  Fruit juice that you have added water to (diluted).  Low-calorie sports drinks.  Avoid:  Alcohol.  Drinks that have a lot of sugar. These include high-calorie sports drinks, fruit juice that does not have water added, and soda.  Caffeine.  Foods that are greasy or have a lot of fat or sugar.  Take over-the-counter and prescription medicines only as told by your doctor.  Do not take salt tablets. Doing that can make the salt level in your body get too high (hypernatremia).  Eat foods that have minerals (electrolytes). Examples include bananas, oranges, potatoes, tomatoes, and spinach.  Keep all follow-up visits as told by your doctor. This is important. Contact a doctor if:  You have belly (abdominal) pain that:  Gets worse.  Stays in one area (localizes).  You have a rash.  You have a stiff neck.  You get angry or annoyed more easily than normal (irritability).  You are more sleepy than normal.  You have a harder time waking up than normal.  You feel:  Weak.  Dizzy.  Very thirsty.  You have peed (urinated) only a small amount of very dark pee during 6-8 hours. Get help right away if:  You   have symptoms of very bad dehydration.  You cannot drink fluids without throwing up (vomiting).  Your symptoms get worse with treatment.  You have a fever.  You have a very bad headache.  You are throwing up or having watery poop (diarrhea) and it:  Gets worse.  Does not go away.  You have blood or something green (bile) in your throw-up.  You have blood in your poop (stool). This may cause poop to look black and tarry.  You have not peed in 6-8 hours.  You pass out (faint).  Your heart rate when you are sitting still is more than 100 beats a  minute.  You have trouble breathing. This information is not intended to replace advice given to you by your health care provider. Make sure you discuss any questions you have with your health care provider. Document Released: 05/09/2009 Document Revised: 01/31/2016 Document Reviewed: 09/06/2015 Elsevier Interactive Patient Education  2017 [employer].  

## 2016-11-24 NOTE — Patient Instructions (Signed)
Implanted Port Home Guide An implanted port is a type of central line that is placed under the skin. Central lines are used to provide IV access when treatment or nutrition needs to be given through a person's veins. Implanted ports are used for long-term IV access. An implanted port may be placed because:  You need IV medicine that would be irritating to the small veins in your hands or arms.  You need long-term IV medicines, such as antibiotics.  You need IV nutrition for a long period.  You need frequent blood draws for lab tests.  You need dialysis.  Implanted ports are usually placed in the chest area, but they can also be placed in the upper arm, the abdomen, or the leg. An implanted port has two main parts:  Reservoir. The reservoir is round and will appear as a small, raised area under your skin. The reservoir is the part where a needle is inserted to give medicines or draw blood.  Catheter. The catheter is a thin, flexible tube that extends from the reservoir. The catheter is placed into a large vein. Medicine that is inserted into the reservoir goes into the catheter and then into the vein.  How will I care for my incision site? Do not get the incision site wet. Bathe or shower as directed by your health care provider. How is my port accessed? Special steps must be taken to access the port:  Before the port is accessed, a numbing cream can be placed on the skin. This helps numb the skin over the port site.  Your health care provider uses a sterile technique to access the port. ? Your health care provider must put on a mask and sterile gloves. ? The skin over your port is cleaned carefully with an antiseptic and allowed to dry. ? The port is gently pinched between sterile gloves, and a needle is inserted into the port.  Only "non-coring" port needles should be used to access the port. Once the port is accessed, a blood return should be checked. This helps ensure that the port  is in the vein and is not clogged.  If your port needs to remain accessed for a constant infusion, a clear (transparent) bandage will be placed over the needle site. The bandage and needle will need to be changed every week, or as directed by your health care provider.  Keep the bandage covering the needle clean and dry. Do not get it wet. Follow your health care provider's instructions on how to take a shower or bath while the port is accessed.  If your port does not need to stay accessed, no bandage is needed over the port.  What is flushing? Flushing helps keep the port from getting clogged. Follow your health care provider's instructions on how and when to flush the port. Ports are usually flushed with saline solution or a medicine called heparin. The need for flushing will depend on how the port is used.  If the port is used for intermittent medicines or blood draws, the port will need to be flushed: ? After medicines have been given. ? After blood has been drawn. ? As part of routine maintenance.  If a constant infusion is running, the port may not need to be flushed.  How long will my port stay implanted? The port can stay in for as long as your health care provider thinks it is needed. When it is time for the port to come out, surgery will be   done to remove it. The procedure is similar to the one performed when the port was put in. When should I seek immediate medical care? When you have an implanted port, you should seek immediate medical care if:  You notice a bad smell coming from the incision site.  You have swelling, redness, or drainage at the incision site.  You have more swelling or pain at the port site or the surrounding area.  You have a fever that is not controlled with medicine.  This information is not intended to replace advice given to you by your health care provider. Make sure you discuss any questions you have with your health care provider. Document  Released: 07/13/2005 Document Revised: 12/19/2015 Document Reviewed: 03/20/2013 Elsevier Interactive Patient Education  2017 Elsevier Inc.  

## 2016-11-25 ENCOUNTER — Encounter: Payer: Self-pay | Admitting: *Deleted

## 2016-11-25 NOTE — Progress Notes (Signed)
Patient's daughter-in- law Lisa Guerra called regarding Hospice consult. Per Lisa Haw, NP notify [facility] for a consult to take place this week. Notified [facility] and spoke with Lisa Oats, RN, for a referral for this patient to take place this week. Lisa Oats, RN stated will notify daughter-in-law today, and patient will be seen tomorrow.  Patient daughter-in-law Lisa Guerra was notified that Hospice would contact her today, understanding verbalized.

## 2016-11-26 ENCOUNTER — Telehealth: Payer: Self-pay | Admitting: *Deleted

## 2016-11-26 DIAGNOSIS — C182 Malignant neoplasm of ascending colon: Secondary | ICD-10-CM

## 2016-11-26 MED ORDER — LACTULOSE 10 GM/15ML PO SOLN: 20.0000 g | Freq: Two times a day (BID) | ORAL | 0 refills | Status: AC

## 2016-11-26 NOTE — Telephone Encounter (Signed)
Call from Hall Busing, Hospice RN requesting refill of Lactulose to be sent to [facility]. Also asking if OK for hospice MD to sign DNR. YES, per Dr. Benay Spice.

## 2016-11-27 ENCOUNTER — Telehealth: Payer: Self-pay | Admitting: Oncology

## 2016-11-27 NOTE — Telephone Encounter (Signed)
Patient bypassed scheduling on 11/24/16.  Appointments scheduled per 11/24/16 los. Patient was mailed a copy of the appointment schedule and appointment letter.

## 2016-11-30 ENCOUNTER — Telehealth: Payer: Self-pay | Admitting: Oncology

## 2016-11-30 NOTE — Telephone Encounter (Signed)
Bonnie from hospice called to inform that [name] passed away on Dec 29, 2016 at 5:45pm @home 

## 2016-12-08 ENCOUNTER — Ambulatory Visit: Payer: Medicare Other | Admitting: Oncology

## 2016-12-25 DEATH — deceased

## 2016-12-28 NOTE — Anesthesia Postprocedure Evaluation (Signed)
Anesthesia Post Note  Patient: Lisa Guerra  Procedure(s) Performed: Procedure(s) (LRB): COLONOSCOPY (N/A)     Anesthesia Post Evaluation  Last Vitals:  Vitals:   09/07/16 0907 09/07/16 1030  BP: 105/63 109/80  Pulse: 77 75  Resp: 12 (!) 22  Temp: 36.6 C 36.4 C    Last Pain:  Vitals:   09/08/16 1101  TempSrc:   PainSc: 0-No pain                 Denisse Whitenack S

## 2016-12-28 NOTE — Addendum Note (Signed)
Addendum  created 12/28/16 1106 by Myrtie Soman, MD   Sign clinical note

## 2017-01-30 ENCOUNTER — Other Ambulatory Visit: Payer: Self-pay | Admitting: Nurse Practitioner

## 2018-05-07 IMAGING — CT CT ABD-PELV W/ CM
2 of 5 series · 11 of 36 positions shown, 13 images · IV contrast (APPLIED)
Comparison: None.

CLINICAL DATA: Anemia.  Abnormal colonoscopy.

EXAM:
CT CHEST, ABDOMEN, AND PELVIS WITH CONTRAST
TECHNIQUE: Multidetector CT imaging of the chest, abdomen and pelvis was
performed following the standard protocol during bolus
administration of intravenous contrast.
CONTRAST:  80mL [id number] IOPAMIDOL ([id number]) INJECTION 61%

[Series 2: cap with · axial · 0.70mm/px · z∈[-534,-58]mm · 8 of 123 slices shown, 10 images]
[im 14/123  mediastinal]
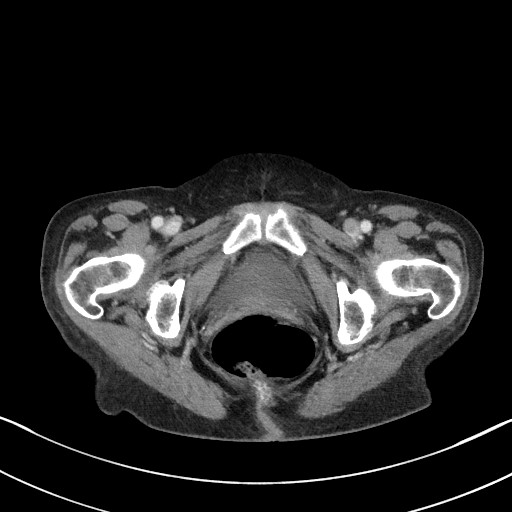
[im 14/123  lung]
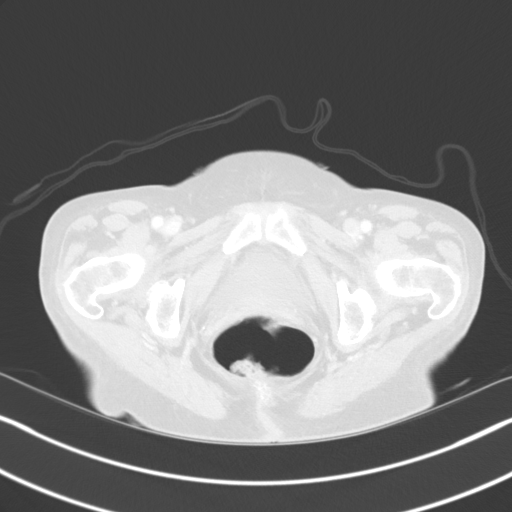
[im 28/123  lung]
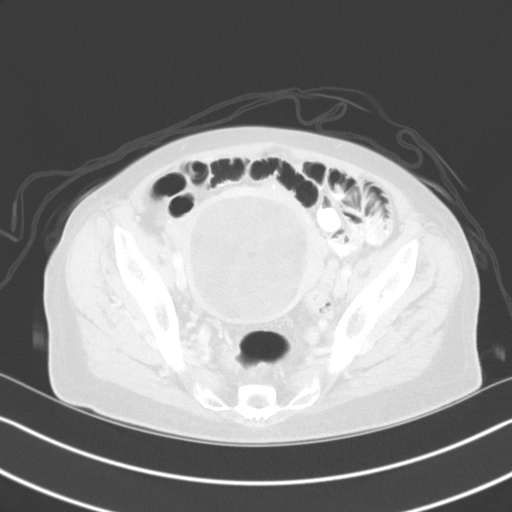
[im 41/123  lung]
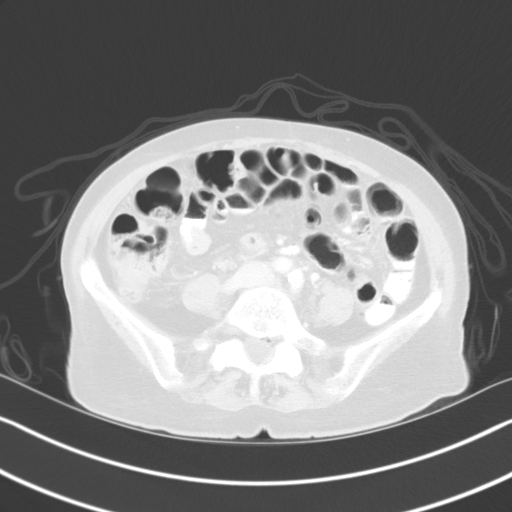
[im 55/123  lung]
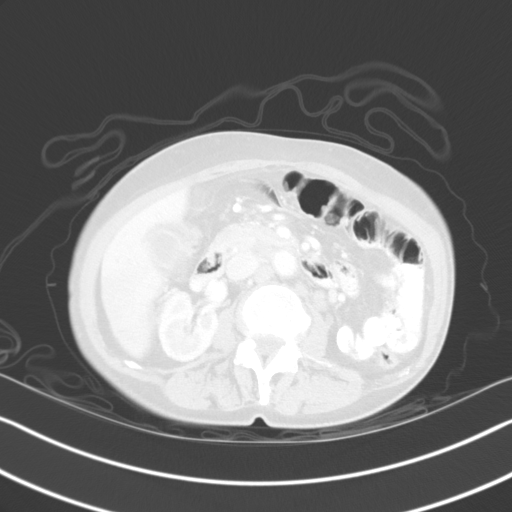
[im 68/123  mediastinal]
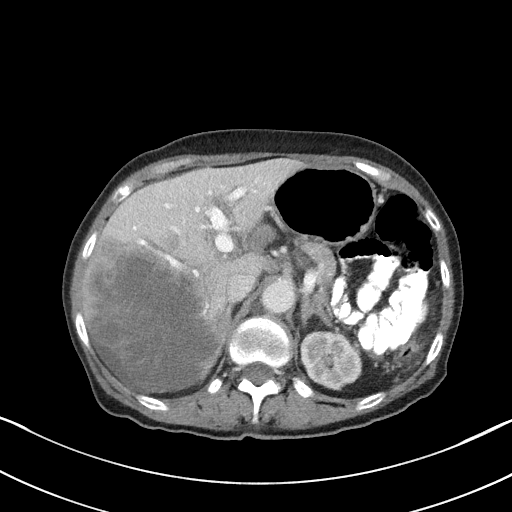
[im 68/123  lung]
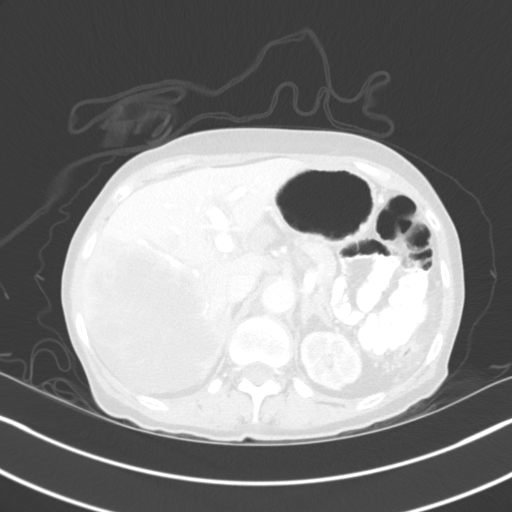
[im 82/123  lung]
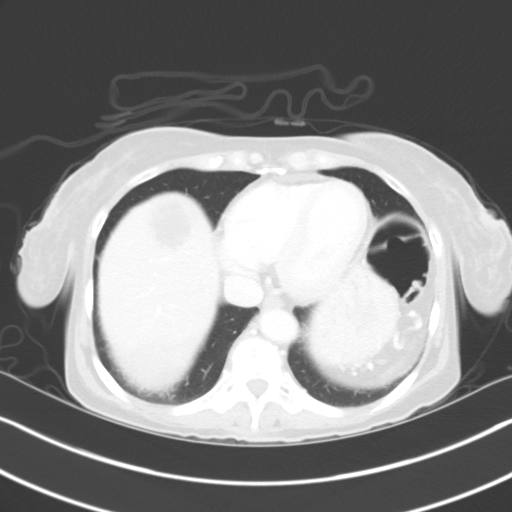
[im 95/123  lung]
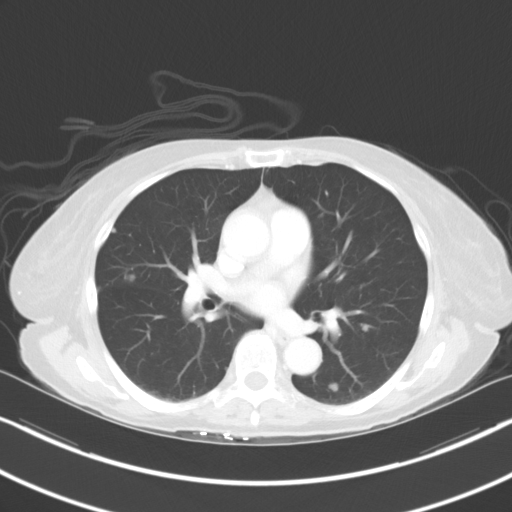
[im 109/123  lung]
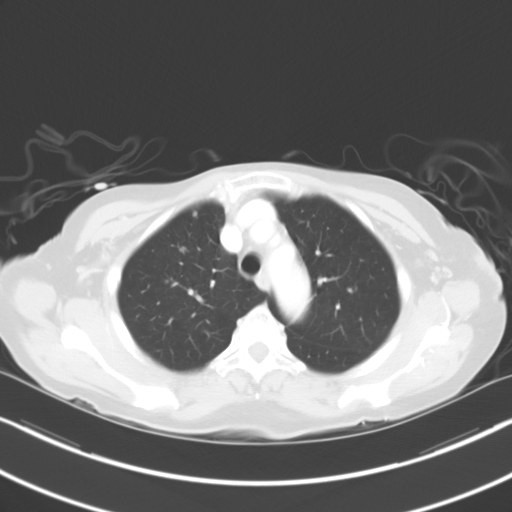

[Series 5: coronals · coronal · 0.65mm/px · 3 of 136 slices shown]
[im 28/136  lung]
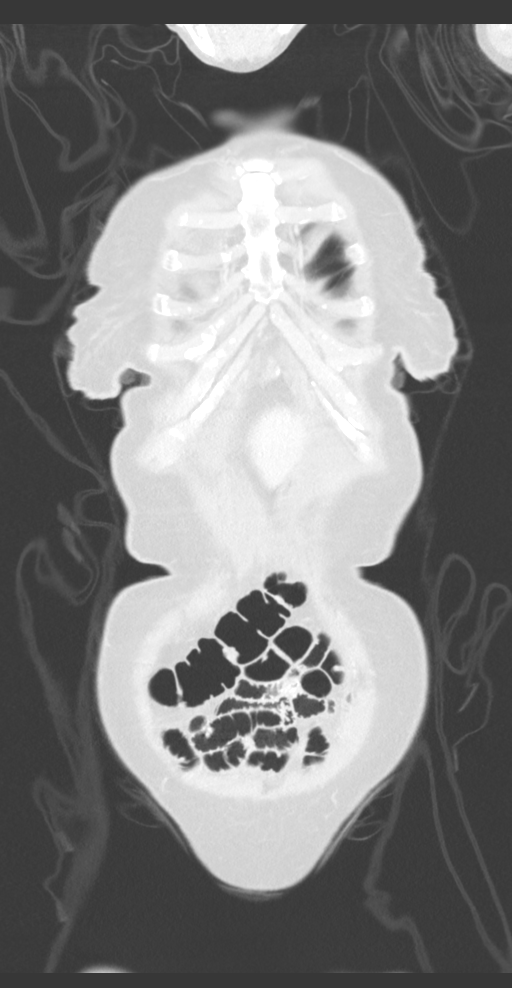
[im 55/136  lung]
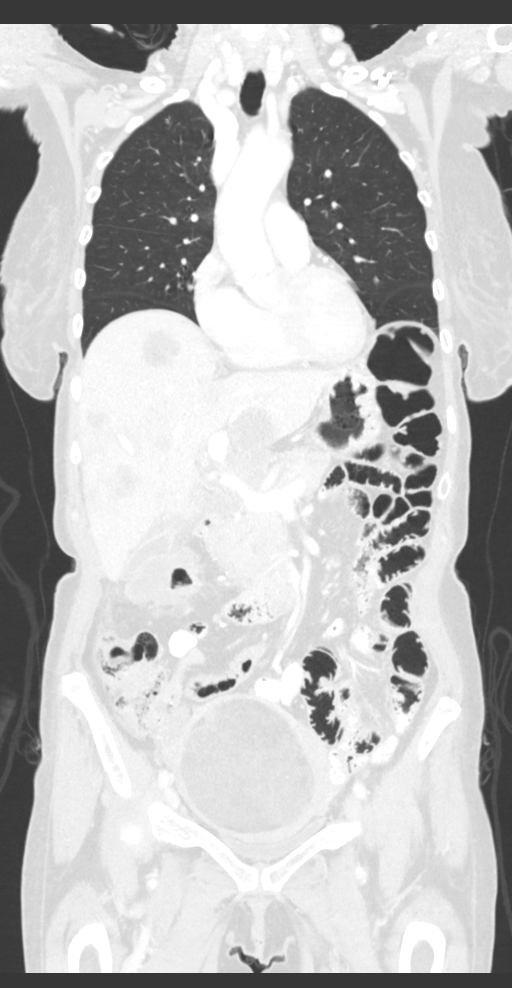
[im 82/136  lung]
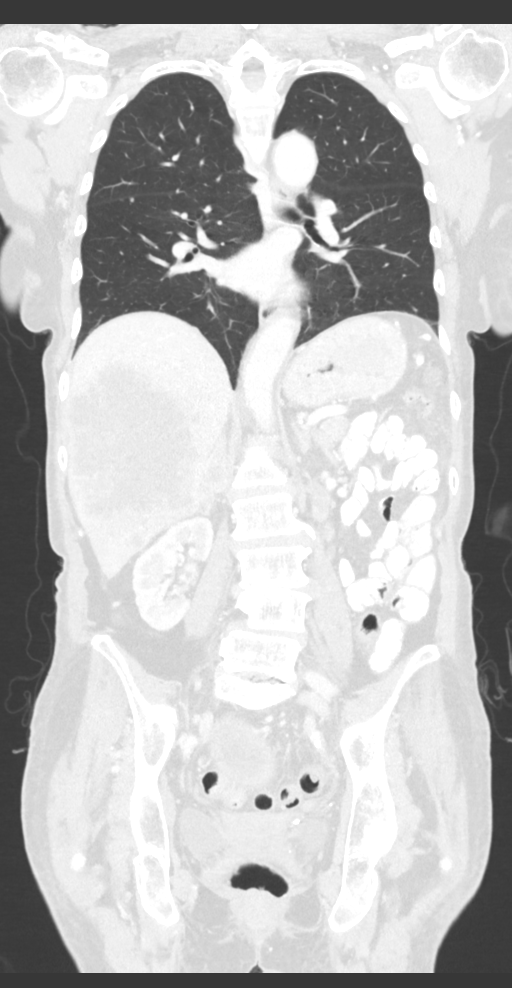

[11 of 36 positions shown; findings below may reference images not displayed]

FINDINGS: CT CHEST FINDINGS

Chest wall: No breast masses are identified. No supraclavicular or
axillary lymphadenopathy. Small scattered lymph nodes are noted. The
thyroid gland is normal.

Cardiovascular: The heart is normal in size. No pericardial
effusion. The aorta is normal in caliber. No dissection. The branch
vessels are patent. Minimal scattered coronary artery
calcifications. The pulmonary arteries appear normal.

Mediastinum/Nodes: No mediastinal or hilar mass or adenopathy. The
esophagus is normal.

Lungs/Pleura: Numerous small bilateral pulmonary nodules consistent
with metastatic disease. A few benign calcified granulomas are also
noted. No acute pulmonary findings. No pleural effusion.

Musculoskeletal: No significant bony findings. No lytic or sclerotic
bone lesions to suggest osseous metastatic disease.

CT ABDOMEN PELVIS FINDINGS

Hepatobiliary: Diffuse hepatic metastatic disease. Index lesion at
the hepatic dome on image number 43 measures 3.3 x 3.1 cm. Large
conglomerate of masses occupying the right hepatic lobe on image
number 54 measures 11.5 x 9.2 cm. Segment 3 lesion on image number
53 measures 2.9 x 2.2 cm. The gallbladder is grossly normal. No
common bile duct dilatation.

Pancreas: No pancreatic mass, inflammation or ductal dilatation but
the pancreas is surrounded by bulky lymphadenopathy.

Spleen: Normal size.  No focal lesions.

Adrenals/Urinary Tract: The adrenal glands is thickened and there is
a right adrenal gland lesion which is likely metastatic disease.

Both kidneys are unremarkable.  No renal lesions or hydronephrosis.

Stomach/Bowel: The stomach, duodenum, and small bowel are
unremarkable. No mass lesions or obstructive findings. The terminal
ileum is normal. The appendix is normal.

There is a large mass involving the right colon near the hepatic
flexure which measures approximately 4.4 x 3.7 cm. There is tumor
extending outside the colonic wall and invading the omental fat and
also abuts the gallbladder. There is associated bulky and extensive
periportal, celiac axis, gastrohepatic ligament and hepatic duodenum
ligament lymphadenopathy. There is also bulky retroperitoneal
adenopathy. Necrotic appearing large nodal mass surrounding the
portal vein and hepatic artery measures 5.4 x 3.3 cm on image number
59. The portal vein is not occluded but significantly compressed.
The splenic vein is patent. Index left-sided retroperitoneal lymph
node on image number 67 measures 20 x 14 mm.

The rectosigmoid lesions seen in sigmoidoscopy is best demonstrated
on the coronal reformatted images on image number 99. This measures
approximately 2.5 x 2.5 cm. No obstruction.

Vascular/Lymphatic: Woke E adenopathy as described above. The aorta
and branch vessels are patent. Moderate atherosclerotic
calcifications. No aneurysm or dissection.

Reproductive: There is a large mixed fatty lesion projecting off of
the posterior myometrium most likely the degenerated fibroid with
fatty change. I believe I can identify both ovaries as separate from
this lesion so I do not think this is an ovarian dermoid. The lesion
measures 8.9 x 8.1 cm and there is significant mass effect on the
uterus and endometrium. The ovaries appear normal.

Other: Small amount of free pelvic fluid. No pelvic lymphadenopathy.
No inguinal lymphadenopathy. Small periumbilical abdominal wall
hernia. No subcutaneous lesions.

Musculoskeletal: No significant bony findings. Advanced degenerative
disc disease noted at L3-4.
IMPRESSION: 1. Large mass involving the hepatic flexure region of colon with CT
evidence of direct invasion of the adjacent omentum and associated
bulky abdominal lymphadenopathy, hepatic metastatic disease and
pulmonary metastatic disease.
2. A second colonic lesion is noted at the rectosigmoid junction as
demonstrated on the colonoscopy. This measures 2.5 x 2.5 cm.
3. Large uterine fibroid with fatty degeneration.
4. Bulky periportal lymphadenopathy is compressing the portal vein
with potential risk of portal vein occlusion. Early portal venous
collaterals are noted.
